# Patient Record
Sex: Female | Born: 1955 | Race: White | Hispanic: No | Marital: Married | Smoking: Current every day smoker
Health system: Southern US, Community
[De-identification: ages and names within clinical notes are randomized; demographics above are authoritative.]

## PROBLEM LIST (undated history)

## (undated) DIAGNOSIS — F329 Major depressive disorder, single episode, unspecified: Secondary | ICD-10-CM

## (undated) DIAGNOSIS — F32A Depression, unspecified: Secondary | ICD-10-CM

## (undated) DIAGNOSIS — J449 Chronic obstructive pulmonary disease, unspecified: Secondary | ICD-10-CM

## (undated) DIAGNOSIS — I10 Essential (primary) hypertension: Secondary | ICD-10-CM

## (undated) HISTORY — PX: TONSILLECTOMY: SUR1361

## (undated) HISTORY — PX: CHOLECYSTECTOMY: SHX55

---

## 1999-04-24 ENCOUNTER — Other Ambulatory Visit: Admission: RE | Admit: 1999-04-24 | Discharge: 1999-04-24 | Payer: Self-pay | Admitting: *Deleted

## 2000-03-23 ENCOUNTER — Other Ambulatory Visit: Admission: RE | Admit: 2000-03-23 | Discharge: 2000-03-23 | Payer: Self-pay | Admitting: *Deleted

## 2001-04-13 ENCOUNTER — Other Ambulatory Visit: Admission: RE | Admit: 2001-04-13 | Discharge: 2001-04-13 | Payer: Self-pay | Admitting: *Deleted

## 2002-08-22 ENCOUNTER — Emergency Department (HOSPITAL_COMMUNITY): Admission: EM | Admit: 2002-08-22 | Discharge: 2002-08-22 | Payer: Self-pay

## 2002-09-01 ENCOUNTER — Encounter: Payer: Self-pay | Admitting: *Deleted

## 2002-09-05 ENCOUNTER — Observation Stay (HOSPITAL_COMMUNITY): Admission: RE | Admit: 2002-09-05 | Discharge: 2002-09-05 | Payer: Self-pay | Admitting: *Deleted

## 2002-09-05 ENCOUNTER — Encounter: Payer: Self-pay | Admitting: *Deleted

## 2010-08-17 ENCOUNTER — Emergency Department (HOSPITAL_COMMUNITY)
Admission: EM | Admit: 2010-08-17 | Discharge: 2010-08-17 | Payer: Self-pay | Source: Home / Self Care | Admitting: Emergency Medicine

## 2016-06-28 ENCOUNTER — Ambulatory Visit (HOSPITAL_COMMUNITY)
Admission: EM | Admit: 2016-06-28 | Discharge: 2016-06-28 | Disposition: A | Discharge status: Home / Self Care | Payer: Self-pay | Attending: Family Medicine | Admitting: Family Medicine

## 2016-06-28 ENCOUNTER — Ambulatory Visit (INDEPENDENT_AMBULATORY_CARE_PROVIDER_SITE_OTHER): Payer: Self-pay

## 2016-06-28 ENCOUNTER — Encounter (HOSPITAL_COMMUNITY): Payer: Self-pay | Admitting: Emergency Medicine

## 2016-06-28 DIAGNOSIS — B9689 Other specified bacterial agents as the cause of diseases classified elsewhere: Secondary | ICD-10-CM

## 2016-06-28 DIAGNOSIS — J208 Acute bronchitis due to other specified organisms: Secondary | ICD-10-CM

## 2016-06-28 HISTORY — DX: Depression, unspecified: F32.A

## 2016-06-28 HISTORY — DX: Major depressive disorder, single episode, unspecified: F32.9

## 2016-06-28 MED ORDER — CEFUROXIME AXETIL 250 MG PO TABS: 250.0000 mg | ORAL_TABLET | Freq: Two times a day (BID) | ORAL | 0 refills | Status: DC

## 2016-06-28 NOTE — ED Triage Notes (Signed)
Woke Tuesday feeling terrible.  Patient reports sob, stuffy head, dizzy, nauseated, poor appetite.  Patient doubts she has had a fever

## 2016-06-28 NOTE — Discharge Instructions (Signed)
Take all of medicine, drink lots of fluids, no more smoking, see your doctor if further problems , use delsym and mucinex for cough.

## 2016-06-28 NOTE — ED Provider Notes (Signed)
[facility]    CSN: [id number] Arrival date & time: 06/28/16  1219     History   Chief Complaint Chief Complaint  Patient presents with  . URI    HPI Tammy Cowan is a 60 y.o. female.   The history is provided by the patient.  URI  Presenting symptoms: congestion, cough, rhinorrhea and sore throat   Presenting symptoms: no fever   Severity:  Moderate Onset quality:  Sudden Duration:  4 days Progression:  Unchanged Chronicity:  New Relieved by:  Nothing Exacerbated by: smoker 2 ppd. Ineffective treatments:  None tried   Past Medical History:  Diagnosis Date  . Depression     There are no active problems to display for this patient.   Past Surgical History:  Procedure Laterality Date  . CHOLECYSTECTOMY    . TONSILLECTOMY      OB History    No data available       Home Medications    Prior to Admission medications   Medication Sig Start Date End Date Taking? Authorizing Provider  guaiFENesin (MUCINEX) 600 MG 12 hr tablet Take by mouth 2 (two) times daily.   Yes Historical Provider, MD  cefUROXime (CEFTIN) 250 MG tablet Take 1 tablet (250 mg total) by mouth 2 (two) times daily. 06/28/16   Linna HoffJames D Shyna Duignan, MD    Family History History reviewed. No pertinent family history.  Social History Social History  Substance Use Topics  . Smoking status: Current Every Day Smoker  . Smokeless tobacco: Never Used  . Alcohol use Yes     Allergies   Patient has no known allergies.   Review of Systems Review of Systems  Constitutional: Positive for activity change and appetite change. Negative for fever.  HENT: Positive for congestion, postnasal drip, rhinorrhea and sore throat.   Respiratory: Positive for cough and shortness of breath.   Gastrointestinal: Positive for nausea.  Skin: Negative.      Physical Exam Triage Vital Signs ED Triage Vitals  Enc Vitals Group     BP 06/28/16 1254 (!) 156/108     Pulse Rate 06/28/16 1254 78   Resp 06/28/16 1254 (!) 28     Temp 06/28/16 1254 98.1 F (36.7 C)     Temp Source 06/28/16 1254 Oral     SpO2 06/28/16 1254 95 %     Weight --      Height --      Head Circumference --      Peak Flow --      Pain Score 06/28/16 1253 3     Pain Loc --      Pain Edu? --      Excl. in GC? --    No data found.   Updated Vital Signs BP (!) 156/108 (BP Location: Right Arm) Comment: regular cuff.  reports alot of stress in life  Pulse 78   Temp 98.1 F (36.7 C) (Oral)   Resp (!) 28   SpO2 95%   Visual Acuity Right Eye Distance:   Left Eye Distance:   Bilateral Distance:    Right Eye Near:   Left Eye Near:    Bilateral Near:     Physical Exam  Constitutional: She is oriented to person, place, and time. She appears well-developed and well-nourished. No distress.  HENT:  Right Ear: External ear normal.  Left Ear: External ear normal.  Nose: Nose normal.  Mouth/Throat: Oropharynx is clear and moist.  Neck: Normal range of motion.  Neck supple.  Cardiovascular: Normal rate, regular rhythm, normal heart sounds and intact distal pulses.   Pulmonary/Chest: She has wheezes.  Neurological: She is alert and oriented to person, place, and time.  Skin: Skin is warm and dry.  Nursing note and vitals reviewed.    UC Treatments / Results  Labs (all labs ordered are listed, but only abnormal results are displayed) Labs Reviewed - No data to display  EKG  EKG Interpretation None       Radiology Dg Chest 2 View  Result Date: 06/28/2016 CLINICAL DATA:  Cough, productive.  Shortness of breath. EXAM: CHEST  2 VIEW COMPARISON:  None. FINDINGS: Heart size and mediastinal contours are normal. Mildly coarsened interstitial markings. Lungs otherwise clear. No pleural effusion or pneumothorax seen. Osseous and soft tissue structures about the chest are unremarkable. IMPRESSION: No active cardiopulmonary disease. No evidence of pneumonia or pulmonary edema. Electronically Signed   By:  Bary RichardStan  Maynard M.D.   On: 06/28/2016 13:31   X-rays reviewed and report per radiologist.  Procedures Procedures (including critical care time)  Medications Ordered in UC Medications - No data to display   Initial Impression / Assessment and Plan / UC Course  I have reviewed the triage vital signs and the nursing notes.  Pertinent labs & imaging results that were available during my care of the patient were reviewed by me and considered in my medical decision making (see chart for details).  Clinical Course       Final Clinical Impressions(s) / UC Diagnoses   Final diagnoses:  Acute bronchitis, bacterial    New Prescriptions Discharge Medication List as of 06/28/2016  1:43 PM    START taking these medications   Details  cefUROXime (CEFTIN) 250 MG tablet Take 1 tablet (250 mg total) by mouth 2 (two) times daily., Starting Sat 06/28/2016, Print         Linna HoffJames D Kassidi Elza, MD 06/29/16 2042

## 2017-04-21 IMAGING — DX DG CHEST 2V
2 series · 2 of 2 positions shown · non-contrast
Comparison: None.

CLINICAL DATA: Cough, productive.  Shortness of breath.

EXAM:
CHEST  2 VIEW

[chest pa]
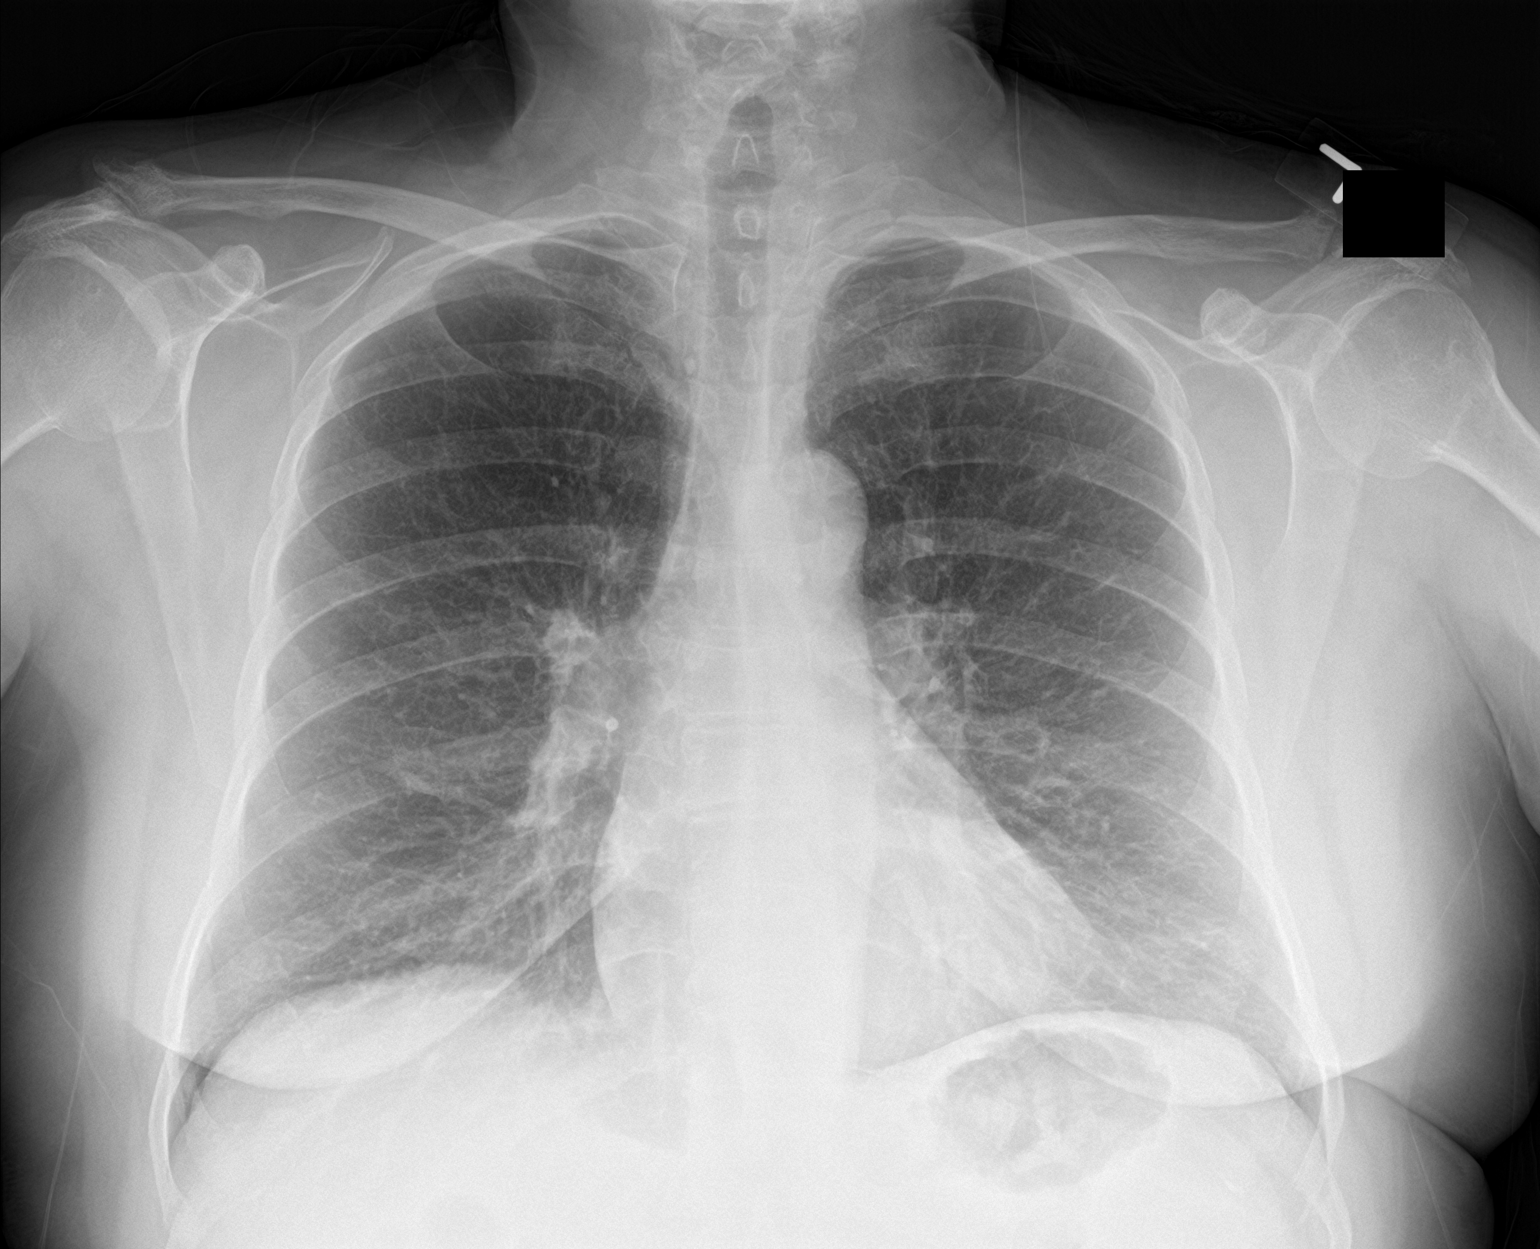

[chest lat]
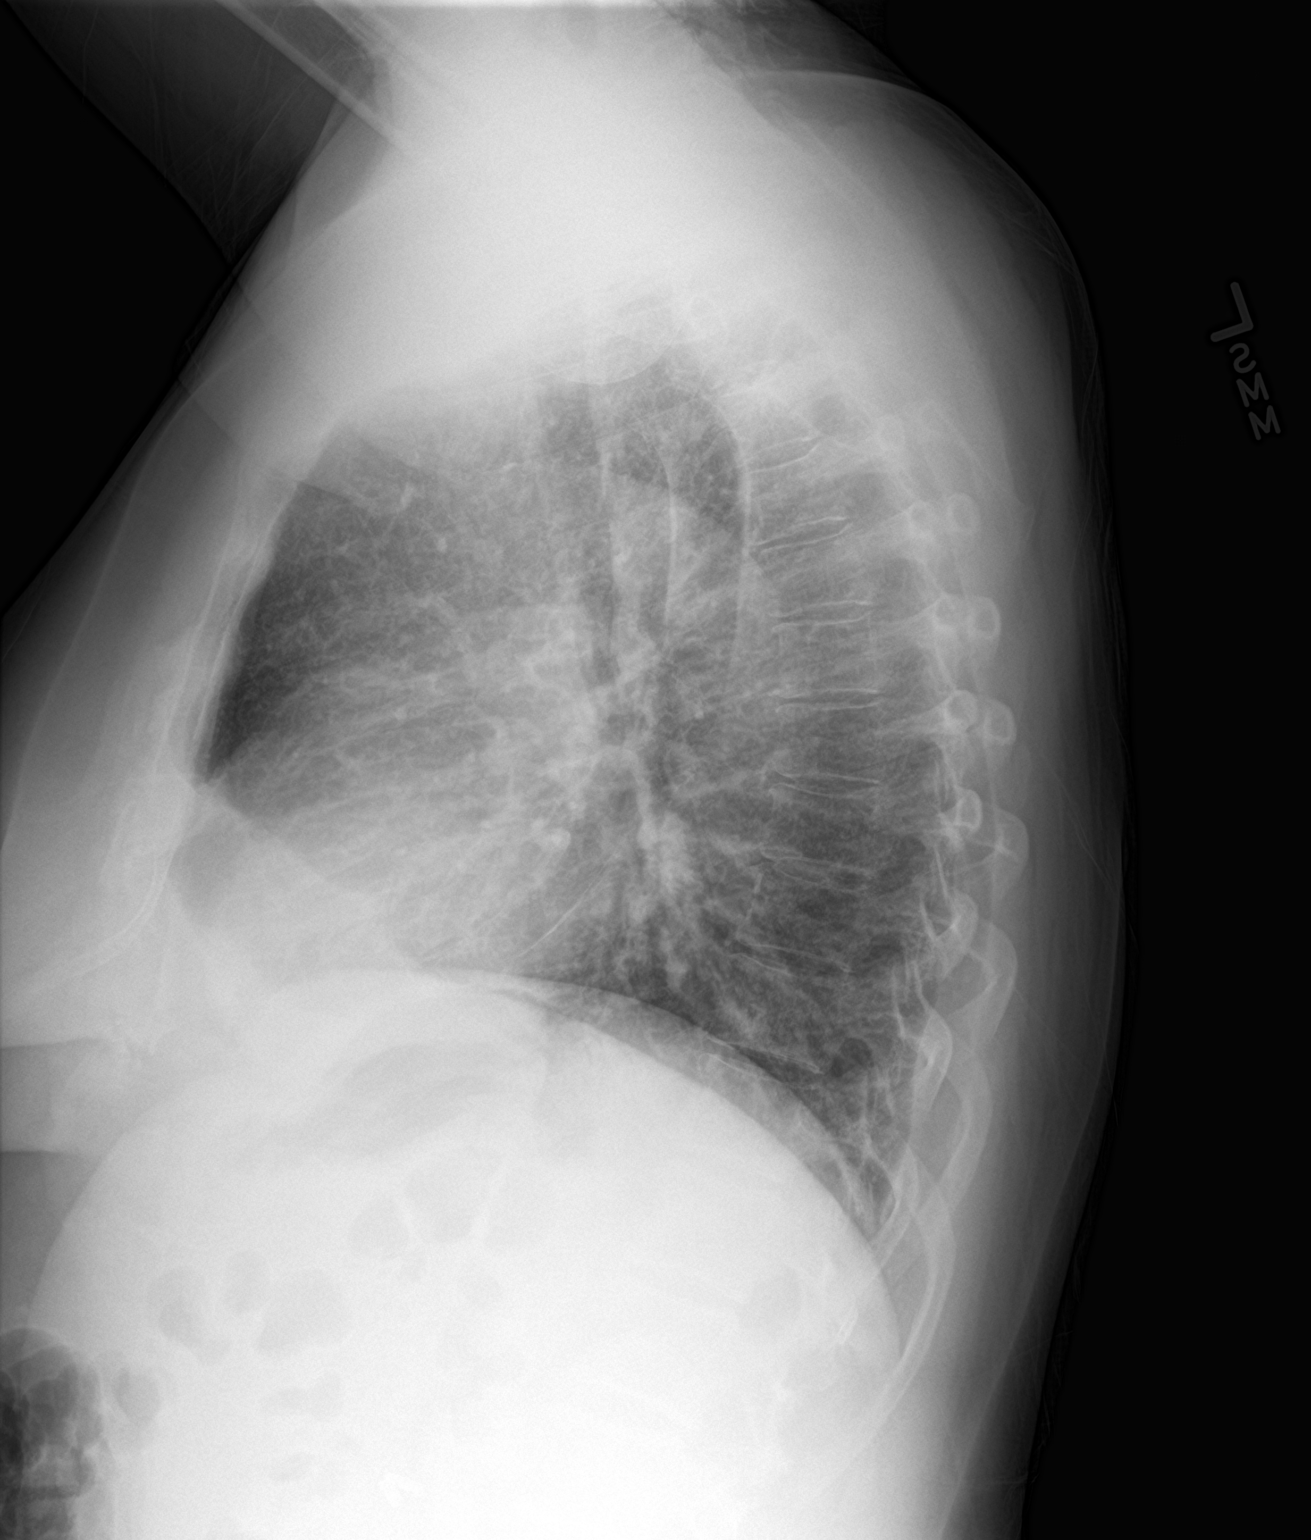

[2 of 2 positions shown; findings below may reference images not displayed]

FINDINGS: Heart size and mediastinal contours are normal. Mildly coarsened
interstitial markings. Lungs otherwise clear. No pleural effusion or
pneumothorax seen. Osseous and soft tissue structures about the
chest are unremarkable.
IMPRESSION: No active cardiopulmonary disease. No evidence of pneumonia or
pulmonary edema.

## 2018-04-21 ENCOUNTER — Encounter (HOSPITAL_COMMUNITY): Payer: Self-pay

## 2018-04-21 ENCOUNTER — Ambulatory Visit (HOSPITAL_COMMUNITY)
Admission: EM | Admit: 2018-04-21 | Discharge: 2018-04-21 | Disposition: A | Discharge status: Home / Self Care | Payer: Self-pay | Attending: Family Medicine | Admitting: Family Medicine

## 2018-04-21 DIAGNOSIS — J441 Chronic obstructive pulmonary disease with (acute) exacerbation: Secondary | ICD-10-CM

## 2018-04-21 DIAGNOSIS — J40 Bronchitis, not specified as acute or chronic: Secondary | ICD-10-CM

## 2018-04-21 MED ORDER — IPRATROPIUM-ALBUTEROL 0.5-2.5 (3) MG/3ML IN SOLN
3.0000 mL | Freq: Once | RESPIRATORY_TRACT | Status: AC
  Administered 2018-04-21: 3 mL via RESPIRATORY_TRACT

## 2018-04-21 MED ORDER — IPRATROPIUM-ALBUTEROL 0.5-2.5 (3) MG/3ML IN SOLN
RESPIRATORY_TRACT | Status: AC
  Filled 2018-04-21: qty 3

## 2018-04-21 MED ORDER — METHYLPREDNISOLONE SODIUM SUCC 125 MG IJ SOLR
INTRAMUSCULAR | Status: AC
  Filled 2018-04-21: qty 2

## 2018-04-21 MED ORDER — METHYLPREDNISOLONE SODIUM SUCC 125 MG IJ SOLR
80.0000 mg | Freq: Once | INTRAMUSCULAR | Status: AC
  Administered 2018-04-21: 80 mg via INTRAMUSCULAR

## 2018-04-21 MED ORDER — DOXYCYCLINE HYCLATE 100 MG PO CAPS: 100.0000 mg | ORAL_CAPSULE | Freq: Two times a day (BID) | ORAL | 0 refills | Status: DC

## 2018-04-21 MED ORDER — PREDNISONE 50 MG PO TABS: 50.0000 mg | ORAL_TABLET | Freq: Every day | ORAL | 0 refills | Status: DC

## 2018-04-21 NOTE — Discharge Instructions (Signed)
Start prednisone and doxycycline as directed. Continue albuterol as needed every 4-6 hours for shortness of breath, wheezing.  Keep hydrated, your urine should be clear to pale yellow in color. Tylenol/motrin for fever and pain. Monitor for any worsening of symptoms, chest pain, shortness of breath, wheezing, swelling of the throat, follow up for reevaluation.

## 2018-04-21 NOTE — ED Triage Notes (Signed)
Pt presents with ongoing persistent cough. 

## 2018-04-21 NOTE — ED Provider Notes (Signed)
[facility]    CSN: [id number] Arrival date & time: 04/21/18  1635     History   Chief Complaint Chief Complaint  Patient presents with  . Cough    HPI Tammy Cowan is a 62 y.o. female.   62 year old female for 4-day history of URI symptoms.  Productive cough, rhinorrhea, nasal congestion, frontal headache.  Sore throat and chest soreness from cough.  Denies fever, chills, night sweats.  Shortness of breath and wheezing that is slightly improved with albuterol inhaler.  Start taking anything else for the symptoms.  Current everyday smoker, 45-pack-year history.     Past Medical History:  Diagnosis Date  . Depression     There are no active problems to display for this patient.   Past Surgical History:  Procedure Laterality Date  . CHOLECYSTECTOMY    . TONSILLECTOMY      OB History   None      Home Medications    Prior to Admission medications   Medication Sig Start Date End Date Taking? Authorizing Provider  doxycycline (VIBRAMYCIN) 100 MG capsule Take 1 capsule (100 mg total) by mouth 2 (two) times daily. 04/21/18   Cathie HoopsYu, Amy V, PA-C  predniSONE (DELTASONE) 50 MG tablet Take 1 tablet (50 mg total) by mouth daily. 04/21/18   Belinda FisherYu, Amy V, PA-C    Family History History reviewed. No pertinent family history.  Social History Social History   Tobacco Use  . Smoking status: Current Every Day Smoker  . Smokeless tobacco: Never Used  Substance Use Topics  . Alcohol use: Yes  . Drug use: No     Allergies   Patient has no known allergies.   Review of Systems Review of Systems  Reason unable to perform ROS: See HPI as above.     Physical Exam Triage Vital Signs ED Triage Vitals [04/21/18 1642]  Enc Vitals Group     BP (!) 162/85     Pulse Rate 82     Resp 18     Temp 98 F (36.7 C)     Temp Source Temporal     SpO2 93 %     Weight      Height      Head Circumference      Peak Flow      Pain Score      Pain Loc      Pain Edu?       Excl. in GC?    No data found.  Updated Vital Signs BP (!) 162/85 (BP Location: Right Arm)   Pulse 82   Temp 98 F (36.7 C) (Temporal)   Resp 18   SpO2 93%   Physical Exam  Constitutional: She is oriented to person, place, and time. She appears well-developed and well-nourished. No distress.  HENT:  Head: Normocephalic and atraumatic.  Right Ear: Tympanic membrane, external ear and ear canal normal. Tympanic membrane is not erythematous and not bulging.  Left Ear: Tympanic membrane, external ear and ear canal normal. Tympanic membrane is not erythematous and not bulging.  Nose: Nose normal. Right sinus exhibits no maxillary sinus tenderness and no frontal sinus tenderness. Left sinus exhibits no maxillary sinus tenderness and no frontal sinus tenderness.  Mouth/Throat: Uvula is midline, oropharynx is clear and moist and mucous membranes are normal.  Eyes: Pupils are equal, round, and reactive to light. Conjunctivae are normal.  Neck: Normal range of motion. Neck supple.  Cardiovascular: Normal rate, regular rhythm and normal heart  sounds. Exam reveals no gallop and no friction rub.  No murmur heard. Pulmonary/Chest: Effort normal. No accessory muscle usage. No respiratory distress.  Patient talking in full sentences, no increased in effort of breathing.  Lungs with decreased air movement throughout with mild inspiratory and expiratory wheezing to the periphery.  Lymphadenopathy:    She has no cervical adenopathy.  Neurological: She is alert and oriented to person, place, and time.  Skin: Skin is warm and dry.  Psychiatric: She has a normal mood and affect. Her behavior is normal. Judgment normal.     UC Treatments / Results  Labs (all labs ordered are listed, but only abnormal results are displayed) Labs Reviewed - No data to display  EKG None  Radiology No results found.  Procedures Procedures (including critical care time)  Medications Ordered in  UC Medications  ipratropium-albuterol (DUONEB) 0.5-2.5 (3) MG/3ML nebulizer solution 3 mL (3 mLs Nebulization Given 04/21/18 1730)  ipratropium-albuterol (DUONEB) 0.5-2.5 (3) MG/3ML nebulizer solution 3 mL (3 mLs Nebulization Given 04/21/18 1756)  methylPREDNISolone sodium succinate (SOLU-MEDROL) 125 mg/2 mL injection 80 mg (80 mg Intramuscular Given 04/21/18 1756)    Initial Impression / Assessment and Plan / UC Course  I have reviewed the triage vital signs and the nursing notes.  Pertinent labs & imaging results that were available during my care of the patient were reviewed by me and considered in my medical decision making (see chart for details).    Patient with mild improvement of symptoms post DuoNeb x1.  Better air movement with increased inspiratory and expiratory wheezing.  O2 saturation improved to 95% on room air.  Patient with much improved symptoms post DuoNeb x2.  Lungs clear to auscultation bilaterally without adventitious lung sounds.  O2 saturation to 98% on room air.  Will treat for COPD exacerbation.  Solu-Medrol injection in office today.  Start doxycycline and prednisone as directed.  Continue albuterol as needed for shortness of breath and wheezing.  Push fluids.  Smoking cessation discussed.  Strict return precautions given.  Patient expresses understanding and agrees to plan.  Final Clinical Impressions(s) / UC Diagnoses   Final diagnoses:  Bronchitis    ED Prescriptions    Medication Sig Dispense Auth. Provider   predniSONE (DELTASONE) 50 MG tablet Take 1 tablet (50 mg total) by mouth daily. 5 tablet Yu, Amy V, PA-C   doxycycline (VIBRAMYCIN) 100 MG capsule Take 1 capsule (100 mg total) by mouth 2 (two) times daily. 20 capsule Threasa Alpha, [place] 04/21/18 [phone]

## 2018-08-03 ENCOUNTER — Encounter (HOSPITAL_COMMUNITY): Payer: Self-pay

## 2018-08-03 ENCOUNTER — Ambulatory Visit (HOSPITAL_COMMUNITY)
Admission: EM | Admit: 2018-08-03 | Discharge: 2018-08-03 | Disposition: A | Discharge status: Home / Self Care | Payer: BLUE CROSS/BLUE SHIELD | Attending: Family Medicine | Admitting: Family Medicine

## 2018-08-03 ENCOUNTER — Ambulatory Visit (INDEPENDENT_AMBULATORY_CARE_PROVIDER_SITE_OTHER): Payer: BLUE CROSS/BLUE SHIELD

## 2018-08-03 DIAGNOSIS — J40 Bronchitis, not specified as acute or chronic: Secondary | ICD-10-CM | POA: Insufficient documentation

## 2018-08-03 DIAGNOSIS — J441 Chronic obstructive pulmonary disease with (acute) exacerbation: Secondary | ICD-10-CM | POA: Insufficient documentation

## 2018-08-03 MED ORDER — ALBUTEROL SULFATE HFA 108 (90 BASE) MCG/ACT IN AERS
INHALATION_SPRAY | RESPIRATORY_TRACT | Status: AC
  Filled 2018-08-03: qty 6.7

## 2018-08-03 MED ORDER — AZITHROMYCIN 250 MG PO TABS: 250.0000 mg | ORAL_TABLET | Freq: Every day | ORAL | 0 refills | Status: DC

## 2018-08-03 MED ORDER — ALBUTEROL SULFATE HFA 108 (90 BASE) MCG/ACT IN AERS
2.0000 | INHALATION_SPRAY | Freq: Once | RESPIRATORY_TRACT | Status: AC
  Administered 2018-08-03: 2 via RESPIRATORY_TRACT

## 2018-08-03 MED ORDER — PREDNISONE 20 MG PO TABS: 20.0000 mg | ORAL_TABLET | Freq: Two times a day (BID) | ORAL | 0 refills | Status: AC

## 2018-08-03 NOTE — Discharge Instructions (Signed)
Declines duo-neb given in office Inhaler given in office for shortness of breath or wheezing Get plenty of rest and push fluids Prednisone prescribed.  Take as directed and to completion Azithromycin prescribed.  Take as directed and to completion [facility] and/or if symptoms persists Return or go to ER if you have any new or worsening symptoms such as fever, chills, fatigue, shortness of breath, wheezing, chest pain, nausea, changes in bowel or bladder habits, etc...Marland Kitchen

## 2018-08-03 NOTE — ED Provider Notes (Signed)
[facility]   [id number] 08/03/18 Arrival Time: 0807   CC: URI symptoms   SUBJECTIVE: History from: patient.  Royden PurlDenise C Kanouse is a 62 y.o. female who presents with  nasal congestion and productive cough x 1 week.  Admits to possible sick exposure at work.  Describes cough as intermittent and productive with yellow sputum.  Has tried breathing treatments and mucinex at home with minimal relief.  Symptoms are made worse at night.  Reports previous symptoms in the past and diagnosed with bronchitis. Complains of associated SOB and wheezing. Denies fever, chills, fatigue, sinus pain, rhinorrhea, sore throat, chest pain, nausea, changes in bowel or bladder habits.    Admits to tobacco use 0.5 - 1 PPD x 40+ years  ROS: As per HPI.  Past Medical History:  Diagnosis Date  . Depression    Past Surgical History:  Procedure Laterality Date  . CHOLECYSTECTOMY    . TONSILLECTOMY     No Known Allergies No current facility-administered medications on file prior to encounter.    No current outpatient medications on file prior to encounter.   Social History   Socioeconomic History  . Marital status: Single    Spouse name: Not on file  . Number of children: Not on file  . Years of education: Not on file  . Highest education level: Not on file  Occupational History  . Not on file  Social Needs  . Financial resource strain: Not on file  . Food insecurity:    Worry: Not on file    Inability: Not on file  . Transportation needs:    Medical: Not on file    Non-medical: Not on file  Tobacco Use  . Smoking status: Current Every Day Smoker  . Smokeless tobacco: Never Used  Substance and Sexual Activity  . Alcohol use: Yes  . Drug use: No  . Sexual activity: Not on file  Lifestyle  . Physical activity:    Days per week: Not on file    Minutes per session: Not on file  . Stress: Not on file  Relationships  . Social connections:    Talks on phone: Not on file    Gets  together: Not on file    Attends religious service: Not on file    Active member of club or organization: Not on file    Attends meetings of clubs or organizations: Not on file    Relationship status: Not on file  . Intimate partner violence:    Fear of current or ex partner: Not on file    Emotionally abused: Not on file    Physically abused: Not on file    Forced sexual activity: Not on file  Other Topics Concern  . Not on file  Social History Narrative  . Not on file   History reviewed. No pertinent family history.  OBJECTIVE:  Vitals:   08/03/18 0825  BP: (!) 158/96  Pulse: 85  Resp: 18  Temp: (!) 97.5 F (36.4 C)  TempSrc: Oral  SpO2: 99%     General appearance: alert; appears mildly fatigued, but nontoxic; speaking in full sentences and tolerating own secretions HEENT: NCAT; Ears: EACs clear, TMs pearly gray; Eyes: PERRL.  EOM grossly intact. Sinuses: nontender; Nose: nares patent without rhinorrhea, Throat: oropharynx clear, tonsils non erythematous or enlarged, uvula midline  Neck: supple without LAD Lungs: unlabored respirations, symmetrical air entry; cough: mild; no respiratory distress; CTAB; decreased breath sounds throughout Heart: regular rate and rhythm.  Radial pulses 2+ symmetrical bilaterally Skin: warm and dry Psychological: alert and cooperative; normal mood and affect  DIAGNOSTIC STUDIES:  Dg Chest 2 View  Result Date: 08/03/2018 CLINICAL DATA:  Cough, sore throat, headache and fatigue for 5 days. EXAM: CHEST - 2 VIEW COMPARISON:  06/28/2016 FINDINGS: The cardiac silhouette, mediastinal and hilar contours are normal in stable. There are mild chronic bronchitic type interstitial changes, likely related to smoking. Could not exclude the possibility of superimposed bronchitis. No infiltrates, edema or effusions. Streaky areas of bibasilar atelectasis. No worrisome pulmonary lesions. The bony thorax is intact. IMPRESSION: Chronic bronchitic type  interstitial changes, likely related to smoking. Could not exclude superimposed bronchitis but I do not see any infiltrates or effusions. Electronically Signed   By: Rudie MeyerP.  Gallerani M.D.   On: 08/03/2018 09:13     ASSESSMENT & PLAN:  1. Bronchitis   2. COPD exacerbation (HCC)     Meds ordered this encounter  Medications  . predniSONE (DELTASONE) 20 MG tablet    Sig: Take 1 tablet (20 mg total) by mouth 2 (two) times daily with a meal for 5 days.    Dispense:  10 tablet    Refill:  0    Order Specific Question:   Supervising Provider    Answer:   Eustace MooreNELSON, YVONNE SUE [[id number]]  . azithromycin (ZITHROMAX) 250 MG tablet    Sig: Take 1 tablet (250 mg total) by mouth daily. Take first 2 tablets together, then 1 every day until finished.    Dispense:  6 tablet    Refill:  0    Order Specific Question:   Supervising Provider    Answer:   Eustace MooreNELSON, YVONNE SUE [[id number]]  . albuterol (PROVENTIL HFA;VENTOLIN HFA) 108 (90 Base) MCG/ACT inhaler 2 puff   Declines duo-neb given in office.  Will do breathing treatment at home Inhaler given in office for shortness of breath or wheezing Get plenty of rest and push fluids Prednisone prescribed.  Take as directed and to completion Azithromycin prescribed.  Take as directed and to completion [facility]CP [facility] for rec[facility]ck and/or if symptoms persists Return or go to ER if you have any new or worsening symptoms such as fever, chills, fatigue, shortness of breath, wheezing, chest pain, nausea, changes in bowel or bladder habits, etc...    Reviewed expectations re: course of current medical issues. Questions answered. Outlined signs and symptoms indicating need for more acute intervention. Patient verbalized understanding. After Visit Summary given.         Rennis HardingWurst, Rosselyn Martha, PA-C 08/03/18 1011

## 2018-08-03 NOTE — ED Triage Notes (Signed)
Pt presents coughing and nasal congestion. Symptoms started 1 week ago.

## 2019-07-19 ENCOUNTER — Telehealth: Payer: Self-pay | Admitting: Oncology

## 2019-07-19 NOTE — Telephone Encounter (Signed)
A new patient appt has been scheduled for Tammy Cowan to see Dr. Alen Blew on 12/30 at 11am. Pt aware to arrive 15 minutes early.

## 2019-08-03 ENCOUNTER — Other Ambulatory Visit: Payer: Self-pay

## 2019-08-03 ENCOUNTER — Inpatient Hospital Stay: Payer: BC Managed Care – PPO | Attending: Oncology | Admitting: Oncology

## 2019-08-03 VITALS — BP 140/79 | HR 88 | Temp 98.0°F | Resp 18 | Ht 62.0 in | Wt 185.7 lb

## 2019-08-03 DIAGNOSIS — D751 Secondary polycythemia: Secondary | ICD-10-CM

## 2019-08-03 NOTE — Progress Notes (Signed)
Reason for the request:    Polycythemia  HPI: I was asked by Vicenta Aly, FNP for the evaluation of elevated ferritin and hemoglobin.  She is 63 year old woman with history of depression and hypertension who had a routine labs on November 2020.  At that time her hemoglobin was 17.9, white cell count was 8.5 and platelet count of 268.  Her differential was within normal range.  She had normal chemistries, liver function tests as well as kidney function test including BUN and creatinine.  Her ferritin was elevated 171 with the upper limit of normal of 150.  She reports being under a lot of stress for caring for her husband with dementia.  She does report smoking heavily close to 2 packs a day as well as heavy alcohol intake which she has cut down significantly at this time.  She denies any headaches chest pain.  She denies any thrombosis or bleeding episodes.  Her performance status and activity level remains reasonable at this time.  She continues to work full-time.    She does not report any headaches, blurry vision, syncope or seizures. Does not report any fevers, chills or sweats.  Does not report any cough, wheezing or hemoptysis.  Does not report any chest pain, palpitation, orthopnea or leg edema.  Does not report any nausea, vomiting or abdominal pain.  Does not report any constipation or diarrhea.  Does not report any skeletal complaints.    Does not report frequency, urgency or hematuria.  Does not report any skin rashes or lesions. Does not report any heat or cold intolerance.  Does not report any lymphadenopathy or petechiae.  Does not report any anxiety or depression.  Remaining review of systems is negative.    Past Medical History:  Diagnosis Date  . Depression   :  Past Surgical History:  Procedure Laterality Date  . CHOLECYSTECTOMY    . TONSILLECTOMY    :   Current Outpatient Medications:  .  azithromycin (ZITHROMAX) 250 MG tablet, Take 1 tablet (250 mg total) by mouth  daily. Take first 2 tablets together, then 1 every day until finished., Disp: 6 tablet, Rfl: 0:  No Known Allergies:  No family history on file.:  Social History   Socioeconomic History  . Marital status: Single    Spouse name: Not on file  . Number of children: Not on file  . Years of education: Not on file  . Highest education level: Not on file  Occupational History  . Not on file  Tobacco Use  . Smoking status: Current Every Day Smoker  . Smokeless tobacco: Never Used  Substance and Sexual Activity  . Alcohol use: Yes  . Drug use: No  . Sexual activity: Not on file  Other Topics Concern  . Not on file  Social History Narrative  . Not on file   Social Determinants of Health   Financial Resource Strain:   . Difficulty of Paying Living Expenses: Not on file  Food Insecurity:   . Worried About Charity fundraiser in the Last Year: Not on file  . Ran Out of Food in the Last Year: Not on file  Transportation Needs:   . Lack of Transportation (Medical): Not on file  . Lack of Transportation (Non-Medical): Not on file  Physical Activity:   . Days of Exercise per Week: Not on file  . Minutes of Exercise per Session: Not on file  Stress:   . Feeling of Stress : Not on file  Social Connections:   . Frequency of Communication with Friends and Family: Not on file  . Frequency of Social Gatherings with Friends and Family: Not on file  . Attends Religious Services: Not on file  . Active Member of Clubs or Organizations: Not on file  . Attends Banker Meetings: Not on file  . Marital Status: Not on file  Intimate Partner Violence:   . Fear of Current or Ex-Partner: Not on file  . Emotionally Abused: Not on file  . Physically Abused: Not on file  . Sexually Abused: Not on file  :  Pertinent items are noted in HPI.  Exam:  Blood pressure 140/79, pulse 88, temperature 98 F (36.7 C), temperature source Temporal, resp. rate 18, height 5\' 2"  (1.575 m), weight  185 lb 11.2 oz (84.2 kg), SpO2 97 %.   ECOG 1  General appearance: alert and cooperative appeared without distress. Head: atraumatic without any abnormalities. Eyes: conjunctivae/corneas clear. PERRL.  Sclera anicteric. Throat: lips, mucosa, and tongue normal; without oral thrush or ulcers. Resp: clear to auscultation bilaterally without rhonchi, wheezes or dullness to percussion. Cardio: regular rate and rhythm, S1, S2 normal, no murmur, click, rub or gallop GI: soft, non-tender; bowel sounds normal; no masses,  no organomegaly Skin: Skin color, texture, turgor normal. No rashes or lesions Lymph nodes: Cervical, supraclavicular, and axillary nodes normal. Neurologic: Grossly normal without any motor, sensory or deep tendon reflexes. Musculoskeletal: No joint deformity or effusion.     Assessment and Plan:    63 year old woman with:  1.  Polycythemia with elevated ferritin noted on laboratory testing in November 2020.  This is in the setting of polysubstance use including increased alcohol and tobacco use.  The differential diagnosis was discussed today at this time.  Polycythemia vera, hemochromatosis or other hematological condition are considered less likely in this particular setting.  Secondary causes for elevation of ferritin and hemoglobin likely related to tobacco and alcohol use at this time.  I have recommended abstaining from alcohol and decrease on smoking as much as possible before repeat evaluation.  Work-up for hemochromatosis and polycythemia vera would be reasonable if her counts continue to be elevated.  All her questions are answered to her satisfaction.  2.  Follow-up: Will be in 6 months for repeat evaluation.   40  minutes was spent with the patient face-to-face today.  More than 50% of time was spent on reviewing her disease status, treatment options and management of her condition including future work-up plans.   Thank you for the referral. A copy of this  consult has been forwarded to the requesting provider.

## 2019-08-04 ENCOUNTER — Telehealth: Payer: Self-pay | Admitting: Oncology

## 2019-08-04 NOTE — Telephone Encounter (Signed)
Scheduled appt per 12/30 los.  Sent a message to HIM pool to get a calendar mailed out. 

## 2020-01-27 ENCOUNTER — Other Ambulatory Visit: Payer: BC Managed Care – PPO

## 2020-02-01 ENCOUNTER — Ambulatory Visit: Payer: BC Managed Care – PPO | Admitting: Oncology

## 2020-03-08 ENCOUNTER — Other Ambulatory Visit: Payer: Self-pay | Admitting: Orthopedic Surgery

## 2020-03-08 ENCOUNTER — Other Ambulatory Visit: Payer: Self-pay

## 2020-03-08 ENCOUNTER — Encounter: Payer: Self-pay | Admitting: Neurology

## 2020-03-08 DIAGNOSIS — M5412 Radiculopathy, cervical region: Secondary | ICD-10-CM

## 2020-03-08 DIAGNOSIS — G5603 Carpal tunnel syndrome, bilateral upper limbs: Secondary | ICD-10-CM

## 2020-03-08 DIAGNOSIS — M931 Kienbock's disease of adults: Secondary | ICD-10-CM

## 2020-04-02 ENCOUNTER — Ambulatory Visit
Admission: RE | Admit: 2020-04-02 | Discharge: 2020-04-02 | Disposition: A | Discharge status: Home / Self Care | Payer: BC Managed Care – PPO | Source: Ambulatory Visit | Attending: Orthopedic Surgery | Admitting: Orthopedic Surgery

## 2020-04-02 ENCOUNTER — Other Ambulatory Visit: Payer: Self-pay

## 2020-04-02 DIAGNOSIS — M931 Kienbock's disease of adults: Secondary | ICD-10-CM

## 2020-04-03 ENCOUNTER — Ambulatory Visit (INDEPENDENT_AMBULATORY_CARE_PROVIDER_SITE_OTHER): Payer: BC Managed Care – PPO | Admitting: Neurology

## 2020-04-03 DIAGNOSIS — M5412 Radiculopathy, cervical region: Secondary | ICD-10-CM

## 2020-04-03 DIAGNOSIS — G5603 Carpal tunnel syndrome, bilateral upper limbs: Secondary | ICD-10-CM | POA: Diagnosis not present

## 2020-04-03 NOTE — Procedures (Signed)
[facility]  [address] [place], [address]  [place], [place] [address] Tel: [phone] Fax:  [phone] Test Date:  04/03/2020  Patient: Tammy Cowan DOB: 1956-03-04 Physician: Nita Sickle, DO  Sex: Female Height: 5\' 2"  Ref Phys:  ID#: Monica [id number] Temp: 33.0C Technician:    Patient Complaints: This is a 64 year old female referred for evaluation of bilateral hand paresthesias, worse on the right.  NCV & EMG Findings: Extensive electrodiagnostic testing of the right upper extremity and additional studies: 1. Bilateral median sensory responses are absent.  Bilateral ulnar sensory responses are within normal limits. 2. Median motor response is absent on the right and shows prolonged latency (8.0 ms) and reduced amplitude (4.6 mV) on the left.  Bilateral ulnar motor responses are within normal limits. 3. Despite maximal activation, no motor unit recruitment is seen in the right abductor pollicis brevis muscle and chronic motor axonal loss changes are seen on the left.  Impression: Bilateral median neuropathy at or distal to the wrist, consistent with a clinical diagnosis of carpal tunnel syndrome.  Overall, these findings are very severe in degree electrically and worse on the right.   ___________________________ 77, DO    Nerve Conduction Studies Anti Sensory Summary Table   Stim Site NR Peak (ms) Norm Peak (ms) P-T Amp (V) Norm P-T Amp  Left Median Anti Sensory (2nd Digit)  35C  Wrist NR  <3.8  >10  Right Median Anti Sensory (2nd Digit)  35C  Wrist NR  <3.8  >10  Left Ulnar Anti Sensory (5th Digit)  35C  Wrist    2.4 <3.2 28.4 >5  Right Ulnar Anti Sensory (5th Digit)  35C  Wrist    2.3 <3.2 29.3 >5   Motor Summary Table   Stim Site NR Onset (ms) Norm Onset (ms) O-P Amp (mV) Norm O-P Amp Site1 Site2 Delta-0 (ms) Dist (cm) Vel (m/s) Norm Vel (m/s)  Left Median Motor (Abd Poll Brev)  35C  Wrist    8.0 <4.0 4.6 >5 Elbow Wrist 5.0 26.0  52 >50  Elbow    13.0  4.6         Right Median Motor (Abd Poll Brev)  35C  Wrist NR  <4.0  >5 Elbow Wrist  0.0  >50  Elbow NR            Left Ulnar Motor (Abd Dig Minimi)  35C  Wrist    2.0 <3.1 11.3 >7 B Elbow Wrist 3.5 22.0 63 >50  B Elbow    5.5  10.0  A Elbow B Elbow 1.8 10.0 56 >50  A Elbow    7.3  9.5         Right Ulnar Motor (Abd Dig Minimi)  35C  Wrist    2.0 <3.1 9.7 >7 B Elbow Wrist 3.6 21.0 58 >50  B Elbow    5.6  9.1  A Elbow B Elbow 1.9 10.0 53 >50  A Elbow    7.5  8.4          EMG   Side Muscle Ins Act Fibs Psw Fasc Number Recrt Dur Dur. Amp Amp. Poly Poly. Comment  Right 1stDorInt Nml Nml Nml Nml Nml Nml Nml Nml Nml Nml Nml Nml N/A  Right Abd Poll Brev Nml Nml Nml Nml None None - - - - - - ATR  Right PronatorTeres Nml Nml Nml Nml Nml Nml Nml Nml Nml Nml Nml Nml N/A  Right Biceps Nml Nml Nml  Nml Nml Nml Nml Nml Nml Nml Nml Nml N/A  Right Triceps Nml Nml Nml Nml Nml Nml Nml Nml Nml Nml Nml Nml N/A  Right Deltoid Nml Nml Nml Nml Nml Nml Nml Nml Nml Nml Nml Nml N/A  Left 1stDorInt Nml Nml Nml Nml Nml Nml Nml Nml Nml Nml Nml Nml N/A  Left PronatorTeres Nml Nml Nml Nml Nml Nml Nml Nml Nml Nml Nml Nml N/A  Left Abd Poll Brev Nml Nml Nml Nml 1- Rapid Some 1+ Some 1+ Nml Nml N/A  Left Biceps Nml Nml Nml Nml Nml Nml Nml Nml Nml Nml Nml Nml N/A  Left Triceps Nml Nml Nml Nml Nml Nml Nml Nml Nml Nml Nml Nml N/A  Left Deltoid Nml Nml Nml Nml Nml Nml Nml Nml Nml Nml Nml Nml N/A      Waveforms:

## 2020-04-05 ENCOUNTER — Other Ambulatory Visit: Payer: Self-pay | Admitting: Orthopedic Surgery

## 2020-05-03 ENCOUNTER — Other Ambulatory Visit: Payer: Self-pay

## 2020-05-03 ENCOUNTER — Encounter (HOSPITAL_BASED_OUTPATIENT_CLINIC_OR_DEPARTMENT_OTHER): Payer: Self-pay | Admitting: Orthopedic Surgery

## 2020-05-07 ENCOUNTER — Other Ambulatory Visit (HOSPITAL_COMMUNITY): Payer: BC Managed Care – PPO

## 2020-05-07 ENCOUNTER — Encounter (HOSPITAL_BASED_OUTPATIENT_CLINIC_OR_DEPARTMENT_OTHER)
Admission: RE | Admit: 2020-05-07 | Discharge: 2020-05-07 | Disposition: A | Discharge status: Home / Self Care | Payer: BC Managed Care – PPO | Source: Ambulatory Visit | Attending: Orthopedic Surgery | Admitting: Orthopedic Surgery

## 2020-05-07 DIAGNOSIS — Z01812 Encounter for preprocedural laboratory examination: Secondary | ICD-10-CM | POA: Diagnosis present

## 2020-05-07 NOTE — Progress Notes (Signed)

## 2020-05-08 ENCOUNTER — Other Ambulatory Visit (HOSPITAL_COMMUNITY)
Admission: RE | Admit: 2020-05-08 | Discharge: 2020-05-08 | Disposition: A | Discharge status: Home / Self Care | Payer: BC Managed Care – PPO | Source: Ambulatory Visit | Attending: Orthopedic Surgery | Admitting: Orthopedic Surgery

## 2020-05-08 DIAGNOSIS — Z20822 Contact with and (suspected) exposure to covid-19: Secondary | ICD-10-CM | POA: Diagnosis not present

## 2020-05-08 DIAGNOSIS — Z01812 Encounter for preprocedural laboratory examination: Secondary | ICD-10-CM | POA: Diagnosis not present

## 2020-05-08 LAB — SARS CORONAVIRUS 2 (TAT 6-24 HRS): SARS Coronavirus 2: NEGATIVE

## 2020-05-10 ENCOUNTER — Ambulatory Visit (HOSPITAL_BASED_OUTPATIENT_CLINIC_OR_DEPARTMENT_OTHER): Payer: BC Managed Care – PPO | Admitting: Anesthesiology

## 2020-05-10 ENCOUNTER — Encounter (HOSPITAL_BASED_OUTPATIENT_CLINIC_OR_DEPARTMENT_OTHER): Payer: Self-pay | Admitting: Orthopedic Surgery

## 2020-05-10 ENCOUNTER — Other Ambulatory Visit: Payer: Self-pay

## 2020-05-10 ENCOUNTER — Ambulatory Visit (HOSPITAL_BASED_OUTPATIENT_CLINIC_OR_DEPARTMENT_OTHER)
Admission: RE | Admit: 2020-05-10 | Discharge: 2020-05-10 | Disposition: A | Discharge status: Home / Self Care | Payer: BC Managed Care – PPO | Attending: Orthopedic Surgery | Admitting: Orthopedic Surgery

## 2020-05-10 ENCOUNTER — Encounter (HOSPITAL_BASED_OUTPATIENT_CLINIC_OR_DEPARTMENT_OTHER)
Admission: RE | Disposition: A | Discharge status: Home / Self Care | Payer: Self-pay | Source: Home / Self Care | Attending: Orthopedic Surgery

## 2020-05-10 DIAGNOSIS — F32A Depression, unspecified: Secondary | ICD-10-CM | POA: Insufficient documentation

## 2020-05-10 DIAGNOSIS — G5601 Carpal tunnel syndrome, right upper limb: Secondary | ICD-10-CM | POA: Insufficient documentation

## 2020-05-10 DIAGNOSIS — I10 Essential (primary) hypertension: Secondary | ICD-10-CM | POA: Insufficient documentation

## 2020-05-10 DIAGNOSIS — F17210 Nicotine dependence, cigarettes, uncomplicated: Secondary | ICD-10-CM | POA: Insufficient documentation

## 2020-05-10 DIAGNOSIS — Z79899 Other long term (current) drug therapy: Secondary | ICD-10-CM | POA: Diagnosis not present

## 2020-05-10 DIAGNOSIS — M8568 Other cyst of bone, other site: Secondary | ICD-10-CM | POA: Diagnosis not present

## 2020-05-10 HISTORY — PX: CYST EXCISION: SHX5701

## 2020-05-10 HISTORY — PX: CARPAL TUNNEL RELEASE: SHX101

## 2020-05-10 HISTORY — DX: Essential (primary) hypertension: I10

## 2020-05-10 SURGERY — CARPAL TUNNEL RELEASE
Anesthesia: Regional | Site: Wrist | Laterality: Right

## 2020-05-10 MED ORDER — PROPOFOL 10 MG/ML IV BOLUS
INTRAVENOUS | Status: DC | PRN
  Administered 2020-05-10: 50 mg via INTRAVENOUS

## 2020-05-10 MED ORDER — DEXAMETHASONE SODIUM PHOSPHATE 10 MG/ML IJ SOLN
INTRAMUSCULAR | Status: AC
  Filled 2020-05-10: qty 1

## 2020-05-10 MED ORDER — DIPHENHYDRAMINE HCL 50 MG/ML IJ SOLN
INTRAMUSCULAR | Status: AC
  Filled 2020-05-10: qty 1

## 2020-05-10 MED ORDER — PROPOFOL 500 MG/50ML IV EMUL
INTRAVENOUS | Status: DC | PRN
  Administered 2020-05-10: 75 ug/kg/min via INTRAVENOUS

## 2020-05-10 MED ORDER — ACETAMINOPHEN 500 MG PO TABS
ORAL_TABLET | ORAL | Status: AC
  Filled 2020-05-10: qty 2

## 2020-05-10 MED ORDER — ONDANSETRON HCL 4 MG/2ML IJ SOLN
INTRAMUSCULAR | Status: DC | PRN
  Administered 2020-05-10: 4 mg via INTRAVENOUS

## 2020-05-10 MED ORDER — BUPIVACAINE HCL (PF) 0.25 % IJ SOLN
INTRAMUSCULAR | Status: AC
  Filled 2020-05-10: qty 60

## 2020-05-10 MED ORDER — HYDROMORPHONE HCL 1 MG/ML IJ SOLN
INTRAMUSCULAR | Status: AC
Start: 2020-05-10 — End: ?
  Filled 2020-05-10: qty 0.5

## 2020-05-10 MED ORDER — MIDAZOLAM HCL 2 MG/2ML IJ SOLN
INTRAMUSCULAR | Status: AC
  Filled 2020-05-10: qty 2

## 2020-05-10 MED ORDER — LACTATED RINGERS IV SOLN: INTRAVENOUS | Status: DC

## 2020-05-10 MED ORDER — HYDROMORPHONE HCL 1 MG/ML IJ SOLN
0.2500 mg | INTRAMUSCULAR | Status: DC | PRN
  Administered 2020-05-10: 0.5 mg via INTRAVENOUS

## 2020-05-10 MED ORDER — PHENYLEPHRINE 40 MCG/ML (10ML) SYRINGE FOR IV PUSH (FOR BLOOD PRESSURE SUPPORT)
PREFILLED_SYRINGE | INTRAVENOUS | Status: AC
  Filled 2020-05-10: qty 10

## 2020-05-10 MED ORDER — DIPHENHYDRAMINE HCL 50 MG/ML IJ SOLN
INTRAMUSCULAR | Status: DC | PRN
  Administered 2020-05-10: 12.5 mg via INTRAVENOUS

## 2020-05-10 MED ORDER — KETOROLAC TROMETHAMINE 30 MG/ML IJ SOLN
INTRAMUSCULAR | Status: AC
  Filled 2020-05-10: qty 1

## 2020-05-10 MED ORDER — IPRATROPIUM-ALBUTEROL 0.5-2.5 (3) MG/3ML IN SOLN
RESPIRATORY_TRACT | Status: AC
  Filled 2020-05-10: qty 3

## 2020-05-10 MED ORDER — FENTANYL CITRATE (PF) 100 MCG/2ML IJ SOLN
INTRAMUSCULAR | Status: AC
  Filled 2020-05-10: qty 2

## 2020-05-10 MED ORDER — OXYCODONE HCL 5 MG/5ML PO SOLN: 5.0000 mg | Freq: Once | ORAL | Status: DC | PRN

## 2020-05-10 MED ORDER — FENTANYL CITRATE (PF) 100 MCG/2ML IJ SOLN
INTRAMUSCULAR | Status: DC | PRN
Start: 2020-05-10 — End: 2020-05-10
  Administered 2020-05-10 (×2): 25 ug via INTRAVENOUS

## 2020-05-10 MED ORDER — LIDOCAINE 2% (20 MG/ML) 5 ML SYRINGE
INTRAMUSCULAR | Status: AC
  Filled 2020-05-10: qty 5

## 2020-05-10 MED ORDER — MIDAZOLAM HCL 2 MG/2ML IJ SOLN
1.0000 mg | Freq: Once | INTRAMUSCULAR | Status: AC
  Administered 2020-05-10: 2 mg via INTRAVENOUS

## 2020-05-10 MED ORDER — ACETAMINOPHEN 500 MG PO TABS
1000.0000 mg | ORAL_TABLET | Freq: Once | ORAL | Status: AC
  Administered 2020-05-10: 1000 mg via ORAL

## 2020-05-10 MED ORDER — PROMETHAZINE HCL 25 MG/ML IJ SOLN: 6.2500 mg | INTRAMUSCULAR | Status: DC | PRN

## 2020-05-10 MED ORDER — OXYCODONE HCL 5 MG PO TABS: 5.0000 mg | ORAL_TABLET | Freq: Once | ORAL | Status: DC | PRN

## 2020-05-10 MED ORDER — ONDANSETRON HCL 4 MG/2ML IJ SOLN
INTRAMUSCULAR | Status: AC
  Filled 2020-05-10: qty 2

## 2020-05-10 MED ORDER — KETOROLAC TROMETHAMINE 30 MG/ML IJ SOLN
30.0000 mg | Freq: Once | INTRAMUSCULAR | Status: AC | PRN
  Administered 2020-05-10: 30 mg via INTRAVENOUS

## 2020-05-10 MED ORDER — HYDROCODONE-ACETAMINOPHEN 5-325 MG PO TABS
ORAL_TABLET | ORAL | 0 refills | Status: DC
Start: 2020-05-10 — End: 2020-07-19

## 2020-05-10 MED ORDER — IPRATROPIUM-ALBUTEROL 0.5-2.5 (3) MG/3ML IN SOLN
3.0000 mL | RESPIRATORY_TRACT | Status: DC
  Administered 2020-05-10: 3 mL via RESPIRATORY_TRACT

## 2020-05-10 MED ORDER — CEFAZOLIN SODIUM-DEXTROSE 2-4 GM/100ML-% IV SOLN
INTRAVENOUS | Status: AC
  Filled 2020-05-10: qty 100

## 2020-05-10 MED ORDER — EPHEDRINE 5 MG/ML INJ
INTRAVENOUS | Status: AC
  Filled 2020-05-10: qty 10

## 2020-05-10 MED ORDER — SUCCINYLCHOLINE CHLORIDE 200 MG/10ML IV SOSY
PREFILLED_SYRINGE | INTRAVENOUS | Status: AC
  Filled 2020-05-10: qty 10

## 2020-05-10 MED ORDER — DEXAMETHASONE SODIUM PHOSPHATE 10 MG/ML IJ SOLN
INTRAMUSCULAR | Status: DC | PRN
  Administered 2020-05-10: 5 mg via INTRAVENOUS

## 2020-05-10 MED ORDER — LIDOCAINE-EPINEPHRINE (PF) 1.5 %-1:200000 IJ SOLN
INTRAMUSCULAR | Status: DC | PRN
  Administered 2020-05-10: 30 mL via PERINEURAL

## 2020-05-10 MED ORDER — CEFAZOLIN SODIUM-DEXTROSE 2-4 GM/100ML-% IV SOLN
2.0000 g | INTRAVENOUS | Status: AC
  Administered 2020-05-10: 2 g via INTRAVENOUS

## 2020-05-10 SURGICAL SUPPLY — 59 items
APL PRP STRL LF DISP 70% ISPRP (MISCELLANEOUS) ×1
APL SKNCLS STERI-STRIP NONHPOA (GAUZE/BANDAGES/DRESSINGS)
BENZOIN TINCTURE PRP APPL 2/3 (GAUZE/BANDAGES/DRESSINGS) IMPLANT
BLADE MINI RND TIP GREEN BEAV (BLADE) IMPLANT
BLADE SURG 15 STRL LF DISP TIS (BLADE) ×2 IMPLANT
BLADE SURG 15 STRL SS (BLADE) ×4
BNDG CMPR 9X4 STRL LF SNTH (GAUZE/BANDAGES/DRESSINGS) ×1
BNDG COHESIVE 1X5 TAN STRL LF (GAUZE/BANDAGES/DRESSINGS) IMPLANT
BNDG COHESIVE 2X5 TAN STRL LF (GAUZE/BANDAGES/DRESSINGS) IMPLANT
BNDG CONFORM 2 STRL LF (GAUZE/BANDAGES/DRESSINGS) IMPLANT
BNDG ELASTIC 2X5.8 VLCR STR LF (GAUZE/BANDAGES/DRESSINGS) IMPLANT
BNDG ELASTIC 3X5.8 VLCR STR LF (GAUZE/BANDAGES/DRESSINGS) ×2 IMPLANT
BNDG ESMARK 4X9 LF (GAUZE/BANDAGES/DRESSINGS) ×2 IMPLANT
BNDG GAUZE 1X2.1 STRL (MISCELLANEOUS) IMPLANT
BNDG GAUZE ELAST 4 BULKY (GAUZE/BANDAGES/DRESSINGS) ×2 IMPLANT
BNDG PLASTER X FAST 3X3 WHT LF (CAST SUPPLIES) IMPLANT
BNDG PLSTR 9X3 FST ST WHT (CAST SUPPLIES)
BONE CHIP PRESERV 5CC PCAN5 (Bone Implant) ×2 IMPLANT
CHLORAPREP W/TINT 26 (MISCELLANEOUS) ×2 IMPLANT
CORD BIPOLAR FORCEPS 12FT (ELECTRODE) ×2 IMPLANT
COVER BACK TABLE 60X90IN (DRAPES) ×2 IMPLANT
COVER MAYO STAND STRL (DRAPES) ×2 IMPLANT
COVER WAND RF STERILE (DRAPES) IMPLANT
CUFF TOURN SGL QUICK 18X4 (TOURNIQUET CUFF) ×2 IMPLANT
DRAPE EXTREMITY T 121X128X90 (DISPOSABLE) ×2 IMPLANT
DRAPE SURG 17X23 STRL (DRAPES) ×2 IMPLANT
DRSG PAD ABDOMINAL 8X10 ST (GAUZE/BANDAGES/DRESSINGS) ×2 IMPLANT
GAUZE SPONGE 4X4 12PLY STRL (GAUZE/BANDAGES/DRESSINGS) ×2 IMPLANT
GAUZE XEROFORM 1X8 LF (GAUZE/BANDAGES/DRESSINGS) ×2 IMPLANT
GLOVE BIO SURGEON STRL SZ 6.5 (GLOVE) ×4 IMPLANT
GLOVE BIO SURGEON STRL SZ7.5 (GLOVE) ×2 IMPLANT
GLOVE BIOGEL PI IND STRL 7.0 (GLOVE) ×3 IMPLANT
GLOVE BIOGEL PI IND STRL 8 (GLOVE) ×1 IMPLANT
GLOVE BIOGEL PI IND STRL 8.5 (GLOVE) ×1 IMPLANT
GLOVE BIOGEL PI INDICATOR 7.0 (GLOVE) ×3
GLOVE BIOGEL PI INDICATOR 8 (GLOVE) ×1
GLOVE BIOGEL PI INDICATOR 8.5 (GLOVE) ×1
GLOVE SURG ORTHO 8.0 STRL STRW (GLOVE) ×2 IMPLANT
GOWN STRL REUS W/ TWL LRG LVL3 (GOWN DISPOSABLE) ×1 IMPLANT
GOWN STRL REUS W/TWL LRG LVL3 (GOWN DISPOSABLE) ×2
GOWN STRL REUS W/TWL XL LVL3 (GOWN DISPOSABLE) ×4 IMPLANT
NEEDLE HYPO 25X1 1.5 SAFETY (NEEDLE) ×2 IMPLANT
NS IRRIG 1000ML POUR BTL (IV SOLUTION) ×2 IMPLANT
PACK BASIN DAY SURGERY FS (CUSTOM PROCEDURE TRAY) ×2 IMPLANT
PAD CAST 3X4 CTTN HI CHSV (CAST SUPPLIES) IMPLANT
PAD CAST 4YDX4 CTTN HI CHSV (CAST SUPPLIES) IMPLANT
PADDING CAST ABS 4INX4YD NS (CAST SUPPLIES) ×1
PADDING CAST ABS COTTON 4X4 ST (CAST SUPPLIES) ×1 IMPLANT
PADDING CAST COTTON 3X4 STRL (CAST SUPPLIES)
PADDING CAST COTTON 4X4 STRL (CAST SUPPLIES)
SLING ARM FOAM STRAP LRG (SOFTGOODS) ×2 IMPLANT
STOCKINETTE 4X48 STRL (DRAPES) ×2 IMPLANT
STRIP CLOSURE SKIN 1/2X4 (GAUZE/BANDAGES/DRESSINGS) IMPLANT
SUT ETHILON 3 0 PS 1 (SUTURE) IMPLANT
SUT ETHILON 4 0 PS 2 18 (SUTURE) ×2 IMPLANT
SYR BULB EAR ULCER 3OZ GRN STR (SYRINGE) ×2 IMPLANT
SYR CONTROL 10ML LL (SYRINGE) ×2 IMPLANT
TOWEL GREEN STERILE FF (TOWEL DISPOSABLE) ×4 IMPLANT
UNDERPAD 30X36 HEAVY ABSORB (UNDERPADS AND DIAPERS) ×2 IMPLANT

## 2020-05-10 NOTE — Anesthesia Procedure Notes (Signed)
Anesthesia Regional Block: Supraclavicular block   Pre-Anesthetic Checklist: ,, timeout performed, Correct Patient, Correct Site, Correct Laterality, Correct Procedure, Correct Position, site marked, Risks and benefits discussed,  Surgical consent,  Pre-op evaluation,  At surgeon's request and post-op pain management  Laterality: Right  Prep: chloraprep       Needles:  Injection technique: Single-shot  Needle Type: Echogenic Stimulator Needle     Needle Length: 10cm  Needle Gauge: 20     Additional Needles:   Procedures:,,,, ultrasound used (permanent image in chart),,,,  Narrative:  Start time: 05/10/2020 10:10 AM End time: 05/10/2020 10:20 AM Injection made incrementally with aspirations every 5 mL.  Performed by: Personally  Anesthesiologist: Leonides Grills, MD  Additional Notes: Functioning IV was confirmed and monitors were applied.  A timeout was performed. Sterile prep, hand hygiene and sterile gloves were used. A 20ga BBraun echogenic stimulator needle was used. Negative aspiration and negative test dose prior to incremental administration of local anesthetic. The patient tolerated the procedure well.  Ultrasound guidance: relevent anatomy identified, needle position confirmed, local anesthetic spread visualized around nerve(s), vascular puncture avoided.  Image printed for medical record.

## 2020-05-10 NOTE — Op Note (Signed)
I assisted Surgeon(s) and Role:    * Betha Loa, MD - Primary    Cindee Salt, MD - Assisting on the Procedure(s): CARPAL TUNNEL RELEASE RIGHT LUNATE CURRETAGE CYST AND BONE GRAFTING  ALLOGRAFT on 05/10/2020.  I provided assistance on this case as follows: setup, approach, decompression median nerve, isolation lunate bone, removal of lesion, preparation and placement of the bone graft, closure and application of the dressing and splint.  Electronically signed by: Cindee Salt, MD Date: 05/10/2020 Time: 12:09 PM

## 2020-05-10 NOTE — Discharge Instructions (Addendum)

## 2020-05-10 NOTE — Anesthesia Procedure Notes (Signed)
Procedure Name: LMA Insertion Date/Time: 05/10/2020 10:58 AM Performed by: Ronnette Hila, CRNA Pre-anesthesia Checklist: Patient identified, Emergency Drugs available, Suction available and Patient being monitored Patient Re-evaluated:Patient Re-evaluated prior to induction Oxygen Delivery Method: Circle system utilized Preoxygenation: Pre-oxygenation with 100% oxygen Induction Type: IV induction Ventilation: Mask ventilation without difficulty LMA: LMA inserted LMA Size: 4.0 Number of attempts: 1 Airway Equipment and Method: Bite block Placement Confirmation: positive ETCO2 Tube secured with: Tape Dental Injury: Teeth and Oropharynx as per pre-operative assessment

## 2020-05-10 NOTE — Anesthesia Postprocedure Evaluation (Signed)
Anesthesia Post Note  Patient: Tammy Cowan  Procedure(s) Performed: CARPAL TUNNEL RELEASE (Right Wrist) RIGHT LUNATE CURRETAGE CYST AND BONE GRAFTING  ALLOGRAFT (Right Wrist)     Patient location during evaluation: PACU Anesthesia Type: Regional Level of consciousness: awake and alert Pain management: pain level controlled Vital Signs Assessment: post-procedure vital signs reviewed and stable Respiratory status: spontaneous breathing, nonlabored ventilation and respiratory function stable Cardiovascular status: blood pressure returned to baseline and stable Postop Assessment: no apparent nausea or vomiting Anesthetic complications: no   No complications documented.  Last Vitals:  Vitals:   05/10/20 1400 05/10/20 1415  BP:    Pulse: 67 68  Resp: 16 16  Temp:    SpO2: 96% 93%    Last Pain:  Vitals:   05/10/20 1244  TempSrc:   PainSc: Asleep                 Lowella Curb

## 2020-05-10 NOTE — Transfer of Care (Signed)
Immediate Anesthesia Transfer of Care Note  Patient: Tammy Cowan  Procedure(s) Performed: CARPAL TUNNEL RELEASE (Right Wrist) RIGHT LUNATE CURRETAGE CYST AND BONE GRAFTING  ALLOGRAFT (Right Wrist)  Patient Location: PACU  Anesthesia Type:GA combined with regional for post-op pain  Level of Consciousness: awake, alert , oriented, drowsy and patient cooperative  Airway & Oxygen Therapy: Patient Spontanous Breathing and Patient connected to face mask oxygen  Post-op Assessment: Report given to RN and Post -op Vital signs reviewed and stable  Post vital signs: Reviewed and stable  Last Vitals:  Vitals Value Taken Time  BP    Temp    Pulse 85 05/10/20 1218  Resp 16 05/10/20 1218  SpO2 97 % 05/10/20 1218  Vitals shown include unvalidated device data.  Last Pain:  Vitals:   05/10/20 0914  TempSrc: Oral  PainSc: 0-No pain         Complications: No complications documented.

## 2020-05-10 NOTE — Progress Notes (Signed)
Post-nebulizer, patient's expiratory wheezing has improved. Over the course of 30 minutes, patient's oxygen saturation dropped to 88% only for a few seconds and maintained 94-100% the remainder of the time. Patient states she does not feel short of breath and would like to go home. Patient and patient's daughter are comfortable with discharge home. Dr. Hyacinth Meeker made aware and states patient can be discharged home.

## 2020-05-10 NOTE — Op Note (Signed)
NAME: Tammy Cowan MEDICAL RECORD NO: [id number] DATE OF BIRTH: 07-15-1956 FACILITY: [facility] LOCATION: [facility] PHYSICIAN: Tami Ribas, MD   OPERATIVE REPORT   DATE OF PROCEDURE: 05/10/20    PREOPERATIVE DIAGNOSIS:   Right carpal tunnel syndrome, lunate cyst   POSTOPERATIVE DIAGNOSIS:   Right carpal tunnel syndrome, lunate cyst   PROCEDURE:   1.  Right carpal tunnel release 2.  Curettage of lunate cyst with cancellous bone grafting.   SURGEON:  Betha Loa, M.D.   ASSISTANT: Cindee Salt, MD   ANESTHESIA:  General with regional   INTRAVENOUS FLUIDS:  Per anesthesia flow sheet.   ESTIMATED BLOOD LOSS:  Minimal.   COMPLICATIONS:  None.   SPECIMENS:   Lunate cyst to pathology   TOURNIQUET TIME:    Total Tourniquet Time Documented: Upper Arm (Right) - 70 minutes Total: Upper Arm (Right) - 70 minutes    DISPOSITION:  Stable to PACU.   INDICATIONS: 64 year old female with numbness and tingling right hand.  Radiographs show a cyst of the lunate.  She wished to proceed with right carpal tunnel release and curettage and grafting of the lunate cyst. Risks, benefits and alternatives of surgery were discussed including the risks of blood loss, infection, damage to nerves, vessels, tendons, ligaments, bone for surgery, need for additional surgery, complications with wound healing, continued pain, stiffness.  She voiced understanding of these risks and elected to proceed.  OPERATIVE COURSE:  After being identified preoperatively by myself,  the patient and I agreed on the procedure and site of the procedure.  The surgical site was marked.  Surgical consent had been signed. She was given IV antibiotics as preoperative antibiotic prophylaxis. She was transferred to the operating room and placed on the operating table in supine position with the Right Right upper extremity on an arm board.  A regional block had been performed by anesthesia in preoperative holding.    This was converted to general in the operating room.  Right upper extremity was prepped and draped in normal sterile orthopedic fashion.  A surgical pause was performed between the surgeons, anesthesia, and operating room staff and all were in agreement as to the patient, procedure, and site of procedure.  Tourniquet at the proximal aspect of the extremity was inflated to 250 mmHg after exsanguination of the arm with an Esmarch bandage.    Incision was made at the volar aspect of the palm over the transverse carpal ligament and into the distal forearm.  This was carried in subcutaneous tissues by spreading technique.  Bipolar electrocautery is used to obtain hemostasis.  The median nerve was identified in the distal forearm.  The fascia over top was sharply released.  The palmar cutaneous branch of the nerve was identified and protected throughout the case.  Palmer's longus was retracted radially.  The transverse carpal ligament was sharply incised in its entirety.  The nerve was inspected.  There was an hourglass deformity.  The motor branch was identified and was intact.  The median nerve was then retracted carefully radially and the flexor tendons retracted ulnarly.  The volar aspect of the wrist capsule was identified.  A 25-gauge needle was used to localize the lunate.  C-arm was used to help with localization.  Incision was then made over top of the lunate through the capsule.  The lunate was then entered with the Kleinert Coots to make a small hole.  The cavity was then debrided with the curette.  There was what  appeared to be a cystic wall but also some yellowish material.  This was all sent to pathology for examination.  The cavity was fully curettaged.  A radiodense marker from the sponge was placed into the cavity to visualize it under C arm to ensure complete curettage of been performed which was the case.  The cavity was then copiously irrigated with sterile saline.  It was packed with cancellous bone  graft.  C-arm was used in AP and lateral projections.  There is good bony density of the lunate.  The wound was copiously irrigated with sterile saline.  It was closed with 4-0 nylon in a horizontal mattress fashion.  It was dressed with sterile Xeroform 4 x 4's and wrapped with a Kerlix bandage.  Volar splints placed and wrapped with Kerlix and Ace bandage.  The tourniquet was deflated at 70 minutes.  Fingertips were pink with brisk capillary refill after deflation of tourniquet.  The operative  drapes were broken down.  The patient was awoken from anesthesia safely.  She was transferred back to the stretcher and taken to PACU in stable condition.  I will see her back in the office in 1 week for postoperative followup.  I will give her a prescription for Norco 5/325 1-2 tabs PO q6 hours prn pain, dispense # 30.   Betha Loa, MD Electronically signed, 05/10/20

## 2020-05-10 NOTE — Progress Notes (Signed)
Assisted Dr. Ellender with right, ultrasound guided, supraclavicular block. Side rails up, monitors on throughout procedure. See vital signs in flow sheet. Tolerated Procedure well. 

## 2020-05-10 NOTE — H&P (Addendum)
Tammy Cowan is an 64 y.o. female.   Chief Complaint: right carpal tunnel syndrome and lunate cyst HPI: 65 yo female with numbness and tingling right hand and cyst in lunate.  Positive nerve conduction studies.  She wishes to have a carpal tunnel release and bone grafting of lunate cyst.  Allergies: No Known Allergies  Past Medical History:  Diagnosis Date  . Depression   . Hypertension     Past Surgical History:  Procedure Laterality Date  . CHOLECYSTECTOMY    . TONSILLECTOMY      Family History: History reviewed. No pertinent family history.  Social History:   reports that she has been smoking cigarettes. She has been smoking about 1.50 packs per day. She has never used smokeless tobacco. She reports current alcohol use. She reports that she does not use drugs.  Medications: Medications Prior to Admission  Medication Sig Dispense Refill  . Biotin 1 MG CAPS Take by mouth.    . calcium carbonate (OS-CAL - DOSED IN MG OF ELEMENTAL CALCIUM) 1250 (500 Ca) MG tablet Take 1 tablet by mouth.    . cholecalciferol (VITAMIN D3) 25 MCG (1000 UNIT) tablet Take 1,000 Units by mouth daily.    . sertraline (ZOLOFT) 100 MG tablet Take 100 mg by mouth daily.    . valsartan (DIOVAN) 80 MG tablet Take 80 mg by mouth daily.    . vitamin B-12 (CYANOCOBALAMIN) 100 MCG tablet Take 100 mcg by mouth daily.      No results found for this or any previous visit (from the past 48 hour(s)).  No results found.   A comprehensive review of systems was negative.  Blood pressure (!) 143/79, pulse 74, temperature 97.8 F (36.6 C), temperature source Oral, resp. rate 16, height 5' 2.5" (1.588 m), weight 86.2 kg, SpO2 96 %.  General appearance: alert, cooperative and appears stated age Head: Normocephalic, without obvious abnormality, atraumatic Neck: supple, symmetrical, trachea midline Cardio: regular rate and rhythm Resp: clear to auscultation bilaterally Extremities: Intact sensation and  capillary refill all digits.  +epl/fpl/io.  No wounds.  Pulses: 2+ and symmetric Skin: Skin color, texture, turgor normal. No rashes or lesions Neurologic: Grossly normal Incision/Wound: none  Assessment/Plan Right carpal tunnel syndrome and lunate cyst.  She wishes to proceed with right carpal tunnel release and bone grafting of the lunate cyst.  Non operative and operative treatment options have been discussed with the patient and patient wishes to proceed with operative treatment. Risks, benefits, and alternatives of surgery have been discussed and the patient agrees with the plan of care. We also discussed possible need for bone graft and the low but possible risk of disease transmission such as HIV or hepatitis.  She voiced understanding of these risks and elected to proceed.   Betha Loa 05/10/2020, 10:16 AM

## 2020-05-10 NOTE — Anesthesia Preprocedure Evaluation (Addendum)
Anesthesia Evaluation  Patient identified by MRN, date of birth, ID band Patient awake    Reviewed: Allergy & Precautions, NPO status , Patient's Chart, lab work & pertinent test results  Airway Mallampati: III  TM Distance: >3 FB Neck ROM: Full    Dental  (+) Upper Dentures   Pulmonary Current Smoker and Patient abstained from smoking.,    Pulmonary exam normal breath sounds clear to auscultation       Cardiovascular hypertension, Pt. on medications Normal cardiovascular exam Rhythm:Regular Rate:Normal  ECG: NSR, rate 71   Neuro/Psych PSYCHIATRIC DISORDERS Depression negative neurological ROS     GI/Hepatic negative GI ROS, Neg liver ROS,   Endo/Other  negative endocrine ROS  Renal/GU negative Renal ROS     Musculoskeletal negative musculoskeletal ROS (+)   Abdominal (+) + obese,   Peds  Hematology negative hematology ROS (+)   Anesthesia Other Findings RIGHT CARPAL TUNNEL  RIGHT LUNATE CYST  Reproductive/Obstetrics                            Anesthesia Physical Anesthesia Plan  ASA: II  Anesthesia Plan: Regional   Post-op Pain Management:    Induction: Intravenous  PONV Risk Score and Plan: 1 and Ondansetron, Dexamethasone, Midazolam and Treatment may vary due to age or medical condition  Airway Management Planned: Simple Face Mask  Additional Equipment:   Intra-op Plan:   Post-operative Plan:   Informed Consent: I have reviewed the patients History and Physical, chart, labs and discussed the procedure including the risks, benefits and alternatives for the proposed anesthesia with the patient or authorized representative who has indicated his/her understanding and acceptance.     Dental advisory given  Plan Discussed with: CRNA  Anesthesia Plan Comments:        Anesthesia Quick Evaluation

## 2020-05-11 LAB — SURGICAL PATHOLOGY

## 2020-05-15 ENCOUNTER — Encounter (HOSPITAL_BASED_OUTPATIENT_CLINIC_OR_DEPARTMENT_OTHER): Payer: Self-pay | Admitting: Orthopedic Surgery

## 2020-05-25 ENCOUNTER — Other Ambulatory Visit: Payer: Self-pay | Admitting: Orthopedic Surgery

## 2020-07-12 NOTE — Progress Notes (Signed)
Chart reviewed with Dr. Clemens Catholic. Nebulizer treatment needed and decrease in oxygen saturation after last surgery. Was discharged home same day. Ok to proceed with surgery as scheduled.

## 2020-07-13 ENCOUNTER — Encounter (HOSPITAL_BASED_OUTPATIENT_CLINIC_OR_DEPARTMENT_OTHER): Payer: Self-pay | Admitting: Orthopedic Surgery

## 2020-07-13 ENCOUNTER — Other Ambulatory Visit: Payer: Self-pay

## 2020-07-16 ENCOUNTER — Other Ambulatory Visit (HOSPITAL_COMMUNITY)
Admission: RE | Admit: 2020-07-16 | Discharge: 2020-07-16 | Disposition: A | Discharge status: Home / Self Care | Payer: BC Managed Care – PPO | Source: Ambulatory Visit | Attending: Orthopedic Surgery | Admitting: Orthopedic Surgery

## 2020-07-16 DIAGNOSIS — Z01812 Encounter for preprocedural laboratory examination: Secondary | ICD-10-CM | POA: Insufficient documentation

## 2020-07-16 DIAGNOSIS — Z20822 Contact with and (suspected) exposure to covid-19: Secondary | ICD-10-CM | POA: Insufficient documentation

## 2020-07-16 DIAGNOSIS — M65352 Trigger finger, left little finger: Secondary | ICD-10-CM | POA: Diagnosis not present

## 2020-07-16 DIAGNOSIS — G5602 Carpal tunnel syndrome, left upper limb: Secondary | ICD-10-CM | POA: Diagnosis not present

## 2020-07-16 DIAGNOSIS — I10 Essential (primary) hypertension: Secondary | ICD-10-CM | POA: Diagnosis not present

## 2020-07-16 DIAGNOSIS — Z79899 Other long term (current) drug therapy: Secondary | ICD-10-CM | POA: Diagnosis not present

## 2020-07-16 LAB — SARS CORONAVIRUS 2 (TAT 6-24 HRS): SARS Coronavirus 2: NEGATIVE

## 2020-07-19 ENCOUNTER — Encounter (HOSPITAL_BASED_OUTPATIENT_CLINIC_OR_DEPARTMENT_OTHER): Payer: Self-pay | Admitting: Orthopedic Surgery

## 2020-07-19 ENCOUNTER — Encounter (HOSPITAL_BASED_OUTPATIENT_CLINIC_OR_DEPARTMENT_OTHER)
Admission: RE | Disposition: A | Discharge status: Home / Self Care | Payer: Self-pay | Source: Home / Self Care | Attending: Orthopedic Surgery

## 2020-07-19 ENCOUNTER — Ambulatory Visit (HOSPITAL_BASED_OUTPATIENT_CLINIC_OR_DEPARTMENT_OTHER): Payer: BC Managed Care – PPO | Admitting: Anesthesiology

## 2020-07-19 ENCOUNTER — Other Ambulatory Visit: Payer: Self-pay

## 2020-07-19 ENCOUNTER — Ambulatory Visit (HOSPITAL_BASED_OUTPATIENT_CLINIC_OR_DEPARTMENT_OTHER)
Admission: RE | Admit: 2020-07-19 | Discharge: 2020-07-19 | Disposition: A | Discharge status: Home / Self Care | Payer: BC Managed Care – PPO | Attending: Orthopedic Surgery | Admitting: Orthopedic Surgery

## 2020-07-19 DIAGNOSIS — Z20822 Contact with and (suspected) exposure to covid-19: Secondary | ICD-10-CM | POA: Insufficient documentation

## 2020-07-19 DIAGNOSIS — Z79899 Other long term (current) drug therapy: Secondary | ICD-10-CM | POA: Insufficient documentation

## 2020-07-19 DIAGNOSIS — G5602 Carpal tunnel syndrome, left upper limb: Secondary | ICD-10-CM | POA: Insufficient documentation

## 2020-07-19 DIAGNOSIS — I10 Essential (primary) hypertension: Secondary | ICD-10-CM | POA: Insufficient documentation

## 2020-07-19 DIAGNOSIS — M65352 Trigger finger, left little finger: Secondary | ICD-10-CM | POA: Insufficient documentation

## 2020-07-19 HISTORY — PX: CARPAL TUNNEL RELEASE: SHX101

## 2020-07-19 HISTORY — PX: TRIGGER FINGER RELEASE: SHX641

## 2020-07-19 SURGERY — CARPAL TUNNEL RELEASE
Anesthesia: Monitor Anesthesia Care | Site: Hand | Laterality: Left

## 2020-07-19 MED ORDER — LACTATED RINGERS IV SOLN: INTRAVENOUS | Status: DC

## 2020-07-19 MED ORDER — HYDROCODONE-ACETAMINOPHEN 5-325 MG PO TABS: ORAL_TABLET | ORAL | 0 refills | Status: DC

## 2020-07-19 MED ORDER — MIDAZOLAM HCL 2 MG/2ML IJ SOLN
2.0000 mg | Freq: Once | INTRAMUSCULAR | Status: AC
  Administered 2020-07-19: 2 mg via INTRAVENOUS

## 2020-07-19 MED ORDER — CEFAZOLIN SODIUM-DEXTROSE 2-4 GM/100ML-% IV SOLN
2.0000 g | INTRAVENOUS | Status: AC
  Administered 2020-07-19: 2 g via INTRAVENOUS

## 2020-07-19 MED ORDER — 0.9 % SODIUM CHLORIDE (POUR BTL) OPTIME
TOPICAL | Status: DC | PRN
  Administered 2020-07-19: 500 mL

## 2020-07-19 MED ORDER — PROPOFOL 10 MG/ML IV BOLUS
INTRAVENOUS | Status: AC
  Filled 2020-07-19: qty 20

## 2020-07-19 MED ORDER — FENTANYL CITRATE (PF) 100 MCG/2ML IJ SOLN
100.0000 ug | Freq: Once | INTRAMUSCULAR | Status: AC
  Administered 2020-07-19: 50 ug via INTRAVENOUS

## 2020-07-19 MED ORDER — MIDAZOLAM HCL 2 MG/2ML IJ SOLN
INTRAMUSCULAR | Status: AC
  Filled 2020-07-19: qty 2

## 2020-07-19 MED ORDER — PROPOFOL 10 MG/ML IV BOLUS
INTRAVENOUS | Status: DC | PRN
  Administered 2020-07-19: 40 mg via INTRAVENOUS

## 2020-07-19 MED ORDER — ACETAMINOPHEN 500 MG PO TABS
ORAL_TABLET | ORAL | Status: AC
  Filled 2020-07-19: qty 2

## 2020-07-19 MED ORDER — ROPIVACAINE HCL 5 MG/ML IJ SOLN
INTRAMUSCULAR | Status: DC | PRN
  Administered 2020-07-19: 30 mL via PERINEURAL

## 2020-07-19 MED ORDER — MIDAZOLAM HCL 5 MG/5ML IJ SOLN
INTRAMUSCULAR | Status: DC | PRN
  Administered 2020-07-19: 2 mg via INTRAVENOUS

## 2020-07-19 MED ORDER — DEXAMETHASONE SODIUM PHOSPHATE 10 MG/ML IJ SOLN
INTRAMUSCULAR | Status: DC | PRN
  Administered 2020-07-19: 5 mg

## 2020-07-19 MED ORDER — FENTANYL CITRATE (PF) 100 MCG/2ML IJ SOLN
INTRAMUSCULAR | Status: AC
  Filled 2020-07-19: qty 2

## 2020-07-19 MED ORDER — ONDANSETRON HCL 4 MG/2ML IJ SOLN
INTRAMUSCULAR | Status: AC
  Filled 2020-07-19: qty 2

## 2020-07-19 MED ORDER — ACETAMINOPHEN 500 MG PO TABS
1000.0000 mg | ORAL_TABLET | Freq: Once | ORAL | Status: AC
  Administered 2020-07-19: 1000 mg via ORAL

## 2020-07-19 MED ORDER — PROPOFOL 500 MG/50ML IV EMUL
INTRAVENOUS | Status: DC | PRN
  Administered 2020-07-19: 125 ug/kg/min via INTRAVENOUS

## 2020-07-19 MED ORDER — ONDANSETRON HCL 4 MG/2ML IJ SOLN
INTRAMUSCULAR | Status: DC | PRN
  Administered 2020-07-19: 4 mg via INTRAVENOUS

## 2020-07-19 MED ORDER — FENTANYL CITRATE (PF) 100 MCG/2ML IJ SOLN: 25.0000 ug | INTRAMUSCULAR | Status: DC | PRN

## 2020-07-19 MED ORDER — CEFAZOLIN SODIUM-DEXTROSE 2-4 GM/100ML-% IV SOLN
INTRAVENOUS | Status: AC
  Filled 2020-07-19: qty 100

## 2020-07-19 SURGICAL SUPPLY — 36 items
APL PRP STRL LF DISP 70% ISPRP (MISCELLANEOUS) ×1
BLADE SURG 15 STRL LF DISP TIS (BLADE) ×2 IMPLANT
BLADE SURG 15 STRL SS (BLADE) ×4
BNDG CMPR 9X4 STRL LF SNTH (GAUZE/BANDAGES/DRESSINGS)
BNDG COHESIVE 2X5 TAN STRL LF (GAUZE/BANDAGES/DRESSINGS) ×2 IMPLANT
BNDG ELASTIC 3X5.8 VLCR STR LF (GAUZE/BANDAGES/DRESSINGS) ×2 IMPLANT
BNDG ESMARK 4X9 LF (GAUZE/BANDAGES/DRESSINGS) IMPLANT
BNDG GAUZE ELAST 4 BULKY (GAUZE/BANDAGES/DRESSINGS) ×2 IMPLANT
CHLORAPREP W/TINT 26 (MISCELLANEOUS) ×2 IMPLANT
CORD BIPOLAR FORCEPS 12FT (ELECTRODE) ×2 IMPLANT
COVER BACK TABLE 60X90IN (DRAPES) ×2 IMPLANT
COVER MAYO STAND STRL (DRAPES) ×2 IMPLANT
COVER WAND RF STERILE (DRAPES) IMPLANT
CUFF TOURN SGL QUICK 18X4 (TOURNIQUET CUFF) ×2 IMPLANT
DRAPE EXTREMITY T 121X128X90 (DISPOSABLE) ×2 IMPLANT
DRAPE SURG 17X23 STRL (DRAPES) ×2 IMPLANT
DRSG PAD ABDOMINAL 8X10 ST (GAUZE/BANDAGES/DRESSINGS) ×2 IMPLANT
GAUZE SPONGE 4X4 12PLY STRL (GAUZE/BANDAGES/DRESSINGS) ×2 IMPLANT
GAUZE XEROFORM 1X8 LF (GAUZE/BANDAGES/DRESSINGS) ×2 IMPLANT
GLOVE BIO SURGEON STRL SZ7.5 (GLOVE) ×2 IMPLANT
GLOVE SRG 8 PF TXTR STRL LF DI (GLOVE) ×1 IMPLANT
GLOVE SURG UNDER POLY LF SZ8 (GLOVE) ×2
GOWN STRL REUS W/ TWL LRG LVL3 (GOWN DISPOSABLE) ×1 IMPLANT
GOWN STRL REUS W/TWL LRG LVL3 (GOWN DISPOSABLE) ×2
GOWN STRL REUS W/TWL XL LVL3 (GOWN DISPOSABLE) ×2 IMPLANT
NEEDLE HYPO 25X1 1.5 SAFETY (NEEDLE) ×2 IMPLANT
NS IRRIG 1000ML POUR BTL (IV SOLUTION) ×2 IMPLANT
PACK BASIN DAY SURGERY FS (CUSTOM PROCEDURE TRAY) ×2 IMPLANT
PADDING CAST ABS 4INX4YD NS (CAST SUPPLIES) ×1
PADDING CAST ABS COTTON 4X4 ST (CAST SUPPLIES) ×1 IMPLANT
STOCKINETTE 4X48 STRL (DRAPES) ×2 IMPLANT
SUT ETHILON 4 0 PS 2 18 (SUTURE) ×2 IMPLANT
SYR BULB EAR ULCER 3OZ GRN STR (SYRINGE) ×2 IMPLANT
SYR CONTROL 10ML LL (SYRINGE) ×2 IMPLANT
TOWEL GREEN STERILE FF (TOWEL DISPOSABLE) ×4 IMPLANT
UNDERPAD 30X36 HEAVY ABSORB (UNDERPADS AND DIAPERS) ×2 IMPLANT

## 2020-07-19 NOTE — H&P (Signed)
  Tammy Cowan is an 64 y.o. female.   Chief Complaint: left carpal tunnel syndrome, left small finger trigger digit HPI: 64 yo female with numbness and tingling left hand.  Positive nerve conduction studies.  She wishes to have left carpal tunnel release and left small finger trigger release.  Allergies:  Allergies  Allergen Reactions  . Crestor [Rosuvastatin Calcium] Diarrhea    Pt states diarrhea for months after taking crestor    Past Medical History:  Diagnosis Date  . Depression   . Hypertension     Past Surgical History:  Procedure Laterality Date  . CARPAL TUNNEL RELEASE Right 05/10/2020   Procedure: CARPAL TUNNEL RELEASE;  Surgeon: Betha Loa, MD;  Location: [facility];  Service: Orthopedics;  Laterality: Right;  block in preop  . CHOLECYSTECTOMY    . CYST EXCISION Right 05/10/2020   Procedure: RIGHT LUNATE CURRETAGE CYST AND BONE GRAFTING  ALLOGRAFT;  Surgeon: Betha Loa, MD;  Location: [facility];  Service: Orthopedics;  Laterality: Right;  block in preop  . TONSILLECTOMY      Family History: History reviewed. No pertinent family history.  Social History:   reports that she has been smoking cigarettes. She has been smoking about 1.50 packs per day. She has never used smokeless tobacco. She reports previous alcohol use. She reports that she does not use drugs.  Medications: Medications Prior to Admission  Medication Sig Dispense Refill  . Biotin 1 MG CAPS Take by mouth.    . calcium carbonate (OS-CAL - DOSED IN MG OF ELEMENTAL CALCIUM) 1250 (500 Ca) MG tablet Take 1 tablet by mouth.    . cholecalciferol (VITAMIN D3) 25 MCG (1000 UNIT) tablet Take 1,000 Units by mouth daily.    Marland Kitchen HYDROcodone-acetaminophen (NORCO) 5-325 MG tablet 1-2 tabs po q6 hours prn pain 30 tablet 0  . sertraline (ZOLOFT) 100 MG tablet Take 100 mg by mouth daily.    . valsartan (DIOVAN) 80 MG tablet Take 80 mg by mouth daily.    . vitamin B-12 (CYANOCOBALAMIN)  100 MCG tablet Take 100 mcg by mouth daily.      No results found for this or any previous visit (from the past 48 hour(s)).  No results found.   A comprehensive review of systems was negative.  Blood pressure 116/71, pulse 77, temperature 98.2 F (36.8 C), temperature source Oral, resp. rate 18, height 5\' 3"  (1.6 m), weight 87.4 kg, SpO2 96 %.  General appearance: alert, cooperative and appears stated age Head: Normocephalic, without obvious abnormality, atraumatic Neck: supple, symmetrical, trachea midline Cardio: regular rate and rhythm Resp: clear to auscultation bilaterally Extremities: Intact sensation and capillary refill all digits.  +epl/fpl/io.  No wounds.  Pulses: 2+ and symmetric Skin: Skin color, texture, turgor normal. No rashes or lesions Neurologic: Grossly normal Incision/Wound: none  Assessment/Plan Left carpal tunnel syndrome and left small finger trigger digit.  Non operative and operative treatment options have been discussed with the patient and patient wishes to proceed with operative treatment. Risks, benefits, and alternatives of surgery have been discussed and the patient agrees with the plan of care.   07/19/2020, 12:50 PM

## 2020-07-19 NOTE — Discharge Instructions (Addendum)
Regional Anesthesia Blocks ° °1. Numbness or the inability to move the "blocked" extremity may last from 3-48 hours after placement. The length of time depends on the medication injected and your individual response to the medication. If the numbness is not going away after 48 hours, call your surgeon. ° °2. The extremity that is blocked will need to be protected until the numbness is gone and the  Strength has returned. Because you cannot feel it, you will need to take extra care to avoid injury. Because it may be weak, you may have difficulty moving it or using it. You may not know what position it is in without looking at it while the block is in effect. ° °3. For blocks in the legs and feet, returning to weight bearing and walking needs to be done carefully. You will need to wait until the numbness is entirely gone and the strength has returned. You should be able to move your leg and foot normally before you try and bear weight or walk. You will need someone to be with you when you first try to ensure you do not fall and possibly risk injury. ° °4. Bruising and tenderness at the needle site are common side effects and will resolve in a few days. ° °5. Persistent numbness or new problems with movement should be communicated to the surgeon or the [facility] ([phone])/ [facility] ([phone]). ° ° ° °Post Anesthesia Home Care Instructions ° °Activity: °Get plenty of rest for the remainder of the day. A responsible individual must stay with you for 24 hours following the procedure.  °For the next 24 hours, DO NOT: °-Drive a car °-Operate machinery °-Drink alcoholic beverages °-Take any medication unless instructed by your physician °-Make any legal decisions or sign important papers. ° °Meals: °Start with liquid foods such as gelatin or soup. Progress to regular foods as tolerated. Avoid greasy, spicy, heavy foods. If nausea and/or vomiting occur, drink only clear liquids until the  nausea and/or vomiting subsides. Call your physician if vomiting continues. ° °Special Instructions/Symptoms: °Your throat may feel dry or sore from the anesthesia or the breathing tube placed in your throat during surgery. If this causes discomfort, gargle with warm salt water. The discomfort should disappear within 24 hours. ° °If you had a scopolamine patch placed behind your ear for the management of post- operative nausea and/or vomiting: ° °1. The medication in the patch is effective for 72 hours, after which it should be removed.  Wrap patch in a tissue and discard in the trash. Wash hands thoroughly with soap and water. °2. You may remove the patch earlier than 72 hours if you experience unpleasant side effects which may include dry mouth, dizziness or visual disturbances. °3. Avoid touching the patch. Wash your hands with soap and water after contact with the patch. °  °[facility] Center Instructions °Hand Surgery ° °Wound Care: °Keep your hand elevated above the level of your heart.  Do not allow it to dangle by your side.  Keep the dressing dry and do not remove it unless your doctor advises you to do so.  He will usually change it at the time of your post-op visit.  Moving your fingers is advised to stimulate circulation but will depend on the site of your surgery.  If you have a splint applied, your doctor will advise you regarding movement. ° °Activity: °Do not drive or operate machinery today.  Rest today and then you may   return to your normal activity and work as indicated by your physician. ° °Diet:  °Drink liquids today or eat a light diet.  You may resume a regular diet tomorrow.   ° °General expectations: °Pain for two to three days. °Fingers may become slightly swollen. ° °Call your doctor if any of the following occur: °Severe pain not relieved by pain medication. °Elevated temperature. °Dressing soaked with blood. °Inability to move fingers. °White or bluish color to fingers. ° °

## 2020-07-19 NOTE — Progress Notes (Signed)
Assisted Dr. Woodrum with left, ultrasound guided, supraclavicular block. Side rails up, monitors on throughout procedure. See vital signs in flow sheet. Tolerated Procedure well. 

## 2020-07-19 NOTE — Anesthesia Postprocedure Evaluation (Signed)
Anesthesia Post Note  Patient: Tammy Cowan  Procedure(s) Performed: CARPAL TUNNEL RELEASE (Left Hand) RELEASE TRIGGER FINGER/A-1 PULLEY LEFT SMALL FINGER (Left Hand)     Patient location during evaluation: PACU Anesthesia Type: MAC and Regional Level of consciousness: awake and alert Pain management: pain level controlled Vital Signs Assessment: post-procedure vital signs reviewed and stable Respiratory status: spontaneous breathing, nonlabored ventilation, respiratory function stable and patient connected to nasal cannula oxygen Cardiovascular status: stable and blood pressure returned to baseline Postop Assessment: no apparent nausea or vomiting Anesthetic complications: no   No complications documented.  Last Vitals:  Vitals:   07/19/20 1515 07/19/20 1520  BP:    Pulse: 81 71  Resp: 16 20  Temp:  36.4 C  SpO2: 94% 94%    Last Pain:  Vitals:   07/19/20 1520  TempSrc: Oral  PainSc: 0-No pain                 Allayna Erlich L Zakkiyya Barno

## 2020-07-19 NOTE — Op Note (Signed)
07/19/2020 [facility]                              OPERATIVE REPORT   PREOPERATIVE DIAGNOSIS:   Left carpal tunnel syndrome. Left small finger trigger digit  POSTOPERATIVE DIAGNOSIS:   Left carpal tunnel syndrome Left small finger trigger digit  PROCEDURE:   1. Left carpal tunnel release. 2. Left small finger trigger release  SURGEON:  Tammy Loa, MD  ASSISTANT:  none.  ANESTHESIA: Regional with sedation  IV FLUIDS:  Per anesthesia flow sheet.  ESTIMATED BLOOD LOSS:  Minimal.  COMPLICATIONS:  None.  SPECIMENS:  None.  TOURNIQUET TIME:    Total Tourniquet Time Documented: Upper Arm (Left) - 20 minutes Total: Upper Arm (Left) - 20 minutes   DISPOSITION:  Stable to PACU.  LOCATION: [facility]  INDICATIONS:  64 yo female with numbness and tingling left hand.  Positive nerve conduction studies.  Left small finger trigger digit.   She wishes to have a carpal tunnel release for management of her symptoms.  Risks, benefits and alternatives of surgery were discussed including the risk of blood loss; infection; damage to nerves, vessels, tendons, ligaments, bone; failure of surgery; need for additional surgery; complications with wound healing; continued pain; recurrence of carpal tunnel syndrome; and damage to motor branch. She voiced understanding of these risks and elected to proceed.   OPERATIVE COURSE:  After being identified preoperatively by myself, the patient and I agreed upon the procedure and site of procedure.  The surgical site was marked.  The risks, benefits, and alternatives of the surgery were reviewed and she wished to proceed.  Surgical consent had been signed.  She was given IV Ancef as preoperative antibiotic prophylaxis.  She was transferred to the operating room and placed on the operating room table in supine position with the Left upper extremity on an armboard.  Sedation was induced by the anesthesiologist. and A regional  block had been performed by anesthesia in preoperative holding.   Left upper extremity was prepped and draped in normal sterile orthopaedic fashion.  A surgical pause was performed between the surgeons, anesthesia, and operating room staff, and all were in agreement as to the patient, procedure, and site of procedure.  Tourniquet at the proximal aspect of the extremity was inflated to 250 mmHg after exsanguination of the arm with an Esmarch bandage  An incision was made at the volar aspect of the MP joint of the little finger.  This was carried into the subcutaneous tissues by preading technique.  Bipolar electrocautery was used to obtain hemostasis.  The radial and ulnar digital nerves were protected throughout the case. The flexor sheath was identified.  The A1 pulley was identified and sharply incised.  It was released in its entirety.  The proximal 1-2 mm of the A2 pulley was vented to allow better excursion of the tendons.  The finger was placed through a range of motion and there was noted to be no catching.  The tendons were brought through the wound and any adherences released.  The wound was then copiously irrigated with sterile saline. It was closed with 4-0 nylon in a horizontal mattress fashion.  Incision was made over the transverse carpal ligament and carried into the subcutaneous tissues by spreading technique.  Bipolar electrocautery was used to obtain hemostasis.  The palmar fascia was sharply incised.  The transverse carpal ligament was identified and sharply incised.  It was incised distally first.  The flexor tendons were identified.  The flexor tendon to the ring finger was identified and retracted radially.  The transverse carpal ligament was then incised proximally.  Scissors were used to split the distal aspect of the volar antebrachial fascia.  A finger was placed into the wound to ensure complete decompression, which was the case.  The nerve was examined.  It was flattened and hyperemic.   The motor branch was identified and was intact.  The wound was copiously irrigated with sterile saline.  It was then closed with 4-0 nylon in a horizontal mattress fashion.  It was injected with 0.25% plain Marcaine to aid in postoperative analgesia.  It was dressed with sterile Xeroform, 4x4s, an ABD, and wrapped with Kerlix and an Ace bandage.  Tourniquet was deflated at 20 minutes.  Fingertips were pink with brisk capillary refill after deflation of the tourniquet.  Operative drapes were broken down.  The patient was awoken from anesthesia safely.  She was transferred back to stretcher and taken to the PACU in stable condition.  I will see her back in the office in 1 week for postoperative followup.  I will give her a prescription for Norco 5/325 1-2 tabs PO q6 hours prn pain, dispense # 20.    Tammy Loa, MD Electronically signed, 07/19/20

## 2020-07-19 NOTE — Transfer of Care (Signed)
Immediate Anesthesia Transfer of Care Note  Patient: Tammy Cowan  Procedure(s) Performed: CARPAL TUNNEL RELEASE (Left Hand) RELEASE TRIGGER FINGER/A-1 PULLEY LEFT SMALL FINGER (Left Hand)  Patient Location: PACU  Anesthesia Type:mac  Level of Consciousness: awake, alert  and oriented  Airway & Oxygen Therapy: Patient Spontanous Breathing and Patient connected to face mask oxygen  Post-op Assessment: Report given to RN and Post -op Vital signs reviewed and stable  Post vital signs: Reviewed and stable  Last Vitals:  Vitals Value Taken Time  BP 121/84 07/19/20 1449  Temp    Pulse 77 07/19/20 1452  Resp 14 07/19/20 1452  SpO2 99 % 07/19/20 1452  Vitals shown include unvalidated device data.  Last Pain:  Vitals:   07/19/20 1126  TempSrc: Oral  PainSc: 0-No pain         Complications: No complications documented.

## 2020-07-19 NOTE — Anesthesia Preprocedure Evaluation (Addendum)
Anesthesia Evaluation  Patient identified by MRN, date of birth, ID band Patient awake    Reviewed: Allergy & Precautions, NPO status , Patient's Chart, lab work & pertinent test results  Airway Mallampati: II  TM Distance: >3 FB Neck ROM: Full    Dental no notable dental hx. (+) Teeth Intact, Dental Advisory Given   Pulmonary neg pulmonary ROS, Current Smoker and Patient abstained from smoking.,    Pulmonary exam normal breath sounds clear to auscultation       Cardiovascular hypertension, Pt. on medications negative cardio ROS Normal cardiovascular exam Rhythm:Regular Rate:Normal     Neuro/Psych PSYCHIATRIC DISORDERS Depression negative neurological ROS     GI/Hepatic negative GI ROS, Neg liver ROS,   Endo/Other  negative endocrine ROS  Renal/GU negative Renal ROS  negative genitourinary   Musculoskeletal negative musculoskeletal ROS (+)   Abdominal   Peds  Hematology negative hematology ROS (+)   Anesthesia Other Findings   Reproductive/Obstetrics                            Anesthesia Physical Anesthesia Plan  ASA: II  Anesthesia Plan: Regional and MAC   Post-op Pain Management:  Regional for Post-op pain   Induction: Intravenous  PONV Risk Score and Plan: 1 and Ondansetron, Midazolam and Propofol infusion  Airway Management Planned: Simple Face Mask and Natural Airway  Additional Equipment:   Intra-op Plan:   Post-operative Plan:   Informed Consent: I have reviewed the patients History and Physical, chart, labs and discussed the procedure including the risks, benefits and alternatives for the proposed anesthesia with the patient or authorized representative who has indicated his/her understanding and acceptance.     Dental advisory given  Plan Discussed with: CRNA  Anesthesia Plan Comments: (Pt would like to avoid airway manipulation and requested a peripheral nerve  block. )       Anesthesia Quick Evaluation

## 2020-07-19 NOTE — Anesthesia Procedure Notes (Signed)
Anesthesia Regional Block: Supraclavicular block   Pre-Anesthetic Checklist: ,, timeout performed, Correct Patient, Correct Site, Correct Laterality, Correct Procedure, Correct Position, site marked, Risks and benefits discussed,  Surgical consent,  Pre-op evaluation,  At surgeon's request and post-op pain management  Laterality: Left  Prep: Maximum Sterile Barrier Precautions used, chloraprep       Needles:  Injection technique: Single-shot  Needle Type: Echogenic Stimulator Needle     Needle Length: 9cm  Needle Gauge: 22     Additional Needles:   Procedures:,,,, ultrasound used (permanent image in chart),,,,  Narrative:  Start time: 07/19/2020 1:00 PM End time: 07/19/2020 1:07 PM Injection made incrementally with aspirations every 5 mL.  Performed by: Personally  Anesthesiologist: Elmer Picker, MD  Additional Notes: Monitors applied. No increased pain on injection. No increased resistance to injection. Injection made in 5cc increments. Good needle visualization. Patient tolerated procedure well.

## 2020-07-20 ENCOUNTER — Encounter (HOSPITAL_BASED_OUTPATIENT_CLINIC_OR_DEPARTMENT_OTHER): Payer: Self-pay | Admitting: Orthopedic Surgery

## 2022-11-19 ENCOUNTER — Encounter: Payer: Self-pay | Admitting: Physical Therapy

## 2022-11-19 ENCOUNTER — Ambulatory Visit: Payer: BC Managed Care – PPO | Attending: Obstetrics and Gynecology | Admitting: Physical Therapy

## 2022-11-19 ENCOUNTER — Other Ambulatory Visit: Payer: Self-pay

## 2022-11-19 DIAGNOSIS — R279 Unspecified lack of coordination: Secondary | ICD-10-CM | POA: Insufficient documentation

## 2022-11-19 DIAGNOSIS — M6281 Muscle weakness (generalized): Secondary | ICD-10-CM | POA: Insufficient documentation

## 2022-11-19 DIAGNOSIS — R293 Abnormal posture: Secondary | ICD-10-CM | POA: Diagnosis present

## 2022-11-19 NOTE — Patient Instructions (Addendum)

## 2022-11-19 NOTE — Therapy (Addendum)
OUTPATIENT PHYSICAL THERAPY FEMALE PELVIC EVALUATION   Patient Name: Tammy Cowan MRN: [id number] DOB:Jun 27, 1956, 67 y.o., female Today's Date: 11/19/2022  END OF SESSION:  PT End of Session - 11/19/22 0759     Visit Number 1    Date for PT Re-Evaluation 02/18/23    Authorization Type [employer]    PT Start Time 0800    PT Stop Time 0841    PT Time Calculation (min) 41 min    Activity Tolerance Patient tolerated treatment well    Behavior During Therapy [facility] for tasks assessed/performed             Past Medical History:  Diagnosis Date   Depression    Hypertension    Past Surgical History:  Procedure Laterality Date   CARPAL TUNNEL RELEASE Right 05/10/2020   Procedure: CARPAL TUNNEL RELEASE;  Surgeon: Betha Loa, MD;  Location: [facility];  Service: Orthopedics;  Laterality: Right;  block in preop   CARPAL TUNNEL RELEASE Left 07/19/2020   Procedure: CARPAL TUNNEL RELEASE;  Surgeon: Betha Loa, MD;  Location: [facility];  Service: Orthopedics;  Laterality: Left;   CHOLECYSTECTOMY     CYST EXCISION Right 05/10/2020   Procedure: RIGHT LUNATE CURRETAGE CYST AND BONE GRAFTING  ALLOGRAFT;  Surgeon: Betha Loa, MD;  Location: [facility];  Service: Orthopedics;  Laterality: Right;  block in preop   TONSILLECTOMY     TRIGGER FINGER RELEASE Left 07/19/2020   Procedure: RELEASE TRIGGER FINGER/A-1 PULLEY LEFT SMALL FINGER;  Surgeon: Betha Loa, MD;  Location: [facility];  Service: Orthopedics;  Laterality: Left;   There are no problems to display for this patient.   PCP: Elizabeth Palau  REFERRING PROVIDER: Sherre Scarlet, MD  REFERRING DIAG: N39.3 (ICD-10-CM) - Primary stress urinary incontinence  THERAPY DIAG:  Muscle weakness (generalized)  Abnormal posture  Unspecified lack of coordination  Rationale for Evaluation and Treatment: Rehabilitation  ONSET DATE: one year  SUBJECTIVE:                                                                                                                                                                                            SUBJECTIVE STATEMENT: Has been on medication for incontinence for a year and helped a bit at first but not really. Leakage and or full loss of urine with strong urge, sneezing/laughing/coughing.   Fluid intake: Yes: water - 2-3 bottles per day, 3-12oz zero sodas, 2-3 cups of coffee       PAIN:  Are you having pain? No   PRECAUTIONS: None  WEIGHT BEARING RESTRICTIONS: No  FALLS:  Has patient fallen in last 6 months? No  LIVING ENVIRONMENT: Lives with: lives with their family Lives in: House/apartment  OCCUPATION: Production designer, theatre/television/film of I living   PLOF: Independent  PATIENT GOALS: to have less leakage  PERTINENT HISTORY:  Depression, Hypertension,   Sexual abuse: No  BOWEL MOVEMENT: Pain with bowel movement: No Type of bowel movement:Type (Bristol Stool Scale) 4, Frequency daily , and Strain No Fully empty rectum: No Leakage: No Pads: No Fiber supplement: No  URINATION: Pain with urination: No Fully empty bladder: Yes:   Stream: Strong Urgency: Yes:   Frequency: every 30 mins -1 hour; usually 1-2x at night  Leakage: Urge to void, Walking to the bathroom, Coughing, Sneezing, and Laughing Pads: Yes: all the time, 2-3 changes   INTERCOURSE: Pain with intercourse:  not active Ability to have vaginal penetration:  No Climax: not painful  Marinoff Scale: 0/3  PREGNANCY: Vaginal deliveries 2 Tearing Yes: with first C-section deliveries 0 Currently pregnant No  PROLAPSE: None   OBJECTIVE:   DIAGNOSTIC FINDINGS:    COGNITION: Overall cognitive status: Within functional limits for tasks assessed     SENSATION: Light touch: Appears intact Proprioception: Appears intact  MUSCLE LENGTH: Bil hamstrings and adductors limited by 50%    POSTURE: rounded shoulders, forward head, and posterior pelvic  tilt   LUMBARAROM/PROM:  A/PROM A/PROM  eval  Flexion Limited by 50%  Extension Limited 25%  Right lateral flexion Limited by 25%  Left lateral flexion Limited by 25%  Right rotation Limited by 25%  Left rotation Limited by 25%   (Blank rows = not tested)  LOWER EXTREMITY ROM:  WFL  LOWER EXTREMITY MMT:    Bil hip abduction 3/4, all others 4/5, knees 4+/5  PALPATION:   General  no TTP, mild tightness over suprapubic region but not pain                External Perineal Exam pt deferred                              Internal Pelvic Floor pt deferred  *pt reports internal completed at recent referring providers appointment. Chart reviewed and strength recorded as 4/5, tone normal, cough stress test (+), no pain internally, stage 1 prolapse reported however pt denies all symptoms.   Patient confirms identification and approves PT to assess internal pelvic floor and treatment No  PELVIC MMT:   MMT eval  Vaginal   Internal Anal Sphincter   External Anal Sphincter   Puborectalis   Diastasis Recti   (Blank rows = not tested)        TONE: Pt deferred  PROLAPSE: Pt deferred  TODAY'S TREATMENT:                                                                                                                              DATE:   11/19/2022 EVAL Examination completed,  findings reviewed, pt educated on POC, HEP, and urge drill and bladder irritants. Pt motivated to participate in PT and agreeable to attempt recommendations.     PATIENT EDUCATION:  Education details: [id number] Person educated: Patient Education method: Explanation, Demonstration, Tactile cues, Verbal cues, and Handouts Education comprehension: verbalized understanding and returned demonstration  HOME EXERCISE PROGRAM: [id number]  ASSESSMENT:  CLINICAL IMPRESSION: Patient is a 67 y.o. female  who was seen today for physical therapy evaluation and treatment for urinary incontinence. Pt reports leakage with  stressors and urgency to go to bathroom. Pt found to have decreased flexibility in spine and hips, decreased strength in core and bil hips, mild tightness/restrictions at suprapubic region but no TTP throughout exam. Pt deferred internal vaginal assessment as referring provider at [facility] did an internal assessment and her findings documented in her chart. Per chart review these were recorded in this eval based on MD's findings. Pt agreeable to additional internal assessment with PT if needed at later appointment however would like to attempt recommendations based on MD's findings as these were completed last week. Pt would benefit from additional PT to further address deficits.    OBJECTIVE IMPAIRMENTS: decreased coordination, decreased endurance, decreased mobility, increased fascial restrictions, impaired flexibility, improper body mechanics, and postural dysfunction.   ACTIVITY LIMITATIONS: continence  PARTICIPATION LIMITATIONS: shopping, community activity, and occupation  PERSONAL FACTORS: Fitness, Time since onset of injury/illness/exacerbation, and 1 comorbidity: x2 vaginal delivery   are also affecting patient's functional outcome.   REHAB POTENTIAL: Good  CLINICAL DECISION MAKING: Stable/uncomplicated  EVALUATION COMPLEXITY: Low   GOALS: Goals reviewed with patient? Yes  SHORT TERM GOALS: Target date: 12/17/22  Pt to be I with HEP.  Baseline: Goal status: INITIAL  2.  Pt will have 25% less urgency due to bladder retraining and strengthening  Baseline:  Goal status: INITIAL  3.  Pt to report improved time between bladder voids to at least 1 hours for improved QOL with decreased urinary frequency.   Baseline:  Goal status: INITIAL  4.  Pt to be I with breathing mechanics and pelvic floor coordination for improved symptoms of urinary leakage.  Baseline:  Goal status: INITIAL   LONG TERM GOALS: Target date: 02/18/23  Pt to be I with advanced HEP.  Baseline:  Goal status:  INITIAL  2.  Pt to demonstrate at least 5/5 bil hip strength for improved pelvic stability and functional squats without leakage.  Baseline:  Goal status: INITIAL  3.  Pt will have 50% less urgency due to bladder retraining and strengthening  Baseline:  Goal status: INITIAL  4.  Pt to report improved time between bladder voids to at least 2 hours for improved QOL with decreased urinary frequency.   Baseline:  Goal status: INITIAL  5.  Pt to demonstrate improved coordination of pelvic floor and breathing mechanics with squat with appropriate synergistic patterns to decrease pain and leakage at least 75% of the time.    Baseline:  Goal status: INITIAL  6.  Pt to demonstrate at least 4/5 pelvic floor strength for at least 8s for improved pelvic stability and decreased strain at pelvic floor/ decrease leakage.  Baseline:  Goal status: INITIAL  PLAN:  PT FREQUENCY: every other week  PT DURATION:  6 sessions  PLANNED INTERVENTIONS: Therapeutic exercises, Therapeutic activity, Neuromuscular re-education, Balance training, Gait training, Patient/Family education, Self Care, Spinal mobilization, Cryotherapy, Moist heat, scar mobilization, Biofeedback, and Manual therapy  PLAN FOR NEXT SESSION: pelvic floor strengthening, coordination of pelvic floor  and breathing and core, coordination of strengthening exercises and pelvic floor, breathing mechanics, voiding mechanics, review urge drill  Otelia Sergeant, PT, DPT 11/18/2408:25 AM   PHYSICAL THERAPY DISCHARGE SUMMARY  Visits from Start of Care: 1  Current functional level related to goals / functional outcomes: Unable to formally reassess   Remaining deficits: Unable to formally reassess   Education / Equipment: HEP   Patient agrees to discharge. Patient goals were not met. Patient is being discharged due to financial reasons.

## 2022-12-10 ENCOUNTER — Other Ambulatory Visit: Payer: Self-pay

## 2022-12-10 ENCOUNTER — Ambulatory Visit: Payer: BC Managed Care – PPO | Admitting: Physical Therapy

## 2022-12-10 ENCOUNTER — Inpatient Hospital Stay (HOSPITAL_BASED_OUTPATIENT_CLINIC_OR_DEPARTMENT_OTHER)
Admission: EM | Admit: 2022-12-10 | Discharge: 2022-12-14 | DRG: 871 | Disposition: A | Discharge status: Home / Self Care | Payer: BC Managed Care – PPO | Source: Ambulatory Visit | Attending: Internal Medicine | Admitting: Internal Medicine

## 2022-12-10 ENCOUNTER — Encounter (HOSPITAL_BASED_OUTPATIENT_CLINIC_OR_DEPARTMENT_OTHER): Payer: Self-pay

## 2022-12-10 ENCOUNTER — Emergency Department (HOSPITAL_BASED_OUTPATIENT_CLINIC_OR_DEPARTMENT_OTHER): Payer: BC Managed Care – PPO | Admitting: Radiology

## 2022-12-10 DIAGNOSIS — E878 Other disorders of electrolyte and fluid balance, not elsewhere classified: Secondary | ICD-10-CM | POA: Diagnosis present

## 2022-12-10 DIAGNOSIS — A419 Sepsis, unspecified organism: Secondary | ICD-10-CM | POA: Diagnosis not present

## 2022-12-10 DIAGNOSIS — J189 Pneumonia, unspecified organism: Secondary | ICD-10-CM | POA: Diagnosis not present

## 2022-12-10 DIAGNOSIS — I10 Essential (primary) hypertension: Secondary | ICD-10-CM | POA: Diagnosis present

## 2022-12-10 DIAGNOSIS — Z1152 Encounter for screening for COVID-19: Secondary | ICD-10-CM

## 2022-12-10 DIAGNOSIS — Z8249 Family history of ischemic heart disease and other diseases of the circulatory system: Secondary | ICD-10-CM

## 2022-12-10 DIAGNOSIS — E785 Hyperlipidemia, unspecified: Secondary | ICD-10-CM | POA: Diagnosis not present

## 2022-12-10 DIAGNOSIS — Z716 Tobacco abuse counseling: Secondary | ICD-10-CM

## 2022-12-10 DIAGNOSIS — Z7951 Long term (current) use of inhaled steroids: Secondary | ICD-10-CM | POA: Diagnosis not present

## 2022-12-10 DIAGNOSIS — E869 Volume depletion, unspecified: Secondary | ICD-10-CM | POA: Diagnosis present

## 2022-12-10 DIAGNOSIS — Z888 Allergy status to other drugs, medicaments and biological substances status: Secondary | ICD-10-CM | POA: Diagnosis not present

## 2022-12-10 DIAGNOSIS — Z720 Tobacco use: Secondary | ICD-10-CM | POA: Insufficient documentation

## 2022-12-10 DIAGNOSIS — R6520 Severe sepsis without septic shock: Secondary | ICD-10-CM | POA: Diagnosis present

## 2022-12-10 DIAGNOSIS — J9601 Acute respiratory failure with hypoxia: Secondary | ICD-10-CM | POA: Diagnosis present

## 2022-12-10 DIAGNOSIS — R7989 Other specified abnormal findings of blood chemistry: Secondary | ICD-10-CM

## 2022-12-10 DIAGNOSIS — J439 Emphysema, unspecified: Secondary | ICD-10-CM | POA: Diagnosis present

## 2022-12-10 DIAGNOSIS — R0902 Hypoxemia: Secondary | ICD-10-CM

## 2022-12-10 DIAGNOSIS — J449 Chronic obstructive pulmonary disease, unspecified: Secondary | ICD-10-CM

## 2022-12-10 DIAGNOSIS — F32A Depression, unspecified: Secondary | ICD-10-CM | POA: Diagnosis present

## 2022-12-10 DIAGNOSIS — F17210 Nicotine dependence, cigarettes, uncomplicated: Secondary | ICD-10-CM | POA: Diagnosis present

## 2022-12-10 DIAGNOSIS — E871 Hypo-osmolality and hyponatremia: Secondary | ICD-10-CM

## 2022-12-10 DIAGNOSIS — J441 Chronic obstructive pulmonary disease with (acute) exacerbation: Secondary | ICD-10-CM

## 2022-12-10 DIAGNOSIS — J440 Chronic obstructive pulmonary disease with acute lower respiratory infection: Secondary | ICD-10-CM | POA: Diagnosis present

## 2022-12-10 DIAGNOSIS — Z79899 Other long term (current) drug therapy: Secondary | ICD-10-CM

## 2022-12-10 DIAGNOSIS — J188 Other pneumonia, unspecified organism: Secondary | ICD-10-CM | POA: Diagnosis present

## 2022-12-10 DIAGNOSIS — Z9049 Acquired absence of other specified parts of digestive tract: Secondary | ICD-10-CM | POA: Diagnosis not present

## 2022-12-10 LAB — CBC
HCT: 48 % — ABNORMAL HIGH (ref 36.0–46.0)
Hemoglobin: 16.4 g/dL — ABNORMAL HIGH (ref 12.0–15.0)
MCH: 30.1 pg (ref 26.0–34.0)
MCHC: 34.2 g/dL (ref 30.0–36.0)
MCV: 88.2 fL (ref 80.0–100.0)
Platelets: 272 10*3/uL (ref 150–400)
RBC: 5.44 MIL/uL — ABNORMAL HIGH (ref 3.87–5.11)
RDW: 13 % (ref 11.5–15.5)
WBC: 11.9 10*3/uL — ABNORMAL HIGH (ref 4.0–10.5)
nRBC: 0 % (ref 0.0–0.2)

## 2022-12-10 LAB — RESP PANEL BY RT-PCR (RSV, FLU A&B, COVID)  RVPGX2
Influenza A by PCR: NEGATIVE
Influenza B by PCR: NEGATIVE
Resp Syncytial Virus by PCR: NEGATIVE
SARS Coronavirus 2 by RT PCR: NEGATIVE

## 2022-12-10 LAB — BASIC METABOLIC PANEL
Anion gap: 10 (ref 5–15)
BUN: 14 mg/dL (ref 8–23)
CO2: 25 mmol/L (ref 22–32)
Calcium: 9.7 mg/dL (ref 8.9–10.3)
Chloride: 94 mmol/L — ABNORMAL LOW (ref 98–111)
Creatinine, Ser: 0.8 mg/dL (ref 0.44–1.00)
GFR, Estimated: >60 mL/min (ref >60)
Glucose, Bld: 114 mg/dL — ABNORMAL HIGH (ref 70–99)
Potassium: 4.1 mmol/L (ref 3.5–5.1)
Sodium: 129 mmol/L — ABNORMAL LOW (ref 135–145)

## 2022-12-10 LAB — DIFFERENTIAL
Abs Immature Granulocytes: 0.1 10*3/uL — ABNORMAL HIGH (ref 0.00–0.07)
Basophils Absolute: 0.1 10*3/uL (ref 0.0–0.1)
Basophils Relative: 0 %
Eosinophils Absolute: 0.1 10*3/uL (ref 0.0–0.5)
Eosinophils Relative: 1 %
Immature Granulocytes: 1 %
Lymphocytes Relative: 11 %
Lymphs Abs: 1.3 10*3/uL (ref 0.7–4.0)
Monocytes Absolute: 0.4 10*3/uL (ref 0.1–1.0)
Monocytes Relative: 3 %
Neutro Abs: 9.6 10*3/uL — ABNORMAL HIGH (ref 1.7–7.7)
Neutrophils Relative %: 84 %

## 2022-12-10 LAB — BRAIN NATRIURETIC PEPTIDE: B Natriuretic Peptide: 42.6 pg/mL (ref 0.0–100.0)

## 2022-12-10 MED ORDER — ENOXAPARIN SODIUM 40 MG/0.4ML IJ SOSY
40.0000 mg | PREFILLED_SYRINGE | INTRAMUSCULAR | Status: DC
  Administered 2022-12-11 – 2022-12-13 (×3): 40 mg via SUBCUTANEOUS
  Filled 2022-12-10 (×3): qty 0.4

## 2022-12-10 MED ORDER — GUAIFENESIN ER 600 MG PO TB12
600.0000 mg | ORAL_TABLET | Freq: Two times a day (BID) | ORAL | Status: DC
  Administered 2022-12-10 – 2022-12-14 (×8): 600 mg via ORAL
  Filled 2022-12-10 (×8): qty 1

## 2022-12-10 MED ORDER — ACETAMINOPHEN 325 MG PO TABS
650.0000 mg | ORAL_TABLET | Freq: Four times a day (QID) | ORAL | Status: DC | PRN
  Administered 2022-12-10 – 2022-12-12 (×2): 650 mg via ORAL
  Filled 2022-12-10 (×2): qty 2

## 2022-12-10 MED ORDER — LACTATED RINGERS IV SOLN
150.0000 mL/h | INTRAVENOUS | Status: AC
  Administered 2022-12-11 (×3): 150 mL/h via INTRAVENOUS

## 2022-12-10 MED ORDER — NICOTINE 14 MG/24HR TD PT24
14.0000 mg | MEDICATED_PATCH | Freq: Once | TRANSDERMAL | Status: AC
  Administered 2022-12-10: 14 mg via TRANSDERMAL
  Filled 2022-12-10: qty 1

## 2022-12-10 MED ORDER — IPRATROPIUM-ALBUTEROL 0.5-2.5 (3) MG/3ML IN SOLN
3.0000 mL | Freq: Four times a day (QID) | RESPIRATORY_TRACT | Status: DC
  Administered 2022-12-11 – 2022-12-13 (×10): 3 mL via RESPIRATORY_TRACT
  Filled 2022-12-10 (×10): qty 3

## 2022-12-10 MED ORDER — ALBUTEROL SULFATE HFA 108 (90 BASE) MCG/ACT IN AERS: 2.0000 | INHALATION_SPRAY | RESPIRATORY_TRACT | Status: DC | PRN

## 2022-12-10 MED ORDER — HYDROCHLOROTHIAZIDE 12.5 MG PO TABS: 12.5000 mg | ORAL_TABLET | Freq: Every day | ORAL | Status: DC

## 2022-12-10 MED ORDER — SODIUM CHLORIDE 0.9 % IV SOLN
500.0000 mg | Freq: Once | INTRAVENOUS | Status: AC
  Administered 2022-12-10: 500 mg via INTRAVENOUS
  Filled 2022-12-10: qty 5

## 2022-12-10 MED ORDER — SERTRALINE HCL 100 MG PO TABS
100.0000 mg | ORAL_TABLET | Freq: Every day | ORAL | Status: DC
  Administered 2022-12-11: 100 mg via ORAL
  Filled 2022-12-10: qty 1

## 2022-12-10 MED ORDER — CETIRIZINE HCL 10 MG PO TABS
10.0000 mg | ORAL_TABLET | Freq: Every evening | ORAL | Status: DC
  Administered 2022-12-11 – 2022-12-13 (×3): 10 mg via ORAL
  Filled 2022-12-10 (×3): qty 1

## 2022-12-10 MED ORDER — IPRATROPIUM-ALBUTEROL 0.5-2.5 (3) MG/3ML IN SOLN
3.0000 mL | RESPIRATORY_TRACT | Status: DC | PRN
  Administered 2022-12-12: 3 mL via RESPIRATORY_TRACT
  Filled 2022-12-10: qty 3

## 2022-12-10 MED ORDER — IRBESARTAN 150 MG PO TABS
150.0000 mg | ORAL_TABLET | Freq: Every day | ORAL | Status: DC
  Administered 2022-12-11 – 2022-12-14 (×4): 150 mg via ORAL
  Filled 2022-12-10 (×4): qty 1

## 2022-12-10 MED ORDER — TRAZODONE HCL 50 MG PO TABS: 25.0000 mg | ORAL_TABLET | Freq: Every evening | ORAL | Status: DC | PRN

## 2022-12-10 MED ORDER — VITAMIN D 25 MCG (1000 UNIT) PO TABS
1000.0000 [IU] | ORAL_TABLET | Freq: Every day | ORAL | Status: DC
  Administered 2022-12-11 – 2022-12-14 (×4): 1000 [IU] via ORAL
  Filled 2022-12-10 (×4): qty 1

## 2022-12-10 MED ORDER — IPRATROPIUM-ALBUTEROL 0.5-2.5 (3) MG/3ML IN SOLN: 3.0000 mL | Freq: Four times a day (QID) | RESPIRATORY_TRACT | Status: DC | PRN

## 2022-12-10 MED ORDER — VALSARTAN-HYDROCHLOROTHIAZIDE 160-12.5 MG PO TABS: 1.0000 | ORAL_TABLET | Freq: Every day | ORAL | Status: DC

## 2022-12-10 MED ORDER — BUSPIRONE HCL 5 MG PO TABS
10.0000 mg | ORAL_TABLET | Freq: Three times a day (TID) | ORAL | Status: DC
  Administered 2022-12-11: 10 mg via ORAL
  Filled 2022-12-10: qty 2

## 2022-12-10 MED ORDER — TRAZODONE HCL 50 MG PO TABS
50.0000 mg | ORAL_TABLET | Freq: Every day | ORAL | Status: DC
  Administered 2022-12-10 – 2022-12-13 (×4): 50 mg via ORAL
  Filled 2022-12-10 (×4): qty 1

## 2022-12-10 MED ORDER — LORAZEPAM 1 MG PO TABS
0.5000 mg | ORAL_TABLET | Freq: Once | ORAL | Status: AC
  Administered 2022-12-10: 0.5 mg via ORAL
  Filled 2022-12-10: qty 1

## 2022-12-10 MED ORDER — ONDANSETRON HCL 4 MG PO TABS: 4.0000 mg | ORAL_TABLET | Freq: Four times a day (QID) | ORAL | Status: DC | PRN

## 2022-12-10 MED ORDER — VITAMIN B-12 100 MCG PO TABS
100.0000 ug | ORAL_TABLET | Freq: Every day | ORAL | Status: DC
  Administered 2022-12-11 – 2022-12-13 (×3): 100 ug via ORAL
  Filled 2022-12-10 (×4): qty 1

## 2022-12-10 MED ORDER — MIRABEGRON ER 25 MG PO TB24
25.0000 mg | ORAL_TABLET | Freq: Every day | ORAL | Status: DC
  Administered 2022-12-11 – 2022-12-14 (×4): 25 mg via ORAL
  Filled 2022-12-10 (×4): qty 1

## 2022-12-10 MED ORDER — ALBUTEROL SULFATE (2.5 MG/3ML) 0.083% IN NEBU
10.0000 mg | INHALATION_SOLUTION | Freq: Once | RESPIRATORY_TRACT | Status: AC
  Administered 2022-12-10: 10 mg via RESPIRATORY_TRACT

## 2022-12-10 MED ORDER — HYDROCOD POLI-CHLORPHE POLI ER 10-8 MG/5ML PO SUER
5.0000 mL | Freq: Two times a day (BID) | ORAL | Status: DC | PRN
  Administered 2022-12-11 – 2022-12-12 (×4): 5 mL via ORAL
  Filled 2022-12-10 (×4): qty 5

## 2022-12-10 MED ORDER — HYDROCODONE-ACETAMINOPHEN 5-325 MG PO TABS
1.0000 | ORAL_TABLET | Freq: Four times a day (QID) | ORAL | Status: DC | PRN
  Administered 2022-12-11 – 2022-12-13 (×4): 2 via ORAL
  Filled 2022-12-10 (×4): qty 2

## 2022-12-10 MED ORDER — MAGNESIUM HYDROXIDE 400 MG/5ML PO SUSP: 30.0000 mL | Freq: Every day | ORAL | Status: DC | PRN

## 2022-12-10 MED ORDER — AZELASTINE HCL 0.1 % NA SOLN
1.0000 | Freq: Two times a day (BID) | NASAL | Status: DC
  Administered 2022-12-11 – 2022-12-13 (×6): 1 via NASAL
  Filled 2022-12-10: qty 30

## 2022-12-10 MED ORDER — ALBUTEROL SULFATE (2.5 MG/3ML) 0.083% IN NEBU
3.0000 mL | INHALATION_SOLUTION | RESPIRATORY_TRACT | Status: DC | PRN
  Filled 2022-12-10: qty 3

## 2022-12-10 MED ORDER — ATORVASTATIN CALCIUM 10 MG PO TABS
10.0000 mg | ORAL_TABLET | Freq: Every day | ORAL | Status: DC
  Administered 2022-12-11 – 2022-12-14 (×4): 10 mg via ORAL
  Filled 2022-12-10 (×4): qty 1

## 2022-12-10 MED ORDER — CALCIUM CARBONATE 1250 (500 CA) MG PO TABS
1.0000 | ORAL_TABLET | Freq: Every day | ORAL | Status: DC
  Administered 2022-12-11 – 2022-12-14 (×4): 1250 mg via ORAL
  Filled 2022-12-10 (×4): qty 1

## 2022-12-10 MED ORDER — SODIUM CHLORIDE 0.9 % IV SOLN
2.0000 g | INTRAVENOUS | Status: DC
  Administered 2022-12-11 – 2022-12-13 (×3): 2 g via INTRAVENOUS
  Filled 2022-12-10 (×3): qty 20

## 2022-12-10 MED ORDER — SODIUM CHLORIDE 0.9 % IV SOLN
1.0000 g | Freq: Once | INTRAVENOUS | Status: AC
  Administered 2022-12-10: 1 g via INTRAVENOUS
  Filled 2022-12-10: qty 10

## 2022-12-10 MED ORDER — ONDANSETRON HCL 4 MG/2ML IJ SOLN: 4.0000 mg | Freq: Four times a day (QID) | INTRAMUSCULAR | Status: DC | PRN

## 2022-12-10 MED ORDER — ALBUTEROL SULFATE (2.5 MG/3ML) 0.083% IN NEBU
INHALATION_SOLUTION | RESPIRATORY_TRACT | Status: AC
  Administered 2022-12-11: 2.5 mg
  Filled 2022-12-10: qty 12

## 2022-12-10 MED ORDER — SODIUM CHLORIDE 0.9 % IV SOLN
500.0000 mg | INTRAVENOUS | Status: DC
  Administered 2022-12-11 – 2022-12-13 (×3): 500 mg via INTRAVENOUS
  Filled 2022-12-10 (×3): qty 5

## 2022-12-10 MED ORDER — ACETAMINOPHEN 650 MG RE SUPP: 650.0000 mg | Freq: Four times a day (QID) | RECTAL | Status: DC | PRN

## 2022-12-10 NOTE — Progress Notes (Signed)
RT ambulated Pt on RA. Pt walked out of her room and  her SATS dropped to 86%. RT walked the Pt back to her room and placed her on 2L [place]. RT will continue to monitor

## 2022-12-10 NOTE — Progress Notes (Signed)
Pt got transferred by [facility] from [facility] ED to [phone].  At arrival Pt is alert and oriented x 4, on 3 LPM of O2 NCL, SPO2 92-95%, NSR on the monitor, BP 150/76 mmHg, RR 21, Temp 98.9 F. Lung sound has no wheezing on auscultations, but positive for crackles on lower lobe. Pt is able to ambulate independently in room. She presents some mild shortness of breath with exertion. Her daughter who is a Engineer, civil (consulting) and her niece are at bedside. Pt has mild headache. Dr. Julian Reil notified. We will monitor.   Filiberto Pinks, RN

## 2022-12-10 NOTE — ED Notes (Signed)
Report attempted, RN to return call.

## 2022-12-10 NOTE — Plan of Care (Signed)
[facility] to [facility] 67 yo F, smoker, sick with URI symptoms and SOB for ~1 week.  CXR = PNA vs Pulm edema.  WBC 11k, no other SIRS.  Desatting to upper 80s on RA with ambulation.  BNP and CMP not back yet because all [facility] labs are apparently send outs.  EDP thinks PNA, starting on CAP treatment.  Put in for tele bed.  TRH will assume care on arrival to accepting facility. Until arrival, care as per EDP. However, TR[facility] available 24/7 for questions and assistance.  Nursing staff, please page [facility] Admits and Consults [phone]) as soon as the patient arrives to the hospital.

## 2022-12-10 NOTE — ED Triage Notes (Signed)
Pt c/o SHOB, cough, congestion x2wks, seen at PCP today for same, got 2 breathing treatments "w just a little help," steroid injection, sent to r/o pneumonia. Denies fevers, endorses "hot flashes." 1ppd smoker, hx asthma, "beginnings of emphysema."

## 2022-12-10 NOTE — ED Provider Notes (Signed)
[facility] AT [facility] Provider Note   CSN: [id number] Arrival date & time: 12/10/22  1702     History  Chief Complaint  Patient presents with   Shortness of Breath    Tammy Cowan is a 67 y.o. female with medical history depression hypertension.  The patient presents to the ED for evaluation of shortness of breath.  Patient states that for the last 1 week she has had shortness of breath, worse in the last 2 days.  Patient reports he is a 1 pack/day smoker and has done so for "longer than I have been alive".  Patient goes on to state that she was at her PCPs office earlier today where her PCP noted that she would probably had beginnings of emphysema and she was hypoxic at her PCP office.  She received steroids, 2 breathing treatments that did not alleviate her shortness of breath and she was sent here for further management.  On the monitor in the room, the patient is an oxygen saturation of 91% while lying in bed.  The respiratory therapist states that after patient ambulated her oxygen saturation dropped down to 86%.  The patient has audible wheezing.  She denies any fevers, sore throat, chest pain, nausea, vomiting, diarrhea, leg swelling.  She does endorse that she gets very dizzy and lightheaded when she walks.   Shortness of Breath      Home Medications Prior to Admission medications   Medication Sig Start Date End Date Taking? Authorizing Provider  Biotin 1 MG CAPS Take by mouth.    [provider]  calcium carbonate (OS-CAL - DOSED IN MG OF ELEMENTAL CALCIUM) 1250 (500 Ca) MG tablet Take 1 tablet by mouth.    [provider]  cholecalciferol (VITAMIN D3) 25 MCG (1000 UNIT) tablet Take 1,000 Units by mouth daily.    [provider]  HYDROcodone-acetaminophen (NORCO) 5-325 MG tablet 1-2 tabs po q6 hours prn pain 07/19/20   Betha Loa, MD  sertraline (ZOLOFT) 100 MG tablet Take 100 mg by mouth daily.    [provider]  valsartan (DIOVAN) 80 MG tablet Take 80 mg by mouth daily.    [provider]  vitamin B-12 (CYANOCOBALAMIN) 100 MCG tablet Take 100 mcg by mouth daily.    [provider]      Allergies    Crestor [rosuvastatin calcium]    Review of Systems   Review of Systems  Respiratory:  Positive for shortness of breath.   All other systems reviewed and are negative.   Physical Exam Updated Vital Signs BP 138/71 (BP Location: Right Arm)   Pulse 85   Resp (!) 23   SpO2 91%  Physical Exam Vitals and nursing note reviewed.  Constitutional:      General: She is not in acute distress.    Appearance: Normal appearance. She is not ill-appearing, toxic-appearing or diaphoretic.  HENT:     Head: Normocephalic and atraumatic.     Nose: Nose normal.     Mouth/Throat:     Mouth: Mucous membranes are moist.     Pharynx: Oropharynx is clear.  Eyes:     Extraocular Movements: Extraocular movements intact.     Conjunctiva/sclera: Conjunctivae normal.     Pupils: Pupils are equal, round, and reactive to light.  Cardiovascular:     Rate and Rhythm: Normal rate and regular rhythm.  Pulmonary:     Effort: Pulmonary effort is normal.     Breath sounds:  Wheezing present.  Abdominal:     General: Abdomen is flat. Bowel sounds are normal.     Palpations: Abdomen is soft.     Tenderness: There is no abdominal tenderness.  Musculoskeletal:     Cervical back: Normal range of motion and neck supple. No tenderness.  Skin:    General: Skin is warm and dry.     Capillary Refill: Capillary refill takes less than 2 seconds.  Neurological:     Mental Status: She is alert and oriented to person, place, and time.     ED Results / Procedures / Treatments   Labs (all labs ordered are listed, but only abnormal results are displayed) Labs Reviewed  CBC - Abnormal; Notable for the following components:      Result Value   WBC 11.9 (*)    RBC 5.44 (*)    Hemoglobin 16.4 (*)     HCT 48.0 (*)    All other components within normal limits  RESP PANEL BY RT-PCR (RSV, FLU A&B, COVID)  [id number]  BASIC METABOLIC PANEL  BRAIN NATRIURETIC PEPTIDE  DIFFERENTIAL    EKG EKG Interpretation  Date/Time:  Wednesday Dec 10 2022 17:14:28 EDT Ventricular Rate:  84 PR Interval:  156 QRS Duration: 80 QT Interval:  394 QTC Calculation: 465 R Axis:   76 Text Interpretation: Normal sinus rhythm Cannot rule out Anterior infarct , age undetermined Abnormal ECG When compared with ECG of 07-May-2020 07:37, No significant change since last tracing Confirmed by Alvira Monday ([id number]) on 12/10/2022 7:43:18 PM  Radiology DG Chest Port 1 View  Result Date: 12/10/2022 CLINICAL DATA:  141880 SOB (shortness of breath) 141880 EXAM: PORTABLE CHEST 1 VIEW COMPARISON:  Radiograph 08/03/2018 FINDINGS: Unchanged cardiomediastinal silhouette. There are diffuse interstitial opacities and a confluent density in the left peripheral lung base. No large effusion. No evidence of pneumothorax. Mild thoracic spondylosis. No acute osseous abnormality. IMPRESSION: Mild diffuse interstitial opacities suggesting interstitial edema. Focal confluent opacity in the left peripheral lung base, could reflect atelectasis or infection. Electronically Signed   By: Caprice Renshaw M.D.   On: 12/10/2022 18:42    Procedures .Critical Care  Performed by: Al Decant, PA-C Authorized by: Al Decant, PA-C   Critical care provider statement:    Critical care time (minutes):  75   Critical care time was exclusive of:  Separately billable procedures and treating other patients   Critical care was necessary to treat or prevent imminent or life-threatening deterioration of the following conditions:  Respiratory failure   Critical care was time spent personally by me on the following activities:  Blood draw for specimens, development of treatment plan with patient or surrogate, discussions with consultants,  discussions with primary provider, evaluation of patient's response to treatment, examination of patient, interpretation of cardiac output measurements, obtaining history from patient or surrogate, ordering and performing treatments and interventions, ordering and review of laboratory studies, ordering and review of radiographic studies, pulse oximetry, re-evaluation of patient's condition, review of old charts and vascular access procedures   I assumed direction of critical care for this patient from another provider in my specialty: no     Care discussed with: admitting provider      Medications Ordered in ED Medications  nicotine (NICODERM CQ - dosed in mg/24 hours) patch 14 mg (14 mg Transdermal Patch Applied 12/10/22 1800)  albuterol (PROVENTIL) (2.5 MG/3ML) 0.083% nebulizer solution (  Not Given 12/10/22 1747)  cefTRIAXone (ROCEPHIN) 1 g in sodium chloride 0.9 %  100 mL IVPB (has no administration in time range)  azithromycin (ZITHROMAX) 500 mg in sodium chloride 0.9 % 250 mL IVPB (has no administration in time range)  albuterol (PROVENTIL) (2.5 MG/3ML) 0.083% nebulizer solution 10 mg (10 mg Nebulization Given 12/10/22 1737)  LORazepam (ATIVAN) tablet 0.5 mg (0.5 mg Oral Given 12/10/22 1800)    ED Course/ Medical Decision Making/ A&P  Medical Decision Making Amount and/or Complexity of Data Reviewed Labs: ordered. Radiology: ordered.  Risk OTC drugs. Prescription drug management. Decision regarding hospitalization.   67 year old female presents to the ED for evaluation.  Please see HPI for further details.  On examination the patient is afebrile and nontachycardic.  Her lung sounds have wheezing throughout, she is hypoxic on the monitor, she desaturates to 86% while ambulating.  Her abdomen is soft and compressible.  Her neurological examination shows no focal neurodeficits.  While awaiting labs the patient received 0.5 mg of Ativan as she states that she was very anxious.  She also  received a continuous duo nebulizer at 10 mg 1 hour.  After these were done, the patient remained hypoxic.  Patient CBC with leukocytosis 11.9, hemoglobin 16.4.  Chest x-ray shows focal confluent opacity in the left peripheral lung base could reflect atelectasis or infection.  We will start the patient on CAP treatment at this time to include Rocephin and azithromycin.  Her BMP is pending, her viral panel is pending, her BMP is pending.  Her EKG is nonischemic.  The patient was discussed with attending Dr. Julian Reil who has agreed to admit the patient.  The patient is stable at this time.   Final Clinical Impression(s) / ED Diagnoses Final diagnoses:  Community acquired pneumonia of left lung, unspecified part of lung  Hypoxia    Rx / DC Orders ED Discharge Orders     None         Clent Ridges 12/10/22 1948    Alvira Monday, MD 12/11/22 [phone]

## 2022-12-10 NOTE — ED Notes (Signed)
Report called to Kenn[facility], RN 2W @[facility] 

## 2022-12-10 NOTE — H&P (Incomplete)
Tammy Cowan   PATIENT NAME: Tammy Cowan    MR#:  [id number]  DATE OF BIRTH:  03-Dec-1955  DATE OF ADMISSION:  12/10/2022  PRIMARY CARE PHYSICIAN: Elizabeth Palau, FNP   Patient is coming from: Home  REQUESTING/REFERRING PHYSICIAN: Al Decant, PA-C ([facility] ED)    CHIEF COMPLAINT:   Chief Complaint  Patient presents with   Shortness of Breath    HISTORY OF PRESENT ILLNESS:  Tammy Cowan is a 67 y.o. female with medical history significant for depression and hypertension, presented to the emergency room with acute onset of worsening dyspnea over the last week that got significantly deteriorating since yesterday with associated cough productive of clear sputum as well as wheezing.  She continues to smoke 1 passages per day.  She admitted to nausea without vomiting on Monday and denies any diarrhea.  No fever or chills.  No dysuria, oliguria or hematuria or flank pain.  No chest pain or palpitations.  She denies any orthopnea or paroxysmal nocturnal dyspnea or worsening lower extremity edema.  No chest pain or palpitations.  ED Course: When she came to the ER, respiratory rate was 23 with otherwise normal vital signs.  Later on heart rate was up to 94.  Pulse currently was later on 88% on room air and get up to 95% on 3 L of O2 via nasal cannula.  BNP came back normal at 42.6.  Labs revealed hyponatremia of 129 and hypochloremia of 94, leukocytosis of 11.9 with neutrophilia as well as hemoconcentration.  Influenza antigens and COVID-19 PCR came back negative. EKG as reviewed by me : EKG showed normal sinus rhythm with a rate of 84 with poor R wave progression Imaging: Full chest x-ray showed mild diffuse interstitial opacities that could be related to interstitial edema and focal confluent opacity in the left peripheral lung that could reflect atelectasis or infection.  The patient was given IV Rocephin and Zithromax as well as 0.5 mg of p.o. Ativan.  She will be admitted to  a medical telemetry bed for further evaluation and management. PAST MEDICAL HISTORY:   Past Medical History:  Diagnosis Date   Depression    Hypertension     PAST SURGICAL HISTORY:   Past Surgical History:  Procedure Laterality Date   CARPAL TUNNEL RELEASE Right 05/10/2020   Procedure: CARPAL TUNNEL RELEASE;  Surgeon: Betha Loa, MD;  Location: [facility];  Service: Orthopedics;  Laterality: Right;  block in preop   CARPAL TUNNEL RELEASE Left 07/19/2020   Procedure: CARPAL TUNNEL RELEASE;  Surgeon: Betha Loa, MD;  Location: [facility];  Service: Orthopedics;  Laterality: Left;   CHOLECYSTECTOMY     CYST EXCISION Right 05/10/2020   Procedure: RIGHT LUNATE CURRETAGE CYST AND BONE GRAFTING  ALLOGRAFT;  Surgeon: Betha Loa, MD;  Location: [facility];  Service: Orthopedics;  Laterality: Right;  block in preop   TONSILLECTOMY     TRIGGER FINGER RELEASE Left 07/19/2020   Procedure: RELEASE TRIGGER FINGER/A-1 PULLEY LEFT SMALL FINGER;  Surgeon: Betha Loa, MD;  Location:  SURGERY CENTER;  Service: Orthopedics;  Laterality: Left;    SOCIAL HISTORY:   Social History   Tobacco Use   Smoking status: Every Day    Packs/day: 1.5    Types: Cigarettes   Smokeless tobacco: Never  Substance Use Topics   Alcohol use: Not Currently    FAMILY HISTORY:   Positive for MI in her mother  DRUG ALLERGIES:  Allergies  Allergen Reactions   Crestor [Rosuvastatin Calcium] Diarrhea    Pt states diarrhea for months after taking crestor   Vibegron     REVIEW OF SYSTEMS:   ROS As per history of present illness. All pertinent systems were reviewed above. Constitutional, HEENT, cardiovascular, respiratory, GI, GU, musculoskeletal, neuro, psychiatric, endocrine, integumentary and hematologic systems were reviewed and are otherwise negative/unremarkable except for positive findings mentioned above in the HPI.   MEDICATIONS AT HOME:    Prior to Admission medications   Medication Sig Start Date End Date Taking? Authorizing Provider  albuterol (VENTOLIN HFA) 108 (90 Base) MCG/ACT inhaler Inhale 2 puffs into the lungs every 4 (four) hours as needed for wheezing or shortness of breath.   Yes [provider]  azelastine (ASTELIN) 0.1 % nasal spray Place 1 spray into both nostrils 2 (two) times daily. Use in each nostril as directed   Yes [provider]  busPIRone (BUSPAR) 10 MG tablet Take 10 mg by mouth 3 (three) times daily.   Yes [provider]  calcium carbonate (OS-CAL - DOSED IN MG OF ELEMENTAL CALCIUM) 1250 (500 Ca) MG tablet Take 1 tablet by mouth.   Yes [provider]  cholecalciferol (VITAMIN D3) 25 MCG (1000 UNIT) tablet Take 1,000 Units by mouth daily.   Yes [provider]  ipratropium-albuterol (DUONEB) 0.5-2.5 (3) MG/3ML SOLN Take 3 mLs by nebulization every 6 (six) hours as needed.   Yes [provider]  levocetirizine (XYZAL) 5 MG tablet Take 5 mg by mouth every evening.   Yes [provider]  sertraline (ZOLOFT) 100 MG tablet Take 100 mg by mouth daily.   Yes [provider]  traZODone (DESYREL) 50 MG tablet Take 50 mg by mouth at bedtime.   Yes [provider]  valsartan (DIOVAN) 80 MG tablet Take 80 mg by mouth daily.   Yes [provider]  valsartan-hydrochlorothiazide (DIOVAN-HCT) 160-12.5 MG tablet Take 1 tablet by mouth daily.   Yes [provider]  Vibegron 75 MG TABS Take 75 mg by mouth daily.   Yes [provider]  vitamin B-12 (CYANOCOBALAMIN) 100 MCG tablet Take 100 mcg by mouth daily.   Yes [provider]  atorvastatin (LIPITOR) 10 MG tablet Take 10 mg by mouth daily.    [provider]  Biotin 1 MG CAPS Take by mouth.    [provider]  fluticasone furoate-vilanterol (BREO ELLIPTA) 200-25 MCG/ACT AEPB Inhale 1 puff into the lungs daily.    [provider]   HYDROcodone-acetaminophen (NORCO) 5-325 MG tablet 1-2 tabs po q6 hours prn pain 07/19/20   Betha Loa, MD      VITAL SIGNS:  Blood pressure 129/68, pulse 87, temperature 98.9 F (37.2 C), temperature source Oral, resp. rate 18, height 5\' 2"  (1.575 m), weight 92.8 kg, SpO2 92 %.  PHYSICAL EXAMINATION:  Physical Exam  GENERAL:  67 y.o.-year-old patient lying in the bed with mild respiratory distress with conversational dyspnea. EYES: Pupils equal, round, reactive to light and accommodation. No scleral icterus. Extraocular muscles intact.  HEENT: Head atraumatic, normocephalic. Oropharynx and nasopharynx clear.  NECK:  Supple, no jugular venous distention. No thyroid enlargement, no tenderness.  LUNGS: Diminished bibasal breath sounds with mild bibasal crackles.  No use of accessory muscles of respiration.  CARDIOVASCULAR: Regular rate and rhythm, S1, S2 normal. No murmurs, rubs, or gallops.  ABDOMEN: Soft, nondistended, nontender. Bowel sounds present. No organomegaly or mass.  EXTREMITIES: No pedal edema, cyanosis, or  clubbing.  NEUROLOGIC: Cranial nerves II through XII are intact. Muscle strength 5/5 in all extremities. Sensation intact. Gait not checked.  PSYCHIATRIC: The patient is alert and oriented x 3.  Normal affect and good eye contact. SKIN: No obvious rash, lesion, or ulcer.   LABORATORY PANEL:   CBC Recent Labs  Lab 12/10/22 1801  WBC 11.9*  HGB 16.4*  HCT 48.0*  PLT 272   ------------------------------------------------------------------------------------------------------------------  Chemistries  Recent Labs  Lab 12/10/22 1801  NA 129*  K 4.1  CL 94*  CO2 25  GLUCOSE 114*  BUN 14  CREATININE 0.80  CALCIUM 9.7   ------------------------------------------------------------------------------------------------------------------  Cardiac Enzymes No results for input(s): "TROPONINI" in the last 168  hours. ------------------------------------------------------------------------------------------------------------------  RADIOLOGY:  DG Chest Port 1 View  Result Date: 12/10/2022 CLINICAL DATA:  141880 SOB (shortness of breath) [id number] EXAM: PORTABLE CHEST 1 VIEW COMPARISON:  Radiograph 08/03/2018 FINDINGS: Unchanged cardiomediastinal silhouette. There are diffuse interstitial opacities and a confluent density in the left peripheral lung base. No large effusion. No evidence of pneumothorax. Mild thoracic spondylosis. No acute osseous abnormality. IMPRESSION: Mild diffuse interstitial opacities suggesting interstitial edema. Focal confluent opacity in the left peripheral lung base, could reflect atelectasis or infection. Electronically Signed   By: Caprice Renshaw M.D.   On: 12/10/2022 18:42      IMPRESSION AND PLAN:  Assessment and Plan: * Sepsis due to pneumonia [facility]) - The patient will be admitted to a medical telemetry bed. - Sepsis manifested by  tachycardia and tachypnea. - Will continue antibiotic therapy with IV Rocephin and Zithromax. - Mucolytic therapy be provided as well as duo nebs q.i.d. and q.4 hours p.r.n. - We will follow blood cultures. - We will continue hydration with IV normal saline especially given hyponatremia.   Hyponatremia - This likely secondary to volume depletion. - We will continue hydration with IV normal saline and follow BMP.  CAP (community acquired pneumonia) - This is likely the culprit for #1. - The patient does not seem to be in acute CHF clinically.  Hypoxia - Secondary to pneumonia. - O2 protocol will be followed. - Management otherwise as above. -This could be qualifying her for severe sepsis.  Chronic obstructive pulmonary disease (COPD) (HCC) - She will be placed on as needed and scheduled DuoNebs. - We will hold off long-acting beta agonist at this time.  Essential hypertension - We will continue her Diovan and hold off  HCTZ.  Depression - We will continue Zoloft and trazodone.  Dyslipidemia Will continue statin therapy.     DVT prophylaxis: Lovenox.  Advanced Care Planning:  Code Status: full code.  Family Communication:  The plan of care was discussed in details with the patient (and family). I answered all questions. The patient agreed to proceed with the above mentioned plan. Further management will depend upon hospital course. Disposition Plan: Back to previous home environment Consults called: none.  All the records are reviewed and case discussed with ED provider.  Status is: Inpatient    At the time of the admission, it appears that the appropriate admission status for this patient is inpatient.  This is judged to be reasonable and necessary in order to provide the required intensity of service to ensure the patient's safety given the presenting symptoms, physical exam findings and initial radiographic and laboratory data in the context of comorbid conditions.  The patient requires inpatient status due to high intensity of service, high risk of further deterioration and high frequency of surveillance required.  I certify that at the time of admission, it is my clinical judgment that the patient will require inpatient hospital care extending more than 2 midnights.                            Dispo: The patient is from: Home              Anticipated d/c is to: Home              Patient currently is not medically stable to d/c.              Difficult to place patient: No  Hannah Beat M.D on 12/11/2022 at 1:34 AM  [facility]   From 7 PM-7 AM, contact night-coverage [email or web address]  CC: Primary care physician; Elizabeth Palau, FNP

## 2022-12-10 NOTE — ED Notes (Signed)
Room assignment changed, Report called to Filiberto Pinks, RN @ [facility]

## 2022-12-11 DIAGNOSIS — R7989 Other specified abnormal findings of blood chemistry: Secondary | ICD-10-CM | POA: Diagnosis not present

## 2022-12-11 DIAGNOSIS — I10 Essential (primary) hypertension: Secondary | ICD-10-CM | POA: Insufficient documentation

## 2022-12-11 DIAGNOSIS — J9601 Acute respiratory failure with hypoxia: Secondary | ICD-10-CM

## 2022-12-11 DIAGNOSIS — J189 Pneumonia, unspecified organism: Secondary | ICD-10-CM | POA: Diagnosis not present

## 2022-12-11 DIAGNOSIS — E871 Hypo-osmolality and hyponatremia: Secondary | ICD-10-CM

## 2022-12-11 DIAGNOSIS — Z720 Tobacco use: Secondary | ICD-10-CM | POA: Insufficient documentation

## 2022-12-11 DIAGNOSIS — J449 Chronic obstructive pulmonary disease, unspecified: Secondary | ICD-10-CM

## 2022-12-11 DIAGNOSIS — F32A Depression, unspecified: Secondary | ICD-10-CM | POA: Insufficient documentation

## 2022-12-11 DIAGNOSIS — J441 Chronic obstructive pulmonary disease with (acute) exacerbation: Secondary | ICD-10-CM

## 2022-12-11 DIAGNOSIS — E785 Hyperlipidemia, unspecified: Secondary | ICD-10-CM | POA: Insufficient documentation

## 2022-12-11 LAB — PROCALCITONIN: Procalcitonin: <0.1 ng/mL

## 2022-12-11 LAB — RESPIRATORY PANEL BY PCR

## 2022-12-11 LAB — CBC
HCT: 46.6 % — ABNORMAL HIGH (ref 36.0–46.0)
Hemoglobin: 16.4 g/dL — ABNORMAL HIGH (ref 12.0–15.0)
MCH: 30.3 pg (ref 26.0–34.0)
MCHC: 35.2 g/dL (ref 30.0–36.0)
MCV: 86 fL (ref 80.0–100.0)
Platelets: 278 10*3/uL (ref 150–400)
RBC: 5.42 MIL/uL — ABNORMAL HIGH (ref 3.87–5.11)
RDW: 12.7 % (ref 11.5–15.5)
WBC: 11.5 10*3/uL — ABNORMAL HIGH (ref 4.0–10.5)
nRBC: 0 % (ref 0.0–0.2)

## 2022-12-11 LAB — BASIC METABOLIC PANEL
Anion gap: 9 (ref 5–15)
BUN: 11 mg/dL (ref 8–23)
CO2: 22 mmol/L (ref 22–32)
Calcium: 9.2 mg/dL (ref 8.9–10.3)
Chloride: 97 mmol/L — ABNORMAL LOW (ref 98–111)
Creatinine, Ser: 0.66 mg/dL (ref 0.44–1.00)
GFR, Estimated: >60 mL/min (ref >60)
Glucose, Bld: 163 mg/dL — ABNORMAL HIGH (ref 70–99)
Potassium: 4.1 mmol/L (ref 3.5–5.1)
Sodium: 128 mmol/L — ABNORMAL LOW (ref 135–145)

## 2022-12-11 LAB — PROTIME-INR
INR: 1 (ref 0.8–1.2)
Prothrombin Time: 13.8 seconds (ref 11.4–15.2)

## 2022-12-11 LAB — HEPATIC FUNCTION PANEL
ALT: 12 U/L (ref 0–44)
AST: 18 U/L (ref 15–41)
Albumin: 3.5 g/dL (ref 3.5–5.0)
Alkaline Phosphatase: 79 U/L (ref 38–126)
Bilirubin, Direct: <0.1 mg/dL (ref 0.0–0.2)
Total Bilirubin: 0.4 mg/dL (ref 0.3–1.2)
Total Protein: 6.2 g/dL — ABNORMAL LOW (ref 6.5–8.1)

## 2022-12-11 LAB — HIV ANTIBODY (ROUTINE TESTING W REFLEX): HIV Screen 4th Generation wRfx: NONREACTIVE

## 2022-12-11 LAB — CORTISOL-AM, BLOOD: Cortisol - AM: 1.4 ug/dL — ABNORMAL LOW (ref 6.7–22.6)

## 2022-12-11 MED ORDER — BUSPIRONE HCL 5 MG PO TABS
10.0000 mg | ORAL_TABLET | Freq: Three times a day (TID) | ORAL | Status: DC
  Administered 2022-12-11 – 2022-12-14 (×10): 10 mg via ORAL
  Filled 2022-12-11 (×10): qty 2

## 2022-12-11 MED ORDER — SERTRALINE HCL 100 MG PO TABS
100.0000 mg | ORAL_TABLET | Freq: Every day | ORAL | Status: DC
  Administered 2022-12-11 – 2022-12-13 (×3): 100 mg via ORAL
  Filled 2022-12-11 (×3): qty 1

## 2022-12-11 MED ORDER — ORAL CARE MOUTH RINSE: 15.0000 mL | OROMUCOSAL | Status: DC | PRN

## 2022-12-11 MED ORDER — NICOTINE 14 MG/24HR TD PT24
14.0000 mg | MEDICATED_PATCH | Freq: Every day | TRANSDERMAL | Status: DC
  Administered 2022-12-11 – 2022-12-13 (×3): 14 mg via TRANSDERMAL
  Filled 2022-12-11 (×3): qty 1

## 2022-12-11 MED ORDER — COSYNTROPIN 0.25 MG IJ SOLR
0.2500 mg | Freq: Once | INTRAMUSCULAR | Status: AC
  Administered 2022-12-12: 0.25 mg via INTRAVENOUS
  Filled 2022-12-11: qty 0.25

## 2022-12-11 NOTE — Assessment & Plan Note (Addendum)
-   Differential includes volume depletion/prerenal versus adrenal insufficiency - s/p fluids - sodium has been improving  - follow up ACTH; repeated and pending at discharge

## 2022-12-11 NOTE — Assessment & Plan Note (Addendum)
Resume home regimen. 

## 2022-12-11 NOTE — Progress Notes (Signed)
RT note. Sputum cup left in patients room, patient instructed on how to obtain sputum sample. Instructed to let RN know once obtained to be sent down to lab. RT will continue to monitor.

## 2022-12-11 NOTE — Assessment & Plan Note (Addendum)
-  cessation counseling performed - continue nicotine patch

## 2022-12-11 NOTE — Hospital Course (Addendum)
Tammy Cowan is a 67 yo female with PMH ongoing tobacco use, depression, HTN who presented with worsening shortness of breath and cough.  Symptoms had progressively worsened over 2 to 3 days prior to admission. CXR obtained showed mild diffuse interstitial opacities and left peripheral lung base opacity concerning for possible infection. She had mild tachypnea and leukocytosis on admission.  She was started on antibiotics and admitted for further observation.  Also found to be hypoxic and placed on 3 L oxygen. She was able to be weaned off of oxygen prior to discharge and ambulated with adequate oxygen saturations.  She was continued on antibiotics and steroids to complete course at discharge.

## 2022-12-11 NOTE — Assessment & Plan Note (Addendum)
-   suspected due to CAP - continue abx and O2; wean as able -Oxygen saturations improved slowly each day.  She was able to ambulate on room air without dropping below 88%

## 2022-12-11 NOTE — Assessment & Plan Note (Addendum)
-   concerning associated findings include hyponatremia, lethargy, severely low cortisol level - however, appears she received solu-medrol 125 mg IM on 5/8 - ACTH likely suppressed from exogenous steroid use prior to admission; other considered differential included central adrenal insufficiency  - ACTH stim test technically normal (could be false workup from solumedrol vs true central AI but not burned out adrenals yet) - check TSH and FT4 (normal) - repeat ACTH on 5/11, should be rebounding

## 2022-12-11 NOTE — Assessment & Plan Note (Deleted)
-   This is likely the culprit for #1. - The patient does not seem to be in acute CHF clinically.

## 2022-12-11 NOTE — Progress Notes (Signed)
Progress Note    Tammy Cowan   WUJ:[id number]  DOB: March 16, 1956  DOA: 12/10/2022     1 PCP: Tammy Palau, FNP  Initial CC: SOB, cough  Hospital Course: Tammy Cowan is a 66 yo female with PMH ongoing tobacco use, depression, HTN who presented with worsening shortness of breath and cough.  Symptoms had progressively worsened over 2 to 3 days prior to admission. CXR obtained showed mild diffuse interstitial opacities and left peripheral lung base opacity concerning for possible infection. She had mild tachypnea and leukocytosis on admission.  She was started on antibiotics and admitted for further observation.  Also found to be hypoxic and placed on 3 L oxygen.  Interval History:  Resting bed when seen.  Still short of breath at times and remains on oxygen.  Endorses she has been still smoking prior to admission.  We discussed cessation. Further workup to be initiated including ACTH stim test tomorrow morning.  Assessment and Plan: * Sepsis due to pneumonia (HCC) - Tachypnea, leukocytosis, suspected lung source - Continue Rocephin and azithromycin - Follow-up cultures  Hyponatremia - Differential includes volume depletion/prerenal versus adrenal insufficiency - Continue fluids - Follow-up ACTH stim test - BMP in am  Low serum cortisol level - concerning associated findings include hyponatremia, lethargy, severely low cortisol level - will go ahead and workup  - check ACTH stim test in am - check ACTH level now  Acute respiratory failure with hypoxia (HCC) - suspected due to CAP - continue abx and O2; wean as able - will need walk test prior to d/c -Hold off on steroids for now until ACTH stim test complete  Chronic obstructive pulmonary disease (COPD) (HCC) - Continue nebulizers  Tobacco use -cessation counseling performed - continue nicotine patch  Essential hypertension - We will continue her Diovan and hold off HCTZ.  Depression - continue Zoloft and  trazodone.  Dyslipidemia - Continue Lipitor  Old records reviewed in assessment of this patient  Antimicrobials: Azithromycin 12/10/2022 >> current Rocephin 12/10/2022 >> current  DVT prophylaxis:  enoxaparin (LOVENOX) injection 40 mg Start: 12/11/22 1000   Code Status:   Code Status: Full Code  Mobility Assessment (last 72 hours)     Mobility Assessment     Row Name 12/11/22 0930 12/10/22 2211         Does patient have an order for bedrest or is patient medically unstable No - Continue assessment No - Continue assessment      What is the highest level of mobility based on the progressive mobility assessment? Level 6 (Walks independently in room and hall) - Balance while walking in room without assist - Complete Level 5 (Walks with assist in room/hall) - Balance while stepping forward/back and can walk in room with assist - Complete               Barriers to discharge: none Disposition Plan:  Home Status is: Inpt  Objective: Blood pressure 123/76, pulse 91, temperature 98.4 F (36.9 C), temperature source Oral, resp. rate 20, height 5\' 2"  (1.575 m), weight 92.8 kg, SpO2 90 %.  Examination:  Physical Exam Constitutional:      General: She is not in acute distress.    Appearance: Normal appearance.  HENT:     Head: Normocephalic and atraumatic.     Mouth/Throat:     Mouth: Mucous membranes are moist.  Eyes:     Extraocular Movements: Extraocular movements intact.  Cardiovascular:     Rate and Rhythm: Normal rate  and regular rhythm.  Pulmonary:     Effort: Pulmonary effort is normal. No respiratory distress.     Breath sounds: Wheezing and rhonchi present.  Abdominal:     General: Bowel sounds are normal. There is no distension.     Palpations: Abdomen is soft.     Tenderness: There is no abdominal tenderness.  Musculoskeletal:        General: Normal range of motion.     Cervical back: Normal range of motion and neck supple.  Skin:    General: Skin is warm  and dry.  Neurological:     General: No focal deficit present.     Mental Status: She is alert.  Psychiatric:        Mood and Affect: Mood normal.        Behavior: Behavior normal.      Consultants:    Procedures:    Data Reviewed: Results for orders placed or performed during the hospital encounter of 12/10/22 (from the past 24 hour(s))  CBC     Status: Abnormal   Collection Time: 12/10/22  6:01 PM  Result Value Ref Range   WBC 11.9 (H) 4.0 - 10.5 K/uL   RBC 5.44 (H) 3.87 - 5.11 MIL/uL   Hemoglobin 16.4 (H) 12.0 - 15.0 g/dL   HCT 16.1 (H) 09.6 - 04.5 %   MCV 88.2 80.0 - 100.0 fL   MCH 30.1 26.0 - 34.0 pg   MCHC 34.2 30.0 - 36.0 g/dL   RDW 40.9 81.1 - 91.4 %   Platelets 272 150 - 400 K/uL   nRBC 0.0 0.0 - 0.2 %  Basic metabolic panel     Status: Abnormal   Collection Time: 12/10/22  6:01 PM  Result Value Ref Range   Sodium 129 (L) 135 - 145 mmol/L   Potassium 4.1 3.5 - 5.1 mmol/L   Chloride 94 (L) 98 - 111 mmol/L   CO2 25 22 - 32 mmol/L   Glucose, Bld 114 (H) 70 - 99 mg/dL   BUN 14 8 - 23 mg/dL   Creatinine, Ser 7.82 0.44 - 1.00 mg/dL   Calcium 9.7 8.9 - 95.6 mg/dL   GFR, Estimated >21 >30 mL/min   Anion gap 10 5 - 15  Brain natriuretic peptide     Status: None   Collection Time: 12/10/22  6:01 PM  Result Value Ref Range   B Natriuretic Peptide 42.6 0.0 - 100.0 pg/mL  Differential     Status: Abnormal   Collection Time: 12/10/22  6:01 PM  Result Value Ref Range   Neutrophils Relative % 84 %   Neutro Abs 9.6 (H) 1.7 - 7.7 K/uL   Lymphocytes Relative 11 %   Lymphs Abs 1.3 0.7 - 4.0 K/uL   Monocytes Relative 3 %   Monocytes Absolute 0.4 0.1 - 1.0 K/uL   Eosinophils Relative 1 %   Eosinophils Absolute 0.1 0.0 - 0.5 K/uL   Basophils Relative 0 %   Basophils Absolute 0.1 0.0 - 0.1 K/uL   Immature Granulocytes 1 %   Abs Immature Granulocytes 0.10 (H) 0.00 - 0.07 K/uL  Resp panel by RT-PCR (RSV, Flu A&B, Covid) Anterior Nasal Swab     Status: None   Collection  Time: 12/10/22  7:55 PM   Specimen: Anterior Nasal Swab  Result Value Ref Range   SARS Coronavirus 2 by RT PCR NEGATIVE NEGATIVE   Influenza A by PCR NEGATIVE NEGATIVE   Influenza B by PCR NEGATIVE NEGATIVE  Resp Syncytial Virus by PCR NEGATIVE NEGATIVE  HIV Antibody (routine testing w rflx)     Status: None   Collection Time: 12/11/22  2:04 AM  Result Value Ref Range   HIV Screen 4th Generation wRfx Non Reactive Non Reactive  Protime-INR     Status: None   Collection Time: 12/11/22  2:04 AM  Result Value Ref Range   Prothrombin Time 13.8 11.4 - 15.2 seconds   INR 1.0 0.8 - 1.2  Cortisol-am, blood     Status: Abnormal   Collection Time: 12/11/22  2:04 AM  Result Value Ref Range   Cortisol - AM 1.4 (L) 6.7 - 22.6 ug/dL  Procalcitonin     Status: None   Collection Time: 12/11/22  2:04 AM  Result Value Ref Range   Procalcitonin <0.10 ng/mL  Basic metabolic panel     Status: Abnormal   Collection Time: 12/11/22  2:04 AM  Result Value Ref Range   Sodium 128 (L) 135 - 145 mmol/L   Potassium 4.1 3.5 - 5.1 mmol/L   Chloride 97 (L) 98 - 111 mmol/L   CO2 22 22 - 32 mmol/L   Glucose, Bld 163 (H) 70 - 99 mg/dL   BUN 11 8 - 23 mg/dL   Creatinine, Ser 1.19 0.44 - 1.00 mg/dL   Calcium 9.2 8.9 - 14.7 mg/dL   GFR, Estimated >82 >95 mL/min   Anion gap 9 5 - 15  CBC     Status: Abnormal   Collection Time: 12/11/22  2:04 AM  Result Value Ref Range   WBC 11.5 (H) 4.0 - 10.5 K/uL   RBC 5.42 (H) 3.87 - 5.11 MIL/uL   Hemoglobin 16.4 (H) 12.0 - 15.0 g/dL   HCT 62.1 (H) 30.8 - 65.7 %   MCV 86.0 80.0 - 100.0 fL   MCH 30.3 26.0 - 34.0 pg   MCHC 35.2 30.0 - 36.0 g/dL   RDW 84.6 96.2 - 95.2 %   Platelets 278 150 - 400 K/uL   nRBC 0.0 0.0 - 0.2 %  Respiratory (~20 pathogens) panel by PCR     Status: None   Collection Time: 12/11/22  8:36 AM   Specimen: Nasopharyngeal Swab; Respiratory  Result Value Ref Range   Adenovirus NOT DETECTED NOT DETECTED   Coronavirus 229E NOT DETECTED NOT DETECTED    Coronavirus HKU1 NOT DETECTED NOT DETECTED   Coronavirus NL63 NOT DETECTED NOT DETECTED   Coronavirus OC43 NOT DETECTED NOT DETECTED   Metapneumovirus NOT DETECTED NOT DETECTED   Rhinovirus / Enterovirus NOT DETECTED NOT DETECTED   Influenza A NOT DETECTED NOT DETECTED   Influenza B NOT DETECTED NOT DETECTED   Parainfluenza Virus 1 NOT DETECTED NOT DETECTED   Parainfluenza Virus 2 NOT DETECTED NOT DETECTED   Parainfluenza Virus 3 NOT DETECTED NOT DETECTED   Parainfluenza Virus 4 NOT DETECTED NOT DETECTED   Respiratory Syncytial Virus NOT DETECTED NOT DETECTED   Bordetella pertussis NOT DETECTED NOT DETECTED   Bordetella Parapertussis NOT DETECTED NOT DETECTED   Chlamydophila pneumoniae NOT DETECTED NOT DETECTED   Mycoplasma pneumoniae NOT DETECTED NOT DETECTED  Hepatic function panel     Status: Abnormal   Collection Time: 12/11/22  8:48 AM  Result Value Ref Range   Total Protein 6.2 (L) 6.5 - 8.1 g/dL   Albumin 3.5 3.5 - 5.0 g/dL   AST 18 15 - 41 U/L   ALT 12 0 - 44 U/L   Alkaline Phosphatase 79 38 - 126 U/L  Total Bilirubin 0.4 0.3 - 1.2 mg/dL   Bilirubin, Direct <9.1 0.0 - 0.2 mg/dL   Indirect Bilirubin NOT CALCULATED 0.3 - 0.9 mg/dL    I have reviewed pertinent nursing notes, vitals, labs, and images as necessary. I have ordered labwork to follow up on as indicated.  I have reviewed the last notes from staff over past 24 hours. I have discussed patient's care plan and test results with nursing staff, CM/SW, and other staff as appropriate.  Time spent: Greater than 50% of the 55 minute visit was spent in counseling/coordination of care for the patient as laid out in the A&P.   LOS: 1 day   Lewie Chamber, MD [facility] 12/11/2022, 2:02 PM

## 2022-12-11 NOTE — Assessment & Plan Note (Addendum)
Started nebulizers today. 

## 2022-12-11 NOTE — Assessment & Plan Note (Addendum)
-   Tachypnea, leukocytosis, suspected lung source - s/p Rocephin and azithromycin -Discharged on amoxicillin and doxycycline to complete course -Short course of prednisone also continued at discharge

## 2022-12-11 NOTE — Assessment & Plan Note (Addendum)
-  Continue Lipitor °

## 2022-12-11 NOTE — Assessment & Plan Note (Signed)
-   She does not seem to be in current exacerbation. - She will be placed on as needed and scheduled DuoNebs.

## 2022-12-11 NOTE — Progress Notes (Signed)
Pt called with complaints of nasal congested and productive cough, SOB with positive expiratory wheezing bilaterally on auscultations.   -Albuterol nebulizer PRN q 4 hrs -Mucinex 600 mg q 12 hrs, -Tussionex 10-8 mg q 12 hrs,  -Astelin 0.1% nasal spray  -3 LPM of NCL with humidifier were given.   Symptoms had relived. Pt was able to rest after interventions. We continue to monitor.  Filiberto Pinks, RN

## 2022-12-11 NOTE — Assessment & Plan Note (Addendum)
-   continue Zoloft and trazodone.

## 2022-12-12 DIAGNOSIS — E871 Hypo-osmolality and hyponatremia: Secondary | ICD-10-CM | POA: Diagnosis not present

## 2022-12-12 DIAGNOSIS — J189 Pneumonia, unspecified organism: Secondary | ICD-10-CM | POA: Diagnosis not present

## 2022-12-12 DIAGNOSIS — R7989 Other specified abnormal findings of blood chemistry: Secondary | ICD-10-CM | POA: Diagnosis not present

## 2022-12-12 DIAGNOSIS — J9601 Acute respiratory failure with hypoxia: Secondary | ICD-10-CM | POA: Diagnosis not present

## 2022-12-12 LAB — CBC WITH DIFFERENTIAL/PLATELET
Abs Immature Granulocytes: 0.07 10*3/uL (ref 0.00–0.07)
Basophils Absolute: 0.1 10*3/uL (ref 0.0–0.1)
Basophils Relative: 0 %
Eosinophils Absolute: 0.1 10*3/uL (ref 0.0–0.5)
Eosinophils Relative: 1 %
HCT: 42.4 % (ref 36.0–46.0)
Hemoglobin: 14.5 g/dL (ref 12.0–15.0)
Immature Granulocytes: 1 %
Lymphocytes Relative: 26 %
Lymphs Abs: 3.1 10*3/uL (ref 0.7–4.0)
MCH: 30.3 pg (ref 26.0–34.0)
MCHC: 34.2 g/dL (ref 30.0–36.0)
MCV: 88.7 fL (ref 80.0–100.0)
Monocytes Absolute: 0.8 10*3/uL (ref 0.1–1.0)
Monocytes Relative: 7 %
Neutro Abs: 8 10*3/uL — ABNORMAL HIGH (ref 1.7–7.7)
Neutrophils Relative %: 65 %
Platelets: 259 10*3/uL (ref 150–400)
RBC: 4.78 MIL/uL (ref 3.87–5.11)
RDW: 13.2 % (ref 11.5–15.5)
WBC: 12.1 10*3/uL — ABNORMAL HIGH (ref 4.0–10.5)
nRBC: 0 % (ref 0.0–0.2)

## 2022-12-12 LAB — BASIC METABOLIC PANEL
Anion gap: 6 (ref 5–15)
BUN: 8 mg/dL (ref 8–23)
CO2: 25 mmol/L (ref 22–32)
Calcium: 8.7 mg/dL — ABNORMAL LOW (ref 8.9–10.3)
Chloride: 100 mmol/L (ref 98–111)
Creatinine, Ser: 0.67 mg/dL (ref 0.44–1.00)
GFR, Estimated: >60 mL/min (ref >60)
Glucose, Bld: 91 mg/dL (ref 70–99)
Potassium: 4.1 mmol/L (ref 3.5–5.1)
Sodium: 131 mmol/L — ABNORMAL LOW (ref 135–145)

## 2022-12-12 LAB — T4, FREE: Free T4: 0.96 ng/dL (ref 0.61–1.12)

## 2022-12-12 LAB — ACTH STIMULATION, 3 TIME POINTS
Cortisol, 30 Min: 16 ug/dL
Cortisol, 60 Min: 20.1 ug/dL
Cortisol, Base: 9.6 ug/dL

## 2022-12-12 LAB — TSH: TSH: 2.611 u[IU]/mL (ref 0.350–4.500)

## 2022-12-12 LAB — ACTH: C206 ACTH: <1.5 pg/mL — ABNORMAL LOW (ref 7.2–63.3)

## 2022-12-12 LAB — MAGNESIUM: Magnesium: 1.9 mg/dL (ref 1.7–2.4)

## 2022-12-12 NOTE — Progress Notes (Signed)
Progress Note    Tammy Cowan   [id number]  DOB: [birth year]  DOA: 12/10/2022     2 PCP: Elizabeth Palau, FNP  Initial CC: SOB, cough  Hospital Course: Tammy Cowan is a 67 yo female with PMH ongoing tobacco use, depression, HTN who presented with worsening shortness of breath and cough.  Symptoms had progressively worsened over 2 to 3 days prior to admission. CXR obtained showed mild diffuse interstitial opacities and left peripheral lung base opacity concerning for possible infection. She had mild tachypnea and leukocytosis on admission.  She was started on antibiotics and admitted for further observation.  Also found to be hypoxic and placed on 3 L oxygen.  Interval History:  No events overnight. Still SOB. Ambulated with staff and had desat to 78% on RA.  Remains on 2 L oxygen for now.  Assessment and Plan: * Sepsis due to pneumonia (HCC) - Tachypnea, leukocytosis, suspected lung source - Continue Rocephin and azithromycin  Hyponatremia - Differential includes volume depletion/prerenal versus adrenal insufficiency - Continue fluids - BMP in am  Low serum cortisol level - concerning associated findings include hyponatremia, lethargy, severely low cortisol level - however, appears she received solu-medrol 125 mg IM on 5/8 - ACTH likely suppressed by negative feedback and cortisol suspected low from acute illness - ACTH stim test technically normal (could be false workup from solumedrol vs true central AI but not burned out adrenals yet) - check TSH and FT4 (normal) - repeat ACTH on 5/11, should be rebounding  Acute respiratory failure with hypoxia (HCC) - suspected due to CAP - continue abx and O2; wean as able - will need walk test prior to d/c -Hold off on steroids for now until ACTH stim test complete  Chronic obstructive pulmonary disease (COPD) (HCC) - Continue nebulizers  Tobacco use -cessation counseling performed - continue nicotine patch  Essential  hypertension - We will continue her Diovan and hold off HCTZ.  Depression - continue Zoloft and trazodone.  Dyslipidemia - Continue Lipitor  Old records reviewed in assessment of this patient  Antimicrobials: Azithromycin 12/10/2022 >> current Rocephin 12/10/2022 >> current  DVT prophylaxis:  enoxaparin (LOVENOX) injection 40 mg Start: 12/11/22 1000   Code Status:   Code Status: Full Code  Mobility Assessment (last 72 hours)     Mobility Assessment     Row Name 12/12/22 0845 12/11/22 0930 12/10/22 2211       Does patient have an order for bedrest or is patient medically unstable No - Continue assessment No - Continue assessment No - Continue assessment     What is the highest level of mobility based on the progressive mobility assessment? Level 6 (Walks independently in room and hall) - Balance while walking in room without assist - Complete Level 6 (Walks independently in room and hall) - Balance while walking in room without assist - Complete Level 5 (Walks with assist in room/hall) - Balance while stepping forward/back and can walk in room with assist - Complete              Barriers to discharge: none Disposition Plan:  Home Status is: Inpt  Objective: Blood pressure 127/80, pulse 78, temperature 98.7 F (37.1 C), temperature source Oral, resp. rate 16, height 5\' 2"  (1.575 m), weight 92.8 kg, SpO2 94 %.  Examination:  Physical Exam Constitutional:      General: She is not in acute distress.    Appearance: Normal appearance.  HENT:     Head: Normocephalic and  atraumatic.     Mouth/Throat:     Mouth: Mucous membranes are moist.  Eyes:     Extraocular Movements: Extraocular movements intact.  Cardiovascular:     Rate and Rhythm: Normal rate and regular rhythm.  Pulmonary:     Effort: Pulmonary effort is normal. No respiratory distress.     Breath sounds: Rhonchi present. No wheezing.  Abdominal:     General: Bowel sounds are normal. There is no distension.      Palpations: Abdomen is soft.     Tenderness: There is no abdominal tenderness.  Musculoskeletal:        General: Normal range of motion.     Cervical back: Normal range of motion and neck supple.  Skin:    General: Skin is warm and dry.  Neurological:     General: No focal deficit present.     Mental Status: She is alert.  Psychiatric:        Mood and Affect: Mood normal.        Behavior: Behavior normal.      Consultants:    Procedures:    Data Reviewed: Results for orders placed or performed during the hospital encounter of 12/10/22 (from the past 24 hour(s))  TSH     Status: None   Collection Time: 12/12/22  1:25 AM  Result Value Ref Range   TSH 2.611 0.350 - 4.500 uIU/mL  T4, free     Status: None   Collection Time: 12/12/22  1:25 AM  Result Value Ref Range   Free T4 0.96 0.61 - 1.12 ng/dL  Basic metabolic panel     Status: Abnormal   Collection Time: 12/12/22  1:28 AM  Result Value Ref Range   Sodium 131 (L) 135 - 145 mmol/L   Potassium 4.1 3.5 - 5.1 mmol/L   Chloride 100 98 - 111 mmol/L   CO2 25 22 - 32 mmol/L   Glucose, Bld 91 70 - 99 mg/dL   BUN 8 8 - 23 mg/dL   Creatinine, Ser 1.61 0.44 - 1.00 mg/dL   Calcium 8.7 (L) 8.9 - 10.3 mg/dL   GFR, Estimated >09 >60 mL/min   Anion gap 6 5 - 15  CBC with Differential/Platelet     Status: Abnormal   Collection Time: 12/12/22  1:28 AM  Result Value Ref Range   WBC 12.1 (H) 4.0 - 10.5 K/uL   RBC 4.78 3.87 - 5.11 MIL/uL   Hemoglobin 14.5 12.0 - 15.0 g/dL   HCT 45.4 09.8 - 11.9 %   MCV 88.7 80.0 - 100.0 fL   MCH 30.3 26.0 - 34.0 pg   MCHC 34.2 30.0 - 36.0 g/dL   RDW 14.7 82.9 - 56.2 %   Platelets 259 150 - 400 K/uL   nRBC 0.0 0.0 - 0.2 %   Neutrophils Relative % 65 %   Neutro Abs 8.0 (H) 1.7 - 7.7 K/uL   Lymphocytes Relative 26 %   Lymphs Abs 3.1 0.7 - 4.0 K/uL   Monocytes Relative 7 %   Monocytes Absolute 0.8 0.1 - 1.0 K/uL   Eosinophils Relative 1 %   Eosinophils Absolute 0.1 0.0 - 0.5 K/uL   Basophils  Relative 0 %   Basophils Absolute 0.1 0.0 - 0.1 K/uL   Immature Granulocytes 1 %   Abs Immature Granulocytes 0.07 0.00 - 0.07 K/uL  Magnesium     Status: None   Collection Time: 12/12/22  1:28 AM  Result Value Ref Range   Magnesium  1.9 1.7 - 2.4 mg/dL  ACTH stimulation, 3 time points     Status: None   Collection Time: 12/12/22  7:38 AM  Result Value Ref Range   Cortisol, Base 9.6 ug/dL   Cortisol, 30 Min 16.1 ug/dL   Cortisol, 60 Min 09.6 ug/dL    I have reviewed pertinent nursing notes, vitals, labs, and images as necessary. I have ordered labwork to follow up on as indicated.  I have reviewed the last notes from staff over past 24 hours. I have discussed patient's care plan and test results with nursing staff, CM/SW, and other staff as appropriate.  Time spent: Greater than 50% of the 55 minute visit was spent in counseling/coordination of care for the patient as laid out in the A&P.   LOS: 2 days   Lewie Chamber, MD [facility] 12/12/2022, 3:43 PM

## 2022-12-12 NOTE — Progress Notes (Signed)
SATURATION QUALIFICATIONS: (This note is used to comply with regulatory documentation for home oxygen)  Patient Saturations on Room Air at Rest = 85%  Patient Saturations on Room Air while Ambulating = 78%  Patient Saturations on 2 Liters of oxygen while Ambulating = 90%  Patient was having lightheaded when ambulating

## 2022-12-13 DIAGNOSIS — J9601 Acute respiratory failure with hypoxia: Secondary | ICD-10-CM | POA: Diagnosis not present

## 2022-12-13 DIAGNOSIS — J189 Pneumonia, unspecified organism: Secondary | ICD-10-CM | POA: Diagnosis not present

## 2022-12-13 DIAGNOSIS — E871 Hypo-osmolality and hyponatremia: Secondary | ICD-10-CM | POA: Diagnosis not present

## 2022-12-13 DIAGNOSIS — R7989 Other specified abnormal findings of blood chemistry: Secondary | ICD-10-CM | POA: Diagnosis not present

## 2022-12-13 LAB — BASIC METABOLIC PANEL
Anion gap: 11 (ref 5–15)
BUN: 9 mg/dL (ref 8–23)
CO2: 27 mmol/L (ref 22–32)
Calcium: 9.1 mg/dL (ref 8.9–10.3)
Chloride: 96 mmol/L — ABNORMAL LOW (ref 98–111)
Creatinine, Ser: 0.67 mg/dL (ref 0.44–1.00)
GFR, Estimated: >60 mL/min (ref >60)
Glucose, Bld: 89 mg/dL (ref 70–99)
Potassium: 4.1 mmol/L (ref 3.5–5.1)
Sodium: 134 mmol/L — ABNORMAL LOW (ref 135–145)

## 2022-12-13 LAB — CBC WITH DIFFERENTIAL/PLATELET
Abs Immature Granulocytes: 0.07 10*3/uL (ref 0.00–0.07)
Basophils Absolute: 0.1 10*3/uL (ref 0.0–0.1)
Basophils Relative: 1 %
Eosinophils Absolute: 0.2 10*3/uL (ref 0.0–0.5)
Eosinophils Relative: 2 %
HCT: 45.9 % (ref 36.0–46.0)
Hemoglobin: 15.2 g/dL — ABNORMAL HIGH (ref 12.0–15.0)
Immature Granulocytes: 1 %
Lymphocytes Relative: 26 %
Lymphs Abs: 2.6 10*3/uL (ref 0.7–4.0)
MCH: 29.2 pg (ref 26.0–34.0)
MCHC: 33.1 g/dL (ref 30.0–36.0)
MCV: 88.3 fL (ref 80.0–100.0)
Monocytes Absolute: 0.9 10*3/uL (ref 0.1–1.0)
Monocytes Relative: 9 %
Neutro Abs: 6 10*3/uL (ref 1.7–7.7)
Neutrophils Relative %: 61 %
Platelets: 259 10*3/uL (ref 150–400)
RBC: 5.2 MIL/uL — ABNORMAL HIGH (ref 3.87–5.11)
RDW: 13.1 % (ref 11.5–15.5)
WBC: 9.8 10*3/uL (ref 4.0–10.5)
nRBC: 0 % (ref 0.0–0.2)

## 2022-12-13 LAB — MAGNESIUM: Magnesium: 1.9 mg/dL (ref 1.7–2.4)

## 2022-12-13 MED ORDER — METHYLPREDNISOLONE SODIUM SUCC 40 MG IJ SOLR
40.0000 mg | Freq: Every day | INTRAMUSCULAR | Status: DC
  Administered 2022-12-13 – 2022-12-14 (×2): 40 mg via INTRAVENOUS
  Filled 2022-12-13 (×2): qty 1

## 2022-12-13 MED ORDER — IPRATROPIUM-ALBUTEROL 0.5-2.5 (3) MG/3ML IN SOLN
3.0000 mL | Freq: Three times a day (TID) | RESPIRATORY_TRACT | Status: DC
  Filled 2022-12-13: qty 3

## 2022-12-13 MED ORDER — IPRATROPIUM-ALBUTEROL 0.5-2.5 (3) MG/3ML IN SOLN: 3.0000 mL | RESPIRATORY_TRACT | Status: DC | PRN

## 2022-12-13 NOTE — Progress Notes (Signed)
SATURATION QUALIFICATIONS: (This note is used to comply with regulatory documentation for home oxygen)  Patient Saturations on Room Air at Rest = 89%  Patient Saturations on Room Air while Ambulating = 86%  Patient Saturations on 1 Liters of oxygen while Ambulating = 91%

## 2022-12-13 NOTE — Progress Notes (Signed)
Progress Note    IN SHIDLER   [id number]  DOB: Mar 11, 1956  DOA: 12/10/2022     3 PCP: Elizabeth Palau, FNP  Initial CC: SOB, cough  Hospital Course: [name] is a 67 yo female with PMH ongoing tobacco use, depression, HTN who presented with worsening shortness of breath and cough.  Symptoms had progressively worsened over 2 to 3 days prior to admission. CXR obtained showed mild diffuse interstitial opacities and left peripheral lung base opacity concerning for possible infection. She had mild tachypnea and leukocytosis on admission.  She was started on antibiotics and admitted for further observation.  Also found to be hypoxic and placed on 3 L oxygen.  Interval History:  No events overnight.  Shortness of breath slowly improving.  Still had desaturation with ambulation but less than yesterday.  Continues to show some signs of improvement daily.  Assessment and Plan: * Sepsis due to pneumonia (HCC)-resolved as of 12/13/2022 - Tachypnea, leukocytosis, suspected lung source - Continue Rocephin and azithromycin  Hyponatremia - Differential includes volume depletion/prerenal versus adrenal insufficiency - s/p fluids - BMP in am - creat has been improving   Low serum cortisol level - concerning associated findings include hyponatremia, lethargy, severely low cortisol level - however, appears she received solu-medrol 125 mg IM on 5/8 - ACTH likely suppressed from exogenous steroid use prior to admission; other considered differential included central adrenal insufficiency  - ACTH stim test technically normal (could be false workup from solumedrol vs true central AI but not burned out adrenals yet) - check TSH and FT4 (normal) - repeat ACTH on 5/11, should be rebounding  Acute respiratory failure with hypoxia (HCC) - suspected due to CAP - continue abx and O2; wean as able - improving slowly without steroids - walk test daily; keep weaning as able   Chronic obstructive  pulmonary disease (COPD) (HCC) - Continue nebulizers  Tobacco use -cessation counseling performed - continue nicotine patch  Essential hypertension - We will continue her Diovan and hold off HCTZ.  Depression - continue Zoloft and trazodone.  Dyslipidemia - Continue Lipitor  Old records reviewed in assessment of this patient  Antimicrobials: Azithromycin 12/10/2022 >> current Rocephin 12/10/2022 >> current  DVT prophylaxis:  enoxaparin (LOVENOX) injection 40 mg Start: 12/11/22 1000   Code Status:   Code Status: Full Code  Mobility Assessment (last 72 hours)     Mobility Assessment     Row Name 12/13/22 0745 12/12/22 2015 12/12/22 0845 12/11/22 0930 12/10/22 2211   Does patient have an order for bedrest or is patient medically unstable No - Continue assessment No - Continue assessment No - Continue assessment No - Continue assessment No - Continue assessment   What is the highest level of mobility based on the progressive mobility assessment? Level 6 (Walks independently in room and hall) - Balance while walking in room without assist - Complete Level 6 (Walks independently in room and hall) - Balance while walking in room without assist - Complete Level 6 (Walks independently in room and hall) - Balance while walking in room without assist - Complete Level 6 (Walks independently in room and hall) - Balance while walking in room without assist - Complete Level 5 (Walks with assist in room/hall) - Balance while stepping forward/back and can walk in room with assist - Complete            Barriers to discharge: none Disposition Plan:  Home Status is: Inpt  Objective: Blood pressure 132/71, pulse 73, temperature  98.9 F (37.2 C), temperature source Oral, resp. rate 14, height 5\' 2"  (1.575 m), weight 92.8 kg, SpO2 92 %.  Examination:  Physical Exam Constitutional:      General: She is not in acute distress.    Appearance: Normal appearance.  HENT:     Head: Normocephalic  and atraumatic.     Mouth/Throat:     Mouth: Mucous membranes are moist.  Eyes:     Extraocular Movements: Extraocular movements intact.  Cardiovascular:     Rate and Rhythm: Normal rate and regular rhythm.  Pulmonary:     Effort: Pulmonary effort is normal. No respiratory distress.     Breath sounds: Rhonchi present. No wheezing.  Abdominal:     General: Bowel sounds are normal. There is no distension.     Palpations: Abdomen is soft.     Tenderness: There is no abdominal tenderness.  Musculoskeletal:        General: Normal range of motion.     Cervical back: Normal range of motion and neck supple.  Skin:    General: Skin is warm and dry.  Neurological:     General: No focal deficit present.     Mental Status: She is alert.  Psychiatric:        Mood and Affect: Mood normal.        Behavior: Behavior normal.      Consultants:    Procedures:    Data Reviewed: Results for orders placed or performed during the hospital encounter of 12/10/22 (from the past 24 hour(s))  Basic metabolic panel     Status: Abnormal   Collection Time: 12/13/22  1:13 AM  Result Value Ref Range   Sodium 134 (L) 135 - 145 mmol/L   Potassium 4.1 3.5 - 5.1 mmol/L   Chloride 96 (L) 98 - 111 mmol/L   CO2 27 22 - 32 mmol/L   Glucose, Bld 89 70 - 99 mg/dL   BUN 9 8 - 23 mg/dL   Creatinine, Ser 9.14 0.44 - 1.00 mg/dL   Calcium 9.1 8.9 - 78.2 mg/dL   GFR, Estimated >95 >62 mL/min   Anion gap 11 5 - 15  CBC with Differential/Platelet     Status: Abnormal   Collection Time: 12/13/22  1:13 AM  Result Value Ref Range   WBC 9.8 4.0 - 10.5 K/uL   RBC 5.20 (H) 3.87 - 5.11 MIL/uL   Hemoglobin 15.2 (H) 12.0 - 15.0 g/dL   HCT 13.0 86.5 - 78.4 %   MCV 88.3 80.0 - 100.0 fL   MCH 29.2 26.0 - 34.0 pg   MCHC 33.1 30.0 - 36.0 g/dL   RDW 69.6 29.5 - 28.4 %   Platelets 259 150 - 400 K/uL   nRBC 0.0 0.0 - 0.2 %   Neutrophils Relative % 61 %   Neutro Abs 6.0 1.7 - 7.7 K/uL   Lymphocytes Relative 26 %    Lymphs Abs 2.6 0.7 - 4.0 K/uL   Monocytes Relative 9 %   Monocytes Absolute 0.9 0.1 - 1.0 K/uL   Eosinophils Relative 2 %   Eosinophils Absolute 0.2 0.0 - 0.5 K/uL   Basophils Relative 1 %   Basophils Absolute 0.1 0.0 - 0.1 K/uL   Immature Granulocytes 1 %   Abs Immature Granulocytes 0.07 0.00 - 0.07 K/uL  Magnesium     Status: None   Collection Time: 12/13/22  1:13 AM  Result Value Ref Range   Magnesium 1.9 1.7 - 2.4 mg/dL  I have reviewed pertinent nursing notes, vitals, labs, and images as necessary. I have ordered labwork to follow up on as indicated.  I have reviewed the last notes from staff over past 24 hours. I have discussed patient's care plan and test results with nursing staff, CM/SW, and other staff as appropriate.  Time spent: Greater than 50% of the 55 minute visit was spent in counseling/coordination of care for the patient as laid out in the A&P.   LOS: 3 days   Lewie Chamber, MD [facility] 12/13/2022, 5:43 PM

## 2022-12-14 ENCOUNTER — Encounter: Payer: Self-pay | Admitting: Internal Medicine

## 2022-12-14 DIAGNOSIS — R7989 Other specified abnormal findings of blood chemistry: Secondary | ICD-10-CM | POA: Diagnosis not present

## 2022-12-14 DIAGNOSIS — E871 Hypo-osmolality and hyponatremia: Secondary | ICD-10-CM | POA: Diagnosis not present

## 2022-12-14 DIAGNOSIS — J9601 Acute respiratory failure with hypoxia: Secondary | ICD-10-CM | POA: Diagnosis not present

## 2022-12-14 DIAGNOSIS — J189 Pneumonia, unspecified organism: Secondary | ICD-10-CM | POA: Diagnosis not present

## 2022-12-14 LAB — CBC WITH DIFFERENTIAL/PLATELET
Abs Immature Granulocytes: 0.07 10*3/uL (ref 0.00–0.07)
Basophils Absolute: 0 10*3/uL (ref 0.0–0.1)
Basophils Relative: 1 %
Eosinophils Absolute: 0 10*3/uL (ref 0.0–0.5)
Eosinophils Relative: 0 %
HCT: 49.8 % — ABNORMAL HIGH (ref 36.0–46.0)
Hemoglobin: 16.8 g/dL — ABNORMAL HIGH (ref 12.0–15.0)
Immature Granulocytes: 1 %
Lymphocytes Relative: 10 %
Lymphs Abs: 0.9 10*3/uL (ref 0.7–4.0)
MCH: 29.5 pg (ref 26.0–34.0)
MCHC: 33.7 g/dL (ref 30.0–36.0)
MCV: 87.4 fL (ref 80.0–100.0)
Monocytes Absolute: 0.1 10*3/uL (ref 0.1–1.0)
Monocytes Relative: 1 %
Neutro Abs: 7.4 10*3/uL (ref 1.7–7.7)
Neutrophils Relative %: 87 %
Platelets: 299 10*3/uL (ref 150–400)
RBC: 5.7 MIL/uL — ABNORMAL HIGH (ref 3.87–5.11)
RDW: 12.7 % (ref 11.5–15.5)
WBC: 8.5 10*3/uL (ref 4.0–10.5)
nRBC: 0 % (ref 0.0–0.2)

## 2022-12-14 LAB — BASIC METABOLIC PANEL
Anion gap: 12 (ref 5–15)
BUN: 10 mg/dL (ref 8–23)
CO2: 23 mmol/L (ref 22–32)
Calcium: 9.4 mg/dL (ref 8.9–10.3)
Chloride: 97 mmol/L — ABNORMAL LOW (ref 98–111)
Creatinine, Ser: 0.68 mg/dL (ref 0.44–1.00)
GFR, Estimated: >60 mL/min (ref >60)
Glucose, Bld: 157 mg/dL — ABNORMAL HIGH (ref 70–99)
Potassium: 4.2 mmol/L (ref 3.5–5.1)
Sodium: 132 mmol/L — ABNORMAL LOW (ref 135–145)

## 2022-12-14 LAB — MAGNESIUM: Magnesium: 2.1 mg/dL (ref 1.7–2.4)

## 2022-12-14 MED ORDER — DOXYCYCLINE HYCLATE 100 MG PO TABS: 100.0000 mg | ORAL_TABLET | Freq: Two times a day (BID) | ORAL | 0 refills | Status: AC

## 2022-12-14 MED ORDER — AMOXICILLIN 875 MG PO TABS: 875.0000 mg | ORAL_TABLET | Freq: Two times a day (BID) | ORAL | 0 refills | Status: AC

## 2022-12-14 MED ORDER — PREDNISONE 20 MG PO TABS: 40.0000 mg | ORAL_TABLET | Freq: Every day | ORAL | 0 refills | Status: AC

## 2022-12-14 MED ORDER — NICOTINE 14 MG/24HR TD PT24: 14.0000 mg | MEDICATED_PATCH | Freq: Every day | TRANSDERMAL | 2 refills | Status: DC

## 2022-12-14 NOTE — Discharge Summary (Signed)
Physician Discharge Summary   Tammy Cowan ZOX:[id number] DOB: 1956/03/29 DOA: 12/10/2022  PCP: Elder Negus, NP  Admit date: 12/10/2022 Discharge date: 12/14/2022   Admitted From: Home Disposition:  Home Discharging physician: Lewie Chamber, MD Barriers to discharge: none  Recommendations at discharge: Follow up repeat ACTH level; if still suppressed may need repeat stim test vs endo referral Follow up BMP/Na, if still low may need to d/c HCTZ   Discharge Condition: stable CODE STATUS: Full Diet recommendation:  Diet Orders (From admission, onward)     Start     Ordered   12/14/22 0000  Diet general        12/14/22 0923   12/11/22 1636  Diet regular Room service appropriate? Yes; Fluid consistency: Thin  Diet effective now       Question Answer Comment  Room service appropriate? Yes   Fluid consistency: Thin      12/11/22 1635            Hospital Course: Tammy Cowan is a 67 yo female with PMH ongoing tobacco use, depression, HTN who presented with worsening shortness of breath and cough.  Symptoms had progressively worsened over 2 to 3 days prior to admission. CXR obtained showed mild diffuse interstitial opacities and left peripheral lung base opacity concerning for possible infection. She had mild tachypnea and leukocytosis on admission.  She was started on antibiotics and admitted for further observation.  Also found to be hypoxic and placed on 3 L oxygen. She was able to be weaned off of oxygen prior to discharge and ambulated with adequate oxygen saturations.  She was continued on antibiotics and steroids to complete course at discharge.  Assessment and Plan: * Sepsis due to pneumonia (HCC)-resolved as of 12/13/2022 - Tachypnea, leukocytosis, suspected lung source - s/p Rocephin and azithromycin -Discharged on amoxicillin and doxycycline to complete course -Short course of prednisone also continued at discharge  Hyponatremia - Differential includes volume  depletion/prerenal versus adrenal insufficiency - s/p fluids - sodium has been improving  - follow up ACTH; repeated and pending at discharge  Low serum cortisol level - concerning associated findings include hyponatremia, lethargy, severely low cortisol level - however, appears she received solu-medrol 125 mg IM on 5/8 - ACTH likely suppressed from exogenous steroid use prior to admission; other considered differential included central adrenal insufficiency  - ACTH stim test technically normal (could be false workup from solumedrol vs true central AI but not burned out adrenals yet) - check TSH and FT4 (normal) - repeat ACTH on 5/11, should be rebounding  Acute respiratory failure with hypoxia (HCC)-resolved as of 12/14/2022 - suspected due to CAP - continue abx and O2; wean as able -Oxygen saturations improved slowly each day.  She was able to ambulate on room air without dropping below 88%  Chronic obstructive pulmonary disease (COPD) (HCC) - Continue nebulizers  Tobacco use -cessation counseling performed - continue nicotine patch  Essential hypertension - Resume home regimen  Depression - continue Zoloft and trazodone.  Dyslipidemia - Continue Lipitor  The patient's chronic medical conditions were treated accordingly per the patient's home medication regimen except as noted.  On day of discharge, patient was felt deemed stable for discharge. Patient/family member advised to call PCP or come back to ER if needed.   Principal Diagnosis: Sepsis due to pneumonia [facility])  Discharge Diagnoses: Active Hospital Problems   Diagnosis Date Noted   Hyponatremia 12/11/2022    Priority: 2.   Low serum cortisol level 12/11/2022  Priority: 3.   Chronic obstructive pulmonary disease (COPD) (HCC) 12/11/2022    Priority: 4.   Dyslipidemia 12/11/2022   Depression 12/11/2022   Essential hypertension 12/11/2022   Tobacco use 12/11/2022   CAP (community acquired pneumonia) 12/10/2022     Resolved Hospital Problems   Diagnosis Date Noted Date Resolved   Sepsis due to pneumonia [facility]) 12/10/2022 12/13/2022    Priority: 1.   Acute respiratory failure with hypoxia (HCC) 12/11/2022 12/14/2022    Priority: 3.     Discharge Instructions     Diet general   Complete by: As directed    Increase activity slowly   Complete by: As directed       Allergies as of 12/14/2022       Reactions   Crestor [rosuvastatin Calcium] Diarrhea   Pt states diarrhea for months after taking crestor   Venlafaxine Other (See Comments)   Felt bad on it   Vibegron         Medication List     STOP taking these medications    valsartan 80 MG tablet Commonly known as: DIOVAN       TAKE these medications    albuterol 108 (90 Base) MCG/ACT inhaler Commonly known as: VENTOLIN HFA Inhale 2 puffs into the lungs every 4 (four) hours as needed for wheezing or shortness of breath.   amoxicillin 875 MG tablet Commonly known as: AMOXIL Take 1 tablet (875 mg total) by mouth 2 (two) times daily for 3 days.   atorvastatin 10 MG tablet Commonly known as: LIPITOR Take 10 mg by mouth daily.   azelastine 0.1 % nasal spray Commonly known as: ASTELIN Place 1 spray into both nostrils 2 (two) times daily. Use in each nostril as directed   Biotin 1 MG Caps Take by mouth.   Breo Ellipta 200-25 MCG/ACT Aepb Generic drug: fluticasone furoate-vilanterol Inhale 1 puff into the lungs daily.   busPIRone 10 MG tablet Commonly known as: BUSPAR Take 10 mg by mouth 3 (three) times daily.   calcium carbonate 1250 (500 Ca) MG tablet Commonly known as: OS-CAL - dosed in mg of elemental calcium Take 1 tablet by mouth.   cholecalciferol 25 MCG (1000 UNIT) tablet Commonly known as: VITAMIN D3 Take 1,000 Units by mouth daily.   doxycycline 100 MG tablet Commonly known as: VIBRA-TABS Take 1 tablet (100 mg total) by mouth 2 (two) times daily for 3 days.   ipratropium-albuterol 0.5-2.5 (3) MG/3ML  Soln Commonly known as: DUONEB Take 3 mLs by nebulization every 6 (six) hours as needed.   levocetirizine 5 MG tablet Commonly known as: XYZAL Take 5 mg by mouth every evening.   Myrbetriq 50 MG Tb24 tablet Generic drug: mirabegron ER Take 50 mg by mouth daily.   nicotine 14 mg/24hr patch Commonly known as: NICODERM CQ - dosed in mg/24 hours Place 1 patch (14 mg total) onto the skin daily.   predniSONE 20 MG tablet Commonly known as: DELTASONE Take 2 tablets (40 mg total) by mouth daily for 3 days.   sertraline 100 MG tablet Commonly known as: ZOLOFT Take 100 mg by mouth daily.   traZODone 50 MG tablet Commonly known as: DESYREL Take 50 mg by mouth at bedtime.   valsartan-hydrochlorothiazide 160-12.5 MG tablet Commonly known as: DIOVAN-HCT Take 1 tablet by mouth daily.   Vibegron 75 MG Tabs Take 75 mg by mouth daily.   vitamin B-12 100 MCG tablet Commonly known as: CYANOCOBALAMIN Take 100 mcg by mouth daily.  Allergies  Allergen Reactions   Crestor [Rosuvastatin Calcium] Diarrhea    Pt states diarrhea for months after taking crestor   Venlafaxine Other (See Comments)    Felt bad on it   Vibegron     Consultations:   Procedures:   Discharge Exam: BP 134/77 (BP Location: Right Arm)   Pulse 89   Temp 97.8 F (36.6 C) (Oral)   Resp 17   Ht 5\' 2"  (1.575 m)   Wt 92.8 kg   SpO2 94%   BMI 37.42 kg/m  Physical Exam Constitutional:      General: She is not in acute distress.    Appearance: Normal appearance.  HENT:     Head: Normocephalic and atraumatic.     Mouth/Throat:     Mouth: Mucous membranes are moist.  Eyes:     Extraocular Movements: Extraocular movements intact.  Cardiovascular:     Rate and Rhythm: Normal rate and regular rhythm.  Pulmonary:     Effort: Pulmonary effort is normal. No respiratory distress.     Breath sounds: Rhonchi (improved) present. No wheezing.  Abdominal:     General: Bowel sounds are normal. There is no  distension.     Palpations: Abdomen is soft.     Tenderness: There is no abdominal tenderness.  Musculoskeletal:        General: Normal range of motion.     Cervical back: Normal range of motion and neck supple.  Skin:    General: Skin is warm and dry.  Neurological:     General: No focal deficit present.     Mental Status: She is alert.  Psychiatric:        Mood and Affect: Mood normal.        Behavior: Behavior normal.      The results of significant diagnostics from this hospitalization (including imaging, microbiology, ancillary and laboratory) are listed below for reference.   Microbiology: Recent Results (from the past 240 hour(s))  Resp panel by RT-PCR (RSV, Flu A&B, Covid) Anterior Nasal Swab     Status: None   Collection Time: 12/10/22  7:55 PM   Specimen: Anterior Nasal Swab  Result Value Ref Range Status   SARS Coronavirus 2 by RT PCR NEGATIVE NEGATIVE Final    Comment: (NOTE) SARS-CoV-2 target nucleic acids are NOT DETECTED.  The SARS-CoV-2 RNA is generally detectable in upper respiratory specimens during the acute phase of infection. The lowest concentration of SARS-CoV-2 viral copies this assay can detect is 138 copies/mL. A negative result does not preclude SARS-Cov-2 infection and should not be used as the sole basis for treatment or other patient management decisions. A negative result may occur with  improper specimen collection/handling, submission of specimen other than nasopharyngeal swab, presence of viral mutation(s) within the areas targeted by this assay, and inadequate number of viral copies(<138 copies/mL). A negative result must be combined with clinical observations, patient history, and epidemiological information. The expected result is Negative.  Fact Sheet for Patients:  BloggerCourse.com  Fact Sheet for Healthcare Providers:  SeriousBroker.it  This test is no t yet approved or cleared by  the [place] FDA and  has been authorized for detection and/or diagnosis of SARS-CoV-2 by FDA under an Emergency Use Authorization (EUA). This EUA will remain  in effect (meaning this test can be used) for the duration of the COVID-19 declaration under Section 564(b)(1) of the Act, 21 U.S.C.section 360bbb-3(b)(1), unless the authorization is terminated  or revoked sooner.  Influenza A by PCR NEGATIVE NEGATIVE Final   Influenza B by PCR NEGATIVE NEGATIVE Final    Comment: (NOTE) The Xpert Xpress SARS-CoV-2/FLU/RSV plus assay is intended as an aid in the diagnosis of influenza from Nasopharyngeal swab specimens and should not be used as a sole basis for treatment. Nasal washings and aspirates are unacceptable for Xpert Xpress SARS-CoV-2/FLU/RSV testing.  Fact Sheet for Patients: BloggerCourse.com  Fact Sheet for Healthcare Providers: SeriousBroker.it  This test is not yet approved or cleared by the [place] FDA and has been authorized for detection and/or diagnosis of SARS-CoV-2 by FDA under an Emergency Use Authorization (EUA). This EUA will remain in effect (meaning this test can be used) for the duration of the COVID-19 declaration under Section 564(b)(1) of the Act, 21 U.S.C. section 360bbb-3(b)(1), unless the authorization is terminated or revoked.     Resp Syncytial Virus by PCR NEGATIVE NEGATIVE Final    Comment: (NOTE) Fact Sheet for Patients: BloggerCourse.com  Fact Sheet for Healthcare Providers: SeriousBroker.it  This test is not yet approved or cleared by the [place] FDA and has been authorized for detection and/or diagnosis of SARS-CoV-2 by FDA under an Emergency Use Authorization (EUA). This EUA will remain in effect (meaning this test can be used) for the duration of the COVID-19 declaration under Section 564(b)(1) of the Act, 21  U.S.C. section 360bbb-3(b)(1), unless the authorization is terminated or revoked.  Performed at [facility], [address] [place], [place] [address]   Respiratory (~20 pathogens) panel by PCR     Status: None   Collection Time: 12/11/22  8:36 AM   Specimen: Nasopharyngeal Swab; Respiratory  Result Value Ref Range Status   Adenovirus NOT DETECTED NOT DETECTED Final   Coronavirus 229E NOT DETECTED NOT DETECTED Final    Comment: (NOTE) The Coronavirus on the Respiratory Panel, DOES NOT test for the novel  Coronavirus (2019 nCoV)    Coronavirus HKU1 NOT DETECTED NOT DETECTED Final   Coronavirus NL63 NOT DETECTED NOT DETECTED Final   Coronavirus OC43 NOT DETECTED NOT DETECTED Final   Metapneumovirus NOT DETECTED NOT DETECTED Final   Rhinovirus / Enterovirus NOT DETECTED NOT DETECTED Final   Influenza A NOT DETECTED NOT DETECTED Final   Influenza B NOT DETECTED NOT DETECTED Final   Parainfluenza Virus 1 NOT DETECTED NOT DETECTED Final   Parainfluenza Virus 2 NOT DETECTED NOT DETECTED Final   Parainfluenza Virus 3 NOT DETECTED NOT DETECTED Final   Parainfluenza Virus 4 NOT DETECTED NOT DETECTED Final   Respiratory Syncytial Virus NOT DETECTED NOT DETECTED Final   Bordetella pertussis NOT DETECTED NOT DETECTED Final   Bordetella Parapertussis NOT DETECTED NOT DETECTED Final   Chlamydophila pneumoniae NOT DETECTED NOT DETECTED Final   Mycoplasma pneumoniae NOT DETECTED NOT DETECTED Final    Comment: Performed at [facility], [address], [place], [place] [address]     Labs: BNP (last 3 results) Recent Labs    12/10/22 1801  BNP 42.6   Basic Metabolic Panel: Recent Labs  Lab 12/10/22 1801 12/11/22 0204 12/12/22 0128 12/13/22 0113 12/14/22 0109  NA 129* 128* 131* 134* 132*  K 4.1 4.1 4.1 4.1 4.2  CL 94* 97* 100 96* 97*  CO2 25 22 25 27 23   GLUCOSE 114* 163* 91 89 157*  BUN 14 11 8 9 10   CREATININE 0.80 0.66 0.67 0.67 0.68  CALCIUM 9.7  9.2 8.7* 9.1 9.4  MG  --   --  1.9 1.9 2.1  Liver Function Tests: Recent Labs  Lab 12/11/22 0848  AST 18  ALT 12  ALKPHOS 79  BILITOT 0.4  PROT 6.2*  ALBUMIN 3.5   No results for input(s): "LIPASE", "AMYLASE" in the last 168 hours. No results for input(s): "AMMONIA" in the last 168 hours. CBC: Recent Labs  Lab 12/10/22 1801 12/11/22 0204 12/12/22 0128 12/13/22 0113 12/14/22 0109  WBC 11.9* 11.5* 12.1* 9.8 8.5  NEUTROABS 9.6*  --  8.0* 6.0 7.4  HGB 16.4* 16.4* 14.5 15.2* 16.8*  HCT 48.0* 46.6* 42.4 45.9 49.8*  MCV 88.2 86.0 88.7 88.3 87.4  PLT 272 278 259 259 299   Cardiac Enzymes: No results for input(s): "CKTOTAL", "CKMB", "CKMBINDEX", "TROPONINI" in the last 168 hours. BNP: Invalid input(s): "POCBNP" CBG: No results for input(s): "GLUCAP" in the last 168 hours. D-Dimer No results for input(s): "DDIMER" in the last 72 hours. Hgb A1c No results for input(s): "HGBA1C" in the last 72 hours. Lipid Profile No results for input(s): "CHOL", "HDL", "LDLCALC", "TRIG", "CHOLHDL", "LDLDIRECT" in the last 72 hours. Thyroid function studies Recent Labs    12/12/22 0125  TSH 2.611   Anemia work up No results for input(s): "VITAMINB12", "FOLATE", "FERRITIN", "TIBC", "IRON", "RETICCTPCT" in the last 72 hours. Urinalysis No results found for: "COLORURINE", "APPEARANCEUR", "LABSPEC", "PHURINE", "GLUCOSEU", "HGBUR", "BILIRUBINUR", "KETONESUR", "PROTEINUR", "UROBILINOGEN", "NITRITE", "LEUKOCYTESUR" Sepsis Labs Recent Labs  Lab 12/11/22 0204 12/12/22 0128 12/13/22 0113 12/14/22 0109  WBC 11.5* 12.1* 9.8 8.5   Microbiology Recent Results (from the past 240 hour(s))  Resp panel by RT-PCR (RSV, Flu A&B, Covid) Anterior Nasal Swab     Status: None   Collection Time: 12/10/22  7:55 PM   Specimen: Anterior Nasal Swab  Result Value Ref Range Status   SARS Coronavirus 2 by RT PCR NEGATIVE NEGATIVE Final    Comment: (NOTE) SARS-CoV-2 target nucleic acids are NOT  DETECTED.  The SARS-CoV-2 RNA is generally detectable in upper respiratory specimens during the acute phase of infection. The lowest concentration of SARS-CoV-2 viral copies this assay can detect is 138 copies/mL. A negative result does not preclude SARS-Cov-2 infection and should not be used as the sole basis for treatment or other patient management decisions. A negative result may occur with  improper specimen collection/handling, submission of specimen other than nasopharyngeal swab, presence of viral mutation(s) within the areas targeted by this assay, and inadequate number of viral copies(<138 copies/mL). A negative result must be combined with clinical observations, patient history, and epidemiological information. The expected result is Negative.  Fact Sheet for Patients:  BloggerCourse.com  Fact Sheet for Healthcare Providers:  SeriousBroker.it  This test is no t yet approved or cleared by the [place] FDA and  has been authorized for detection and/or diagnosis of SARS-CoV-2 by FDA under an Emergency Use Authorization (EUA). This EUA will remain  in effect (meaning this test can be used) for the duration of the COVID-19 declaration under Section 564(b)(1) of the Act, 21 U.S.C.section 360bbb-3(b)(1), unless the authorization is terminated  or revoked sooner.       Influenza A by PCR NEGATIVE NEGATIVE Final   Influenza B by PCR NEGATIVE NEGATIVE Final    Comment: (NOTE) The Xpert Xpress SARS-CoV-2/FLU/RSV plus assay is intended as an aid in the diagnosis of influenza from Nasopharyngeal swab specimens and should not be used as a sole basis for treatment. Nasal washings and aspirates are unacceptable for Xpert Xpress SARS-CoV-2/FLU/RSV testing.  Fact Sheet for Patients: BloggerCourse.com  Fact Sheet for Healthcare Providers:  SeriousBroker.it  This test is not yet  approved or cleared by the [place] and has been authorized for detection and/or diagnosis of SARS-CoV-2 by FDA under an Emergency Use Authorization (EUA). This EUA will remain in effect (meaning this test can be used) for the duration of the COVID-19 declaration under Section 564(b)(1) of the Act, 21 U.S.C. section 360bbb-3(b)(1), unless the authorization is terminated or revoked.     Resp Syncytial Virus by PCR NEGATIVE NEGATIVE Final    Comment: (NOTE) Fact Sheet for Patients: BloggerCourse.com  Fact Sheet for Healthcare Providers: SeriousBroker.it  This test is not yet approved or cleared by the [place] FDA and has been authorized for detection and/or diagnosis of SARS-CoV-2 by FDA under an Emergency Use Authorization (EUA). This EUA will remain in effect (meaning this test can be used) for the duration of the COVID-19 declaration under Section 564(b)(1) of the Act, 21 U.S.C. section 360bbb-3(b)(1), unless the authorization is terminated or revoked.  Performed at [facility], [address], [place], [place] [address]   Respiratory (~20 pathogens) panel by PCR     Status: None   Collection Time: 12/11/22  8:36 AM   Specimen: Nasopharyngeal Swab; Respiratory  Result Value Ref Range Status   Adenovirus NOT DETECTED NOT DETECTED Final   Coronavirus 229E NOT DETECTED NOT DETECTED Final    Comment: (NOTE) The Coronavirus on the Respiratory Panel, DOES NOT test for the novel  Coronavirus (2019 nCoV)    Coronavirus HKU1 NOT DETECTED NOT DETECTED Final   Coronavirus NL63 NOT DETECTED NOT DETECTED Final   Coronavirus OC43 NOT DETECTED NOT DETECTED Final   Metapneumovirus NOT DETECTED NOT DETECTED Final   Rhinovirus / Enterovirus NOT DETECTED NOT DETECTED Final   Influenza A NOT DETECTED NOT DETECTED Final   Influenza B NOT DETECTED NOT DETECTED Final   Parainfluenza Virus 1 NOT DETECTED NOT  DETECTED Final   Parainfluenza Virus 2 NOT DETECTED NOT DETECTED Final   Parainfluenza Virus 3 NOT DETECTED NOT DETECTED Final   Parainfluenza Virus 4 NOT DETECTED NOT DETECTED Final   Respiratory Syncytial Virus NOT DETECTED NOT DETECTED Final   Bordetella pertussis NOT DETECTED NOT DETECTED Final   Bordetella Parapertussis NOT DETECTED NOT DETECTED Final   Chlamydophila pneumoniae NOT DETECTED NOT DETECTED Final   Mycoplasma pneumoniae NOT DETECTED NOT DETECTED Final    Comment: Performed at [facility], [address] [place], [place] [address]    Procedures/Studies: DG Chest Port 1 View  Result Date: 12/10/2022 CLINICAL DATA:  141880 SOB (shortness of breath) [id number] EXAM: PORTABLE CHEST 1 VIEW COMPARISON:  Radiograph 08/03/2018 FINDINGS: Unchanged cardiomediastinal silhouette. There are diffuse interstitial opacities and a confluent density in the left peripheral lung base. No large effusion. No evidence of pneumothorax. Mild thoracic spondylosis. No acute osseous abnormality. IMPRESSION: Mild diffuse interstitial opacities suggesting interstitial edema. Focal confluent opacity in the left peripheral lung base, could reflect atelectasis or infection. Electronically Signed   By: Caprice Renshaw M.D.   On: 12/10/2022 18:42     Time coordinating discharge: Over 30 minutes    Lewie Chamber, MD  [facility] 12/14/2022, 1:22 PM

## 2022-12-14 NOTE — Plan of Care (Signed)
  Problem: Education: Goal: Knowledge of General Education information will improve Description Including pain rating scale, medication(s)/side effects and non-pharmacologic comfort measures Outcome: Adequate for Discharge   Problem: Health Behavior/Discharge Planning: Goal: Ability to manage health-related needs will improve Outcome: Adequate for Discharge   

## 2022-12-14 NOTE — Progress Notes (Signed)
SATURATION QUALIFICATIONS: (This note is used to comply with regulatory documentation for home oxygen)  Patient Saturations on Room Air at Rest = 96-99%  Patient Saturations on Room Air while Ambulating = 88-95%   Patient endorsed minor cough and minor SOB after 100 feet. No other distress noted. Patient shared that she is excited to be discharge today.

## 2022-12-16 LAB — ACTH: C206 ACTH: 42.3 pg/mL (ref 7.2–63.3)

## 2022-12-31 ENCOUNTER — Encounter: Payer: BC Managed Care – PPO | Admitting: Physical Therapy

## 2023-01-21 ENCOUNTER — Encounter: Payer: BC Managed Care – PPO | Admitting: Physical Therapy

## 2023-02-18 ENCOUNTER — Ambulatory Visit: Payer: BC Managed Care – PPO | Admitting: Pulmonary Disease

## 2023-02-18 ENCOUNTER — Encounter: Payer: Self-pay | Admitting: Pulmonary Disease

## 2023-02-18 VITALS — BP 116/64 | HR 82 | Ht 62.0 in | Wt 200.0 lb

## 2023-02-18 DIAGNOSIS — J189 Pneumonia, unspecified organism: Secondary | ICD-10-CM

## 2023-02-18 DIAGNOSIS — R0609 Other forms of dyspnea: Secondary | ICD-10-CM

## 2023-02-18 MED ORDER — BREZTRI AEROSPHERE 160-9-4.8 MCG/ACT IN AERO: 2.0000 | INHALATION_SPRAY | Freq: Two times a day (BID) | RESPIRATORY_TRACT | 11 refills | Status: DC

## 2023-02-18 MED ORDER — PREDNISONE 20 MG PO TABS: ORAL_TABLET | ORAL | 0 refills | Status: AC

## 2023-02-18 MED ORDER — BREZTRI AEROSPHERE 160-9-4.8 MCG/ACT IN AERO: 2.0000 | INHALATION_SPRAY | Freq: Two times a day (BID) | RESPIRATORY_TRACT | 0 refills | Status: DC

## 2023-02-18 NOTE — Progress Notes (Signed)
@Patient  ID: Tammy Cowan, female    DOB: [birth year], 67 y.o.   MRN: [id number]  Chief Complaint  Patient presents with  . Consult    Was in the hospital back in May 2024 for PNA. States she has been dealing with extreme SOB and wheezing ever since.     Referring provider: Ernest Mallick, PA*  HPI:   67 y.o. woman whom we are seeing in hospital follow-up of pneumonia and for evaluation of dyspnea on exertion.  Note from referring provider reviewed.  Discharge summary 12/2022 reviewed.  H&P 12/2022 reviewed.  Patient accidentally breathing issues prior to hospitalization for pneumonia 12/2022.  She presented meeting SIRS criteria.  Chest x-ray reviewed and interpreted as mild interstitial thickening diffusely as well as hazy left lower lobe infiltrate.  She started on antibiotics and given steroids given history of concern of COPD.  She has had no PFTs to diagnose this.  She suddenly improved rather rapidly.  Discharged with a course of prednisone and oral antibiotics.  Felt better for a couple of weeks.  Then became more short of breath.  Consistently wheezy.  Seen by PCP few times for this.  Her PCP says she is always wheezing.  Has been prescribed DuoNebs and albuterol.  Continuously wheeze.  Prescribed Breo but this caused hoarse voice and sore throat so she stopped taking.  She had repeat imaging 02/02/2023 CT scan the report I can view, no images, reportedly no infiltrate with background emphysematous changes.  She is a former smoker, recently quit.  She was congratulated on this.  Approximately 100-pack-year, up to 2 packs a day for 50 years.    Questionaires / Pulmonary Flowsheets:   ACT:      No data to display          MMRC:     No data to display          Epworth:      No data to display          Tests:   FENO:  No results found for: "NITRICOXIDE"  PFT:     No data to display          WALK:      No data to display           Imaging: Personally reviewed and as per EMR and discussion in this note No results found.  Lab Results: Personally reviewed CBC    Component Value Date/Time   WBC 8.5 12/14/2022 0109   RBC 5.70 (H) 12/14/2022 0109   HGB 16.8 (H) 12/14/2022 0109   HCT 49.8 (H) 12/14/2022 0109   PLT 299 12/14/2022 0109   MCV 87.4 12/14/2022 0109   MCH 29.5 12/14/2022 0109   MCHC 33.7 12/14/2022 0109   RDW 12.7 12/14/2022 0109   LYMPHSABS 0.9 12/14/2022 0109   MONOABS 0.1 12/14/2022 0109   EOSABS 0.0 12/14/2022 0109   BASOSABS 0.0 12/14/2022 0109    BMET    Component Value Date/Time   NA 132 (L) 12/14/2022 0109   K 4.2 12/14/2022 0109   CL 97 (L) 12/14/2022 0109   CO2 23 12/14/2022 0109   GLUCOSE 157 (H) 12/14/2022 0109   BUN 10 12/14/2022 0109   CREATININE 0.68 12/14/2022 0109   CALCIUM 9.4 12/14/2022 0109   GFRNONAA >60 12/14/2022 0109    BNP    Component Value Date/Time   BNP 42.6 12/10/2022 1801    ProBNP No results found for: "PROBNP"  Specialty Problems  Pulmonary Problems   CAP (community acquired pneumonia)   Chronic obstructive pulmonary disease (COPD) (HCC)    Allergies  Allergen Reactions  . Crestor [Rosuvastatin Calcium] Diarrhea    Pt states diarrhea for months after taking crestor  . Venlafaxine Other (See Comments)    Felt bad on it  . Vibegron     Immunization History  Administered Date(s) Administered  . Moderna Sars-Covid-2 Vaccination 09/27/2019, 10/25/2019  . PFIZER Comirnaty(Gray Top)Covid-19 Tri-Sucrose Vaccine 12/20/2020  . PFIZER(Purple Top)SARS-COV-2 Vaccination 06/13/2020    Past Medical History:  Diagnosis Date  . Depression   . Hypertension     Tobacco History: Social History   Tobacco Use  Smoking Status Former  . Types: Cigarettes  . Start date: 01/19/2023  Smokeless Tobacco Never   Counseling given: Not Answered   Continue to not smoke  Outpatient Encounter Medications as of 02/18/2023  Medication Sig  .  albuterol (VENTOLIN HFA) 108 (90 Base) MCG/ACT inhaler Inhale 2 puffs into the lungs every 4 (four) hours as needed for wheezing or shortness of breath.  Marland Kitchen azelastine (ASTELIN) 0.1 % nasal spray Place 1 spray into both nostrils 2 (two) times daily. Use in each nostril as directed  . Biotin 1 MG CAPS Take by mouth.  . Budeson-Glycopyrrol-Formoterol (BREZTRI AEROSPHERE) 160-9-4.8 MCG/ACT AERO Inhale 2 puffs into the lungs in the morning and at bedtime.  . busPIRone (BUSPAR) 10 MG tablet Take 10 mg by mouth 3 (three) times daily.  . calcium carbonate (OS-CAL - DOSED IN MG OF ELEMENTAL CALCIUM) 1250 (500 Ca) MG tablet Take 1 tablet by mouth.  . cholecalciferol (VITAMIN D3) 25 MCG (1000 UNIT) tablet Take 1,000 Units by mouth daily.  Marland Kitchen ipratropium-albuterol (DUONEB) 0.5-2.5 (3) MG/3ML SOLN Take 3 mLs by nebulization every 6 (six) hours as needed.  Marland Kitchen levocetirizine (XYZAL) 5 MG tablet Take 5 mg by mouth every evening.  Marland Kitchen MYRBETRIQ 50 MG TB24 tablet Take 50 mg by mouth daily.  . nicotine (NICODERM CQ - DOSED IN MG/24 HOURS) 14 mg/24hr patch Place 1 patch (14 mg total) onto the skin daily.  . predniSONE (DELTASONE) 20 MG tablet Take 2 tablets (40 mg total) by mouth daily with breakfast for 5 days, THEN 1 tablet (20 mg total) daily with breakfast for 5 days.  Marland Kitchen sertraline (ZOLOFT) 100 MG tablet Take 100 mg by mouth daily.  . traZODone (DESYREL) 50 MG tablet Take 50 mg by mouth at bedtime.  . valsartan-hydrochlorothiazide (DIOVAN-HCT) 160-12.5 MG tablet Take 1 tablet by mouth daily.  . vitamin B-12 (CYANOCOBALAMIN) 100 MCG tablet Take 100 mcg by mouth daily.  . [DISCONTINUED] atorvastatin (LIPITOR) 10 MG tablet Take 10 mg by mouth daily. (Patient not taking: Reported on 12/11/2022)  . [DISCONTINUED] fluticasone furoate-vilanterol (BREO ELLIPTA) 200-25 MCG/ACT AEPB Inhale 1 puff into the lungs daily. (Patient not taking: Reported on 12/11/2022)  . [DISCONTINUED] Vibegron 75 MG TABS Take 75 mg by mouth daily.    No facility-administered encounter medications on file as of 02/18/2023.     Review of Systems  Review of Systems  No chest pain with exertion.  No orthopnea or PND.  Comprehensive review of systems otherwise negative. Physical Exam  BP 116/64   Pulse 82   Ht 5\' 2"  (1.575 m)   Wt 200 lb (90.7 kg)   SpO2 97% Comment: on RA  BMI 36.58 kg/m   Wt Readings from Last 5 Encounters:  02/18/23 200 lb (90.7 kg)  12/10/22 204 lb 9.6 oz (92.8 kg)  07/19/20 192 lb 10.9 oz (87.4 kg)  05/10/20 190 lb 0.6 oz (86.2 kg)  08/03/19 185 lb 11.2 oz (84.2 kg)    BMI Readings from Last 5 Encounters:  02/18/23 36.58 kg/m  12/10/22 37.42 kg/m  07/19/20 34.13 kg/m  05/10/20 34.20 kg/m  08/03/19 33.96 kg/m     Physical Exam General: Sitting in chair, in no acute distress Eyes: EOMI, no icterus Neck: Supple, no JVP appreciated sitting upright Pulmonary: Mild end expiratory wheeze right greater than left, normal work of breathing, noisy breathing Cardiovascular: Warm, no edema Abdomen: Not think about sounds present MSK: No synovitis, no joint effusion Neuro: Normal gait, no weakness Psych: Normal mood, full affect   Assessment & Plan:   Community-acquired pneumonia: Left lower lobe infiltrate with diffuse interstitial markings concerning for element of volume overload versus atypical infection.  She reports recent chest x-ray was clear.  I see a CT scan 02/2023 report that mentions no pneumonia, does demonstrate emphysematous changes.  Resolution of pneumonia on subsequent imaging is very reassuring.  Dyspnea on exertion/wheeze: Seems worse since pneumonia in May 2024.  Former smoker.  Smoking-related disease (emphysema on imaging) versus asthma.  PFTs for further evaluation.  Prednisone taper.  New prescription (and co-pay card) for Breztri today (emphysema on CT scan and admitted with exacerbation of likely underlying smoking-related lung disease) with samples also provided to see if  we can provide some therapeutic relief.  She has failed DPI due to hoarse voice and throat soreness.   Return in about 3 months (around 05/21/2023) for f/u Dr. Judeth Horn, after PFT.   Karren Burly, MD 02/18/2023   This appointment required 61 minutes of patient care (this includes precharting, chart review, review of results, face-to-face care, etc.).

## 2023-02-18 NOTE — Patient Instructions (Addendum)
Nice to meet you  Use Breztri 2 puffs in the morning and 2 puffs in the evening.  Rinse your mouth out with water after every use.  I provided samples, each sample inhaler last 7 days.  In addition I provided a co-pay card for [facility] hopefully this will help with any cost issues.  Please send me a message if too expensive.  Continue use the albuterol in the nebulizer treatments as needed for shortness of breath, cough, wheeze.  Take prednisone as prescribed, 40 mg for 5 days then 20 mg for 5 days then stop.  I have ordered pulmonary function test for further evaluation of your symptoms to help [place] arrive the diagnosis.  Please schedule BX next available.  I reviewed the results of the CT scan, good news that the pneumonia has gone away.  Return to clinic in 3 months after pulmonary function test

## 2023-02-23 ENCOUNTER — Encounter: Payer: BC Managed Care – PPO | Admitting: Physical Therapy

## 2023-05-11 ENCOUNTER — Ambulatory Visit: Payer: BC Managed Care – PPO | Admitting: Pulmonary Disease

## 2023-05-11 DIAGNOSIS — R0609 Other forms of dyspnea: Secondary | ICD-10-CM | POA: Diagnosis not present

## 2023-05-11 LAB — PULMONARY FUNCTION TEST
DL/VA % pred: 59 %
DL/VA: 2.53 ml/min/mmHg/L
DLCO cor % pred: 54 %
DLCO cor: 10.11 ml/min/mmHg
DLCO unc % pred: 54 %
DLCO unc: 10.11 ml/min/mmHg
FEF 25-75 Post: 0.63 L/s
FEF 25-75 Pre: 0.51 L/s
FEF2575-%Change-Post: 22 %
FEF2575-%Pred-Post: 30 %
FEF2575-%Pred-Pre: 24 %
FEV1-%Change-Post: 6 %
FEV1-%Pred-Post: 54 %
FEV1-%Pred-Pre: 51 %
FEV1-Post: 1.24 L
FEV1-Pre: 1.16 L
FEV1FVC-%Change-Post: 0 %
FEV1FVC-%Pred-Pre: 69 %
FEV6-%Change-Post: 6 %
FEV6-%Pred-Post: 80 %
FEV6-%Pred-Pre: 75 %
FEV6-Post: 2.28 L
FEV6-Pre: 2.15 L
FEV6FVC-%Change-Post: 0 %
FEV6FVC-%Pred-Post: 102 %
FEV6FVC-%Pred-Pre: 103 %
FVC-%Change-Post: 7 %
FVC-%Pred-Post: 78 %
FVC-%Pred-Pre: 73 %
FVC-Post: 2.32 L
FVC-Pre: 2.17 L
Post FEV1/FVC ratio: 53 %
Post FEV6/FVC ratio: 98 %
Pre FEV1/FVC ratio: 54 %
Pre FEV6/FVC Ratio: 99 %
RV % pred: 144 %
RV: 2.85 L
TLC % pred: 108 %
TLC: 5.17 L

## 2023-05-11 NOTE — Patient Instructions (Signed)
Full PFT performed today. °

## 2023-05-11 NOTE — Progress Notes (Signed)
Full PFT performed today. °

## 2023-05-14 ENCOUNTER — Encounter: Payer: Self-pay | Admitting: Pulmonary Disease

## 2023-05-14 ENCOUNTER — Ambulatory Visit: Payer: BC Managed Care – PPO | Admitting: Pulmonary Disease

## 2023-05-14 VITALS — BP 122/76 | HR 88 | Temp 97.9°F | Ht 63.0 in | Wt 211.0 lb

## 2023-05-14 DIAGNOSIS — J439 Emphysema, unspecified: Secondary | ICD-10-CM

## 2023-05-14 DIAGNOSIS — R0981 Nasal congestion: Secondary | ICD-10-CM | POA: Diagnosis not present

## 2023-05-14 MED ORDER — FLUTICASONE PROPIONATE 50 MCG/ACT NA SUSP: 1.0000 | Freq: Two times a day (BID) | NASAL | 2 refills | Status: AC

## 2023-05-14 MED ORDER — PREDNISONE 20 MG PO TABS: 40.0000 mg | ORAL_TABLET | Freq: Every day | ORAL | 0 refills | Status: AC

## 2023-05-14 MED ORDER — ALBUTEROL SULFATE HFA 108 (90 BASE) MCG/ACT IN AERS: 2.0000 | INHALATION_SPRAY | RESPIRATORY_TRACT | 4 refills | Status: AC | PRN

## 2023-05-14 NOTE — Patient Instructions (Signed)
Nice to see you  Continue Breztri 2 puffs twice a day every day  Take prednisone 40 mg once a day for 5 days and stop once this is helpful congestion in your nose and sinuses but is well with your breathing  Continue azelastine 2 sprays nostril twice a day this is being nasal spray you are prescribed in the past  Use Flonase 1 sprays nostril twice a day  Get Afrin over-the-counter, 2 sprays twice a day for 3 days then stop  Continue Xyzal once daily  Return to clinic in 3 months or sooner as needed with Dr. Judeth Horn

## 2023-05-14 NOTE — Progress Notes (Signed)
@Patient  ID: Tammy Cowan, female    DOB: Jun 22, 1956, 67 y.o.   MRN: [id number]  Chief Complaint  Patient presents with   Follow-up    Review PFT.  Sinus congestion and pressure.  Dyspnea and trouble getting a deep breath persistent.    Referring provider: Elder Negus, NP  HPI:   67 y.o. woman whom we are seeing for evaluation of dyspnea on exertion.  Most recent PCP note reviewed.  Patient returns in follow-up.  Given concern for smoking-related lung disease and dyspnea after pneumonia she was started on Breztri.  This helped some.  She continues with significant congestion.  Occasional wheezing particular with exertion.  Congestion in her chest and shortness of breath but also significant congestion in her nose, a lot of runny nose clear drainage etc.  Feels stopped up all the time.  Denies any significant pressure or pain.  No green discharge or discolored discharge.  She got a steroid injection about 1 week ago at her PCP office.  Seem to help for a day or 2.  She was not prescribed oral prednisone or oral steroids at that time.  PFTs obtained in the interim.  These reveal moderate COPD, air trapping, moderate reduced DLCO.  Unfortunately, she is back to smoking a half a pack a day.  She has been abstinent from smoking for a couple months after her hospitalization.  HPI initial visit: Patient with  breathing issues prior to hospitalization for pneumonia 12/2022.  She presented meeting SIRS criteria.  Chest x-ray reviewed and interpreted as mild interstitial thickening diffusely as well as hazy left lower lobe infiltrate.  She started on antibiotics and given steroids given history of concern of COPD.  She has had no PFTs to diagnose this.  She suddenly improved rather rapidly.  Discharged with a course of prednisone and oral antibiotics.  Felt better for a couple of weeks.  Then became more short of breath.  Consistently wheezy.  Seen by PCP few times for this.  Her PCP says she is  always wheezing.  Has been prescribed DuoNebs and albuterol.  Continuously wheeze.  Prescribed Breo but this caused hoarse voice and sore throat so she stopped taking.  She had repeat imaging 02/02/2023 CT scan the report I can view, no images, reportedly no infiltrate with background emphysematous changes.  She is a former smoker, recently quit.  She was congratulated on this.  Approximately 100-pack-year, up to 2 packs a day for 50 years.    Questionaires / Pulmonary Flowsheets:   ACT:      No data to display          MMRC:     No data to display          Epworth:      No data to display          Tests:   FENO:  No results found for: "NITRICOXIDE"  PFT:    Latest Ref Rng & Units 05/11/2023   10:52 AM  PFT Results  FVC-Pre L 2.17   FVC-Predicted Pre % 73   FVC-Post L 2.32   FVC-Predicted Post % 78   Pre FEV1/FVC % % 54   Post FEV1/FCV % % 53   FEV1-Pre L 1.16   FEV1-Predicted Pre % 51   FEV1-Post L 1.24   DLCO uncorrected ml/min/mmHg 10.11   DLCO UNC% % 54   DLCO corrected ml/min/mmHg 10.11   DLCO COR %Predicted % 54   DLVA Predicted %  59   TLC L 5.17   TLC % Predicted % 108   RV % Predicted % 144     WALK:      No data to display          Imaging: Personally reviewed and as per EMR and discussion in this note No results found.  Lab Results: Personally reviewed CBC    Component Value Date/Time   WBC 8.5 12/14/2022 0109   RBC 5.70 (H) 12/14/2022 0109   HGB 16.8 (H) 12/14/2022 0109   HCT 49.8 (H) 12/14/2022 0109   PLT 299 12/14/2022 0109   MCV 87.4 12/14/2022 0109   MCH 29.5 12/14/2022 0109   MCHC 33.7 12/14/2022 0109   RDW 12.7 12/14/2022 0109   LYMPHSABS 0.9 12/14/2022 0109   MONOABS 0.1 12/14/2022 0109   EOSABS 0.0 12/14/2022 0109   BASOSABS 0.0 12/14/2022 0109    BMET    Component Value Date/Time   NA 132 (L) 12/14/2022 0109   K 4.2 12/14/2022 0109   CL 97 (L) 12/14/2022 0109   CO2 23 12/14/2022 0109   GLUCOSE 157  (H) 12/14/2022 0109   BUN 10 12/14/2022 0109   CREATININE 0.68 12/14/2022 0109   CALCIUM 9.4 12/14/2022 0109   GFRNONAA >60 12/14/2022 0109    BNP    Component Value Date/Time   BNP 42.6 12/10/2022 1801    ProBNP No results found for: "PROBNP"  Specialty Problems       Pulmonary Problems   CAP (community acquired pneumonia)   Chronic obstructive pulmonary disease (COPD) (HCC)    Allergies  Allergen Reactions   Crestor [Rosuvastatin Calcium] Diarrhea    Pt states diarrhea for months after taking crestor   Venlafaxine Other (See Comments)    Felt bad on it   Vibegron     Immunization History  Administered Date(s) Administered   Moderna Sars-Covid-2 Vaccination 09/27/2019, 10/25/2019   PFIZER Comirnaty(Gray Top)Covid-19 Tri-Sucrose Vaccine 12/20/2020   PFIZER(Purple Top)SARS-COV-2 Vaccination 06/13/2020    Past Medical History:  Diagnosis Date   Depression    Hypertension     Tobacco History: Social History   Tobacco Use  Smoking Status Every Day   Current packs/day: 0.50   Average packs/day: 0.5 packs/day for 0.3 years (0.2 ttl pk-yrs)   Types: Cigarettes   Start date: 01/19/2023  Smokeless Tobacco Never   Ready to quit: Not Answered Counseling given: Not Answered   Continue to not smoke  Outpatient Encounter Medications as of 05/14/2023  Medication Sig   azelastine (ASTELIN) 0.1 % nasal spray Place 1 spray into both nostrils 2 (two) times daily. Use in each nostril as directed   Biotin 1 MG CAPS Take by mouth.   Budeson-Glycopyrrol-Formoterol (BREZTRI AEROSPHERE) 160-9-4.8 MCG/ACT AERO Inhale 2 puffs into the lungs in the morning and at bedtime.   busPIRone (BUSPAR) 10 MG tablet Take 10 mg by mouth 3 (three) times daily.   calcium carbonate (OS-CAL - DOSED IN MG OF ELEMENTAL CALCIUM) 1250 (500 Ca) MG tablet Take 1 tablet by mouth.   cholecalciferol (VITAMIN D3) 25 MCG (1000 UNIT) tablet Take 1,000 Units by mouth once a week.   fluticasone (FLONASE)  50 MCG/ACT nasal spray Place 1 spray into both nostrils in the morning and at bedtime.   ipratropium-albuterol (DUONEB) 0.5-2.5 (3) MG/3ML SOLN Take 3 mLs by nebulization every 6 (six) hours as needed.   levocetirizine (XYZAL) 5 MG tablet Take 5 mg by mouth every evening.   MYRBETRIQ 50 MG TB24 tablet  Take 50 mg by mouth daily.   nicotine (NICODERM CQ - DOSED IN MG/24 HOURS) 14 mg/24hr patch Place 1 patch (14 mg total) onto the skin daily.   predniSONE (DELTASONE) 20 MG tablet Take 2 tablets (40 mg total) by mouth daily with breakfast for 5 days.   sertraline (ZOLOFT) 100 MG tablet Take 100 mg by mouth daily.   traZODone (DESYREL) 50 MG tablet Take 50 mg by mouth at bedtime.   valsartan-hydrochlorothiazide (DIOVAN-HCT) 160-12.5 MG tablet Take 1 tablet by mouth daily.   vitamin B-12 (CYANOCOBALAMIN) 100 MCG tablet Take 100 mcg by mouth daily.   [DISCONTINUED] albuterol (VENTOLIN HFA) 108 (90 Base) MCG/ACT inhaler Inhale 2 puffs into the lungs every 4 (four) hours as needed for wheezing or shortness of breath.   [DISCONTINUED] Budeson-Glycopyrrol-Formoterol (BREZTRI AEROSPHERE) 160-9-4.8 MCG/ACT AERO Inhale 2 puffs into the lungs in the morning and at bedtime.   albuterol (VENTOLIN HFA) 108 (90 Base) MCG/ACT inhaler Inhale 2 puffs into the lungs every 4 (four) hours as needed for wheezing or shortness of breath.   No facility-administered encounter medications on file as of 05/14/2023.     Review of Systems  Review of Systems  N/a Physical Exam  BP 122/76 (BP Location: Left Arm, Patient Position: Sitting, Cuff Size: Large)   Pulse 88   Temp 97.9 F (36.6 C) (Oral)   Ht 5\' 3"  (1.6 m)   Wt 211 lb (95.7 kg)   SpO2 95%   BMI 37.38 kg/m   Wt Readings from Last 5 Encounters:  05/14/23 211 lb (95.7 kg)  02/18/23 200 lb (90.7 kg)  12/10/22 204 lb 9.6 oz (92.8 kg)  07/19/20 192 lb 10.9 oz (87.4 kg)  05/10/20 190 lb 0.6 oz (86.2 kg)    BMI Readings from Last 5 Encounters:  05/14/23  37.38 kg/m  02/18/23 36.58 kg/m  12/10/22 37.42 kg/m  07/19/20 34.13 kg/m  05/10/20 34.20 kg/m     Physical Exam General: Sitting in chair, in no acute distress Eyes: EOMI, no icterus Neck: Supple, no JVP appreciated sitting upright Pulmonary: Clear bilaterally, diminished air movement and diminished sounds throughout Cardiovascular: Warm, no edema Abdomen: Nondistended sounds present MSK: No synovitis, no joint effusion Neuro: Normal gait, no weakness Psych: Normal mood, full affect   Assessment & Plan:    Dyspnea on exertion/wheeze largely due to moderate COPD: Seems worse since pneumonia in May 2024.  Former smoker.  PFTs 05/2023 consider moderate COPD and air trapping.  Mild improvement symptoms with Breztri.  To continue.  Albuterol refilled.  Prednisone 40 mg daily for 5 days given persistent chest congestion.  Nasal/sinus congestion: Persistent for months.  On azelastine and Xyzal.  Encouraged to continue this, add Flonase 1 spray each nose twice daily, add Afrin 2 sprays each nostril twice daily for 3 days then stop.  Prednisone as above.  Return in about 3 months (around 08/14/2023) for f/u Dr. Judeth Horn.   Karren Burly, MD 05/14/2023

## 2023-06-28 ENCOUNTER — Encounter: Payer: Self-pay | Admitting: Pulmonary Disease

## 2023-06-30 MED ORDER — STIOLTO RESPIMAT 2.5-2.5 MCG/ACT IN AERS: 2.0000 | INHALATION_SPRAY | Freq: Every day | RESPIRATORY_TRACT | 11 refills | Status: DC

## 2023-06-30 NOTE — Telephone Encounter (Signed)
Stiolto sent to pharmacy.

## 2023-08-12 ENCOUNTER — Encounter: Payer: Self-pay | Admitting: Pulmonary Disease

## 2023-08-12 ENCOUNTER — Ambulatory Visit (INDEPENDENT_AMBULATORY_CARE_PROVIDER_SITE_OTHER): Payer: Self-pay | Admitting: Pulmonary Disease

## 2023-08-12 VITALS — BP 118/74 | HR 88 | Ht 62.0 in | Wt 213.6 lb

## 2023-08-12 DIAGNOSIS — J439 Emphysema, unspecified: Secondary | ICD-10-CM | POA: Diagnosis not present

## 2023-08-12 DIAGNOSIS — R0609 Other forms of dyspnea: Secondary | ICD-10-CM

## 2023-08-12 MED ORDER — IPRATROPIUM BROMIDE 0.02 % IN SOLN: 0.5000 mg | Freq: Four times a day (QID) | RESPIRATORY_TRACT | 12 refills | Status: DC | PRN

## 2023-08-12 NOTE — Patient Instructions (Signed)
 Nice to see you again  Continue Breztri   Try ipratropium nebulizer to replace the DuoNebs if not too expensive to see if it helps with the breathing but avoid some of the jitteriness, shakiness  Return to clinic in 6 months or sooner as needed with Dr. [name]

## 2023-08-12 NOTE — Progress Notes (Signed)
 @Patient  ID: Tammy Cowan, female    DOB: 03/04/1956, 68 y.o.   MRN: [id number]  Chief Complaint  Patient presents with   Follow-up    Pulmonary Emphysema F/U visit    Referring provider: Wallie Drafts, NP  HPI:   68 y.o. woman whom we are seeing for evaluation of dyspnea on exertion.  Most recent PCP note reviewed.  Most recent GYN note reviewed.  Patient returns in follow-up.  In the past breo made throat sore.  We tried Breztri .  Seem to help but describes some issues last visit.  Discussed some tongue discomfort.  We discussed stopping the Breztri .  Stiolto prescribed in the interim.  She  never used this.  She met with her PCP was diagnosed with thrush.  Placed on mouthwash.  She uses this with every dose of Breztri .  Has resumed Breztri .  Breathing is better with the Breztri .  HPI initial visit: Patient with  breathing issues prior to hospitalization for pneumonia 12/2022.  She presented meeting SIRS criteria.  Chest x-ray reviewed and interpreted as mild interstitial thickening diffusely as well as hazy left lower lobe infiltrate.  She started on antibiotics and given steroids given history of concern of COPD.  She has had no PFTs to diagnose this.  She suddenly improved rather rapidly.  Discharged with a course of prednisone  and oral antibiotics.  Felt better for a couple of weeks.  Then became more short of breath.  Consistently wheezy.  Seen by PCP few times for this.  Her PCP says she is always wheezing.  Has been prescribed DuoNebs and albuterol .  Continuously wheeze.  Prescribed Breo but this caused hoarse voice and sore throat so she stopped taking.  She had repeat imaging 02/02/2023 CT scan the report I can view, no images, reportedly no infiltrate with background emphysematous changes.  She is a former smoker, recently quit.  She was congratulated on this.  Approximately 100-pack-year, up to 2 packs a day for 50 years.    Questionaires / Pulmonary Flowsheets:   ACT:       No data to display          MMRC:     No data to display          Epworth:      No data to display          Tests:   FENO:  No results found for: NITRICOXIDE  PFT:    Latest Ref Rng & Units 05/11/2023   10:52 AM  PFT Results  FVC-Pre L 2.17   FVC-Predicted Pre % 73   FVC-Post L 2.32   FVC-Predicted Post % 78   Pre FEV1/FVC % % 54   Post FEV1/FCV % % 53   FEV1-Pre L 1.16   FEV1-Predicted Pre % 51   FEV1-Post L 1.24   DLCO uncorrected ml/min/mmHg 10.11   DLCO UNC% % 54   DLCO corrected ml/min/mmHg 10.11   DLCO COR %Predicted % 54   DLVA Predicted % 59   TLC L 5.17   TLC % Predicted % 108   RV % Predicted % 144     WALK:      No data to display          Imaging: Personally reviewed and as per EMR and discussion in this note No results found.  Lab Results: Personally reviewed CBC    Component Value Date/Time   WBC 8.5 12/14/2022 0109   RBC 5.70 (H) 12/14/2022 0109  HGB 16.8 (H) 12/14/2022 0109   HCT 49.8 (H) 12/14/2022 0109   PLT 299 12/14/2022 0109   MCV 87.4 12/14/2022 0109   MCH 29.5 12/14/2022 0109   MCHC 33.7 12/14/2022 0109   RDW 12.7 12/14/2022 0109   LYMPHSABS 0.9 12/14/2022 0109   MONOABS 0.1 12/14/2022 0109   EOSABS 0.0 12/14/2022 0109   BASOSABS 0.0 12/14/2022 0109    BMET    Component Value Date/Time   NA 132 (L) 12/14/2022 0109   K 4.2 12/14/2022 0109   CL 97 (L) 12/14/2022 0109   CO2 23 12/14/2022 0109   GLUCOSE 157 (H) 12/14/2022 0109   BUN 10 12/14/2022 0109   CREATININE 0.68 12/14/2022 0109   CALCIUM  9.4 12/14/2022 0109   GFRNONAA >60 12/14/2022 0109    BNP    Component Value Date/Time   BNP 42.6 12/10/2022 1801    ProBNP No results found for: PROBNP  Specialty Problems       Pulmonary Problems   CAP (community acquired pneumonia)   Chronic obstructive pulmonary disease (COPD) (HCC)    Allergies  Allergen Reactions   Crestor [Rosuvastatin Calcium ] Diarrhea    Pt states diarrhea  for months after taking crestor   Venlafaxine Other (See Comments)    Felt bad on it   Vibegron     Immunization History  Administered Date(s) Administered   Moderna Sars-Covid-2 Vaccination 09/27/2019, 10/25/2019   PFIZER Comirnaty(Gray Top)Covid-19 Tri-Sucrose Vaccine 12/20/2020   PFIZER(Purple Top)SARS-COV-2 Vaccination 06/13/2020    Past Medical History:  Diagnosis Date   Depression    Hypertension     Tobacco History: Social History   Tobacco Use  Smoking Status Every Day   Current packs/day: 0.50   Average packs/day: 0.5 packs/day for 0.6 years (0.3 ttl pk-yrs)   Types: Cigarettes   Start date: 01/19/2023  Smokeless Tobacco Never   Ready to quit: Not Answered Counseling given: Not Answered   Continue to not smoke  Outpatient Encounter Medications as of 08/12/2023  Medication Sig   ipratropium (ATROVENT ) 0.02 % nebulizer solution Take 2.5 mLs (0.5 mg total) by nebulization every 6 (six) hours as needed for wheezing or shortness of breath.   albuterol  (VENTOLIN  HFA) 108 (90 Base) MCG/ACT inhaler Inhale 2 puffs into the lungs every 4 (four) hours as needed for wheezing or shortness of breath.   azelastine  (ASTELIN ) 0.1 % nasal spray Place 1 spray into both nostrils 2 (two) times daily. Use in each nostril as directed   Biotin 1 MG CAPS Take by mouth.   busPIRone  (BUSPAR ) 10 MG tablet Take 10 mg by mouth 3 (three) times daily.   calcium  carbonate (OS-CAL - DOSED IN MG OF ELEMENTAL CALCIUM ) 1250 (500 Ca) MG tablet Take 1 tablet by mouth.   cholecalciferol  (VITAMIN D3) 25 MCG (1000 UNIT) tablet Take 1,000 Units by mouth once a week.   fluticasone  (FLONASE ) 50 MCG/ACT nasal spray Place 1 spray into both nostrils in the morning and at bedtime.   ipratropium-albuterol  (DUONEB) 0.5-2.5 (3) MG/3ML SOLN Take 3 mLs by nebulization every 6 (six) hours as needed.   levocetirizine (XYZAL ) 5 MG tablet Take 5 mg by mouth every evening.   MYRBETRIQ  50 MG TB24 tablet Take 50 mg by  mouth daily.   nicotine  (NICODERM CQ  - DOSED IN MG/24 HOURS) 14 mg/24hr patch Place 1 patch (14 mg total) onto the skin daily.   sertraline  (ZOLOFT ) 100 MG tablet Take 100 mg by mouth daily.   Tiotropium Bromide-Olodaterol (STIOLTO RESPIMAT ) 2.5-2.5  MCG/ACT AERS Inhale 2 puffs into the lungs daily.   traZODone  (DESYREL ) 50 MG tablet Take 50 mg by mouth at bedtime.   valsartan -hydrochlorothiazide  (DIOVAN -HCT) 160-12.5 MG tablet Take 1 tablet by mouth daily.   vitamin B-12 (CYANOCOBALAMIN ) 100 MCG tablet Take 100 mcg by mouth daily.   No facility-administered encounter medications on file as of 08/12/2023.     Review of Systems  Review of Systems  N/a Physical Exam  BP 118/74 (BP Location: Left Arm, Patient Position: Sitting, Cuff Size: Large)   Pulse 88   Ht 5' 2 (1.575 m)   Wt 213 lb 9.6 oz (96.9 kg)   SpO2 95%   BMI 39.07 kg/m   Wt Readings from Last 5 Encounters:  08/12/23 213 lb 9.6 oz (96.9 kg)  05/14/23 211 lb (95.7 kg)  02/18/23 200 lb (90.7 kg)  12/10/22 204 lb 9.6 oz (92.8 kg)  07/19/20 192 lb 10.9 oz (87.4 kg)    BMI Readings from Last 5 Encounters:  08/12/23 39.07 kg/m  05/14/23 37.38 kg/m  02/18/23 36.58 kg/m  12/10/22 37.42 kg/m  07/19/20 34.13 kg/m     Physical Exam General: Sitting in chair, in no acute distress Eyes: EOMI, no icterus Neck: Supple, no JVP appreciated sitting upright Pulmonary: Clear bilaterally, diminished air movement and diminished sounds throughout Cardiovascular: Warm, no edema Abdomen: Nondistended sounds present MSK: No synovitis, no joint effusion Neuro: Normal gait, no weakness Psych: Normal mood, full affect   Assessment & Plan:    Dyspnea on exertion/wheeze largely due to moderate COPD: Seems worse since pneumonia in May 2024.  Former smoker.  PFTs 05/2023 consistent with moderate COPD and air trapping.  Mild improvement symptoms with Breztri .  Unfortunate developed adverse reaction, tongue discomfort tingling so  stopped.  Has since resumed with mouthwash prevent thrush.  Continue Breztri .  DuoNebs causes jitteriness likely related to albuterol .  Okay to stop this for a period time, new prescription for ipratropium nebulizer.  If not as beneficial we will just have to deal with the shakiness and jitteriness with DuoNebs.  Nasal/sinus congestion: Persistent for months.  On azelastine  and Xyzal .  Encouraged to continue this, use Flonase .  Return in about 6 months (around 02/09/2024) for f/u Dr. [name].   Donnice JONELLE [name], MD 08/12/2023

## 2023-11-19 ENCOUNTER — Telehealth (HOSPITAL_COMMUNITY): Payer: Self-pay

## 2023-11-19 NOTE — Telephone Encounter (Signed)
 Pt is interested in the pulmonary rehab program. Will pass information to nurse navigator for review.

## 2023-11-19 NOTE — Telephone Encounter (Signed)
 Pt insurance is active and benefits verified through [employer] adv Co-pay $5, DED 0/0 met, out of pocket $3,400/$25.63 met, co-insurance 0%. [id number] pre-authorization required, Kim/HTA 11/19/23@9 :11am, REF# [id number]

## 2023-12-09 ENCOUNTER — Ambulatory Visit: Admitting: Nurse Practitioner

## 2023-12-09 ENCOUNTER — Encounter: Payer: Self-pay | Admitting: Nurse Practitioner

## 2023-12-09 ENCOUNTER — Ambulatory Visit (INDEPENDENT_AMBULATORY_CARE_PROVIDER_SITE_OTHER)

## 2023-12-09 VITALS — BP 134/82 | HR 88 | Ht 62.0 in | Wt 215.0 lb

## 2023-12-09 DIAGNOSIS — J441 Chronic obstructive pulmonary disease with (acute) exacerbation: Secondary | ICD-10-CM

## 2023-12-09 DIAGNOSIS — J439 Emphysema, unspecified: Secondary | ICD-10-CM

## 2023-12-09 DIAGNOSIS — J302 Other seasonal allergic rhinitis: Secondary | ICD-10-CM

## 2023-12-09 LAB — POCT EXHALED NITRIC OXIDE: FeNO level (ppb): 18

## 2023-12-09 MED ORDER — BREZTRI AEROSPHERE 160-9-4.8 MCG/ACT IN AERO: 2.0000 | INHALATION_SPRAY | Freq: Two times a day (BID) | RESPIRATORY_TRACT | 11 refills | Status: DC

## 2023-12-09 MED ORDER — ALBUTEROL SULFATE (2.5 MG/3ML) 0.083% IN NEBU
2.5000 mg | INHALATION_SOLUTION | Freq: Once | RESPIRATORY_TRACT | Status: AC
  Administered 2023-12-09: 2.5 mg via RESPIRATORY_TRACT

## 2023-12-09 MED ORDER — BREZTRI AEROSPHERE 160-9-4.8 MCG/ACT IN AERO: 2.0000 | INHALATION_SPRAY | Freq: Two times a day (BID) | RESPIRATORY_TRACT | Status: DC

## 2023-12-09 MED ORDER — PREDNISONE 10 MG PO TABS: ORAL_TABLET | ORAL | 0 refills | Status: DC

## 2023-12-09 NOTE — Progress Notes (Unsigned)
 @Patient  ID: Tammy Cowan, female    DOB: 10/29/1955, 69 y.o.   MRN: [id number]  Chief Complaint  Patient presents with   Follow-up    Patient is having sob. Neb is helping     Referring provider: [name], [name], NP  HPI: 68 year old female, active smoker followed for COPD with emphysema. She is a patient of Dr. Cari Char and last seen in office 08/12/2023. Past medical history significant for HTN, HLD, allergic rhinitis.   TEST/EVENTS:  05/11/2023 PFT: FVC 73, FEV1 51, ratio 53, TLC 108, DLCO 54. No BD. Severe obstruction without reversibility and severe diffusion defect   08/12/2023: OV with Dr. Marygrace Snellen. In the past, Breo made throat sore. Tried Breztri . Seemed to help but some issues with tongue discomfort. Used Stiolto in the interim. Never used this. She met with her PCP; diagnosed with thrush. Placed on mouthwash. Uses this with every dose of Breztri . Has resumed Breztri . Breathing is better.   12/09/2023: Today - acute  Tammy Cowan is a 68 year old female with moderate to severe COPD who presents with a flare-up of respiratory symptoms.  She has been experiencing exacerbations of her COPD symptoms since her last visit in January. She saw her PCP yesterday for flare-up, she was started on azithromycin , a steroid shot, and a five-day course of prednisone . She always improves with steroid courses but then gets worse as soon as she comes off. Currently, she has a productive cough with clear sputum, but no fever, chills, or hemoptysis. Still wheezing. She restarted Breztri  two weeks ago. She had been off inhalers prior to this. She sometimes uses nystatin mouthwash to manage thrush symptoms, with improvement. No issues with thrush today.   She has a history of smoking and is attempting to quit, having previously used Chantix successfully for two months. She's been off Chantix now but may considered resuming. She is concerned about weight gain, attributing it to prolonged prednisone   use, and reports gaining approximately forty pounds over the last year. She has a history of COVID-19 in 2022, RSV in 2023, and pneumonia requiring hospitalization last year.   She reports chronic sinus issues with clear nasal discharge and frequent headaches over the past two months, which she attributes to allergies. She uses Astelin  nasal spray, flonase , Zyrtec . No significant leg swelling, but occasional ankle puffiness is noted with steroid use.   Her current medications include Breztri , DuoNebs. She has not used a breathing treatment today due to work interruptions. She feels better in the mornings but experiences increased difficulty breathing as the day progresses.  FeNO 18 ppb   Allergies  Allergen Reactions   Crestor [Rosuvastatin Calcium ] Diarrhea    Pt states diarrhea for months after taking crestor   Venlafaxine Other (See Comments)    Felt bad on it   Vibegron     Immunization History  Administered Date(s) Administered   Moderna Sars-Covid-2 Vaccination 09/27/2019, 10/25/2019   PFIZER Comirnaty(Gray Top)Covid-19 Tri-Sucrose Vaccine 12/20/2020   PFIZER(Purple Top)SARS-COV-2 Vaccination 06/13/2020    Past Medical History:  Diagnosis Date   Depression    Hypertension     Tobacco History: Social History   Tobacco Use  Smoking Status Every Day   Current packs/day: 0.50   Average packs/day: 0.5 packs/day for 0.9 years (0.4 ttl pk-yrs)   Types: Cigarettes   Start date: 01/19/2023  Smokeless Tobacco Never  Tobacco Comments   10 cigarettes a day    Ready to quit: Not Answered Counseling given: Not Answered  Tobacco comments: 10 cigarettes a day    Outpatient Medications Prior to Visit  Medication Sig Dispense Refill   albuterol  (VENTOLIN  HFA) 108 (90 Base) MCG/ACT inhaler Inhale 2 puffs into the lungs every 4 (four) hours as needed for wheezing or shortness of breath. 3 each 4   azelastine  (ASTELIN ) 0.1 % nasal spray Place 1 spray into both nostrils 2 (two) times  daily. Use in each nostril as directed     Biotin 1 MG CAPS Take by mouth.     busPIRone  (BUSPAR ) 10 MG tablet Take 10 mg by mouth 3 (three) times daily.     calcium  carbonate (OS-CAL - DOSED IN MG OF ELEMENTAL CALCIUM ) 1250 (500 Ca) MG tablet Take 1 tablet by mouth.     cholecalciferol  (VITAMIN D3) 25 MCG (1000 UNIT) tablet Take 1,000 Units by mouth once a week.     fluticasone  (FLONASE ) 50 MCG/ACT nasal spray Place 1 spray into both nostrils in the morning and at bedtime. 16 g 2   ipratropium (ATROVENT ) 0.02 % nebulizer solution Take 2.5 mLs (0.5 mg total) by nebulization every 6 (six) hours as needed for wheezing or shortness of breath. 120 mL 12   ipratropium-albuterol  (DUONEB) 0.5-2.5 (3) MG/3ML SOLN Take 3 mLs by nebulization every 6 (six) hours as needed.     levocetirizine (XYZAL) 5 MG tablet Take 5 mg by mouth every evening.     nicotine  (NICODERM CQ  - DOSED IN MG/24 HOURS) 14 mg/24hr patch Place 1 patch (14 mg total) onto the skin daily. 28 patch 2   sertraline  (ZOLOFT ) 100 MG tablet Take 100 mg by mouth daily.     traZODone  (DESYREL ) 50 MG tablet Take 50 mg by mouth at bedtime.     valsartan -hydrochlorothiazide  (DIOVAN -HCT) 160-12.5 MG tablet Take 1 tablet by mouth daily.     vitamin B-12 (CYANOCOBALAMIN ) 100 MCG tablet Take 100 mcg by mouth daily.     Tiotropium Bromide-Olodaterol (STIOLTO RESPIMAT ) 2.5-2.5 MCG/ACT AERS Inhale 2 puffs into the lungs daily. 1 each 11   No facility-administered medications prior to visit.     Review of Systems:   Constitutional: No weight loss or gain, night sweats, fevers, chills, or lassitude. +fatigue  HEENT: No headaches, difficulty swallowing, tooth/dental problems, or sore throat. No sneezing, itching, ear ache +chronic nasal congestion, post nasal drip CV:  No chest pain, orthopnea, PND, swelling in lower extremities, anasarca, dizziness, palpitations, syncope Resp: +shortness of breath with exertion; productive cough; wheezing; chest  tightness. No hemoptysis. No chest wall deformity GI:  No heartburn, indigestion, abdominal pain, nausea, vomiting, diarrhea, change in bowel habits, loss of appetite, bloody stools.  GU: No dysuria, change in color of urine, urgency or frequency.  No flank pain, no hematuria  Skin: No rash, lesions, ulcerations MSK:  No joint pain or swelling.   Neuro: No dizziness or lightheadedness.  Psych: No depression or anxiety. Mood stable.     Physical Exam:  BP 134/82 (BP Location: Left Arm, Patient Position: Sitting)   Pulse 88   Ht 5\' 2"  (1.575 m)   Wt 215 lb (97.5 kg)   SpO2 93%   BMI 39.32 kg/m   GEN: Pleasant, interactive, well-kempt; obese; in no acute distress HEENT:  Normocephalic and atraumatic. PERRLA. Sclera white. Nasal turbinates boggy, moist and patent bilaterally. No rhinorrhea present. Oropharynx pink and moist, without exudate or edema. No lesions, ulcerations, or postnasal drip.  NECK:  Supple w/ fair ROM. No JVD present. Normal carotid impulses w/o bruits. Thyroid  symmetrical with no goiter or nodules palpated. No lymphadenopathy.   CV: RRR, no m/r/g, no peripheral edema. Pulses intact, +2 bilaterally. No cyanosis, pallor or clubbing. PULMONARY:  Unlabored, regular breathing. Scattered wheezing bilaterally A&P. No accessory muscle use.  GI: BS present and normoactive. Soft, non-tender to palpation.  MSK: No erythema, warmth or tenderness. Cap refil <2 sec all extrem.  Neuro: A/Ox3. No focal deficits noted.   Skin: Warm, no lesions or rashe Psych: Normal affect and behavior. Judgement and thought content appropriate.     Lab Results:  CBC    Component Value Date/Time   WBC 8.5 12/14/2022 0109   RBC 5.70 (H) 12/14/2022 0109   HGB 16.8 (H) 12/14/2022 0109   HCT 49.8 (H) 12/14/2022 0109   PLT 299 12/14/2022 0109   MCV 87.4 12/14/2022 0109   MCH 29.5 12/14/2022 0109   MCHC 33.7 12/14/2022 0109   RDW 12.7 12/14/2022 0109   LYMPHSABS 0.9 12/14/2022 0109   MONOABS  0.1 12/14/2022 0109   EOSABS 0.0 12/14/2022 0109   BASOSABS 0.0 12/14/2022 0109    BMET    Component Value Date/Time   NA 132 (L) 12/14/2022 0109   K 4.2 12/14/2022 0109   CL 97 (L) 12/14/2022 0109   CO2 23 12/14/2022 0109   GLUCOSE 157 (H) 12/14/2022 0109   BUN 10 12/14/2022 0109   CREATININE 0.68 12/14/2022 0109   CALCIUM  9.4 12/14/2022 0109   GFRNONAA >60 12/14/2022 0109    BNP    Component Value Date/Time   BNP 42.6 12/10/2022 1801     Imaging:  DG Chest 2 View Result Date: 12/09/2023 CLINICAL DATA:  Shortness of breath, cough. EXAM: CHEST - 2 VIEW COMPARISON:  Dec 10, 2022. FINDINGS: The heart size and mediastinal contours are within normal limits. Both lungs are clear. The visualized skeletal structures are unremarkable. IMPRESSION: No active cardiopulmonary disease. Electronically Signed   By: Rosalene Colon M.D.   On: 12/09/2023 15:51    albuterol  (PROVENTIL ) (2.5 MG/3ML) 0.083% nebulizer solution 2.5 mg     Date Action Dose Route User   12/09/2023 1502 Given 2.5 mg Nebulization Obike, Mercy, CMA          Latest Ref Rng & Units 05/11/2023   10:52 AM  PFT Results  FVC-Pre L 2.17   FVC-Predicted Pre % 73   FVC-Post L 2.32   FVC-Predicted Post % 78   Pre FEV1/FVC % % 54   Post FEV1/FCV % % 53   FEV1-Pre L 1.16   FEV1-Predicted Pre % 51   FEV1-Post L 1.24   DLCO uncorrected ml/min/mmHg 10.11   DLCO UNC% % 54   DLCO corrected ml/min/mmHg 10.11   DLCO COR %Predicted % 54   DLVA Predicted % 59   TLC L 5.17   TLC % Predicted % 108   RV % Predicted % 144     No results found for: "NITRICOXIDE"      Assessment & Plan:   COPD with acute exacerbation (HCC) Severe COPD with acute exacerbation. Concern for potential asthma overlap given allergy symptoms; however, no significant elevation in exhaled nitric oxide testing. She did receive depo injection yesterday and was started on steroids/abx. Still with significant bronchospasm on exam and decreased  airflow. Treated with albuterol  neb in office with improvement. Will increase her prednisone  and extend taper. Instructed to completed z pack. Peripheral eosinophils not significantly elevated; would not be a candidate for biologic therapy with Dupixent based on this. Could consider  Ohtuvayre  pending how she responds to triple therapy regimen. CXR today to rule out superimposed infection. Continue triple therapy regimen. Action plan in place.  Patient Instructions  Continue Albuterol  inhaler 2 puffs or 3 mL neb every 6 hours as needed for shortness of breath or wheezing. Notify if symptoms persist despite rescue inhaler/neb use.  Continue Breztri  2 puffs Twice daily. Brush tongue and rinse mouth afterwards. Use with spacer Continue nasal sprays as prescribed Continue current allergy pills  Prednisone  taper. 4 tabs for 3 days, then 3 tabs for 3 days, 2 tabs for 3 days, then 1 tab for 3 days, then stop. Take in AM with food.  Complete z pack as previously prescribed  Guaifenesin  (Mucinex ) over the counter 600 mg Twice daily for cough/congestion  Chest x ray today   Referral to allergy   Follow up in 4 weeks with Dr. Marygrace Snellen or Katie Truman Aceituno,NP. If symptoms do not improve or worsen, please contact office for sooner follow up or seek emergency care.    Allergic rhinitis Ongoing sinus symptoms despite nasal sprays and antihistamine. Referred to allergy for further evaluation/management.    Advised if symptoms do not improve or worsen, to please contact office for sooner follow up or seek emergency care.   I spent 35 minutes of dedicated to the care of this patient on the date of this encounter to include pre-visit review of records, face-to-face time with the patient discussing conditions above, post visit ordering of testing, clinical documentation with the electronic health record, making appropriate referrals as documented, and communicating necessary findings to members of the patients care  team.  Roetta Clarke, NP 12/10/2023  Pt aware and understands NP's role.

## 2023-12-09 NOTE — Patient Instructions (Addendum)
 Continue Albuterol  inhaler 2 puffs or 3 mL neb every 6 hours as needed for shortness of breath or wheezing. Notify if symptoms persist despite rescue inhaler/neb use.  Continue Breztri  2 puffs Twice daily. Brush tongue and rinse mouth afterwards. Use with spacer Continue nasal sprays as prescribed Continue current allergy pills  Prednisone  taper. 4 tabs for 3 days, then 3 tabs for 3 days, 2 tabs for 3 days, then 1 tab for 3 days, then stop. Take in AM with food.  Complete z pack as previously prescribed  Guaifenesin  (Mucinex ) over the counter 600 mg Twice daily for cough/congestion  Chest x ray today   Referral to allergy   Follow up in 4 weeks with Dr. Marygrace Snellen or Katie Truda Staub,NP. If symptoms do not improve or worsen, please contact office for sooner follow up or seek emergency care.

## 2023-12-10 ENCOUNTER — Encounter: Payer: Self-pay | Admitting: Nurse Practitioner

## 2023-12-10 ENCOUNTER — Encounter (HOSPITAL_BASED_OUTPATIENT_CLINIC_OR_DEPARTMENT_OTHER): Payer: Self-pay

## 2023-12-10 DIAGNOSIS — J309 Allergic rhinitis, unspecified: Secondary | ICD-10-CM | POA: Insufficient documentation

## 2023-12-10 NOTE — Assessment & Plan Note (Addendum)
 Severe COPD with acute exacerbation. Concern for potential asthma overlap given allergy symptoms; however, no significant elevation in exhaled nitric oxide testing. She did receive depo injection yesterday and was started on steroids/abx. Still with significant bronchospasm on exam and decreased airflow. Treated with albuterol  neb in office with improvement. Will increase her prednisone  and extend taper. Instructed to completed z pack. Peripheral eosinophils not significantly elevated; would not be a candidate for biologic therapy with Dupixent based on this. Could consider Ohtuvayre  pending how she responds to triple therapy regimen. CXR today to rule out superimposed infection. Continue triple therapy regimen. Action plan in place.  Patient Instructions  Continue Albuterol  inhaler 2 puffs or 3 mL neb every 6 hours as needed for shortness of breath or wheezing. Notify if symptoms persist despite rescue inhaler/neb use.  Continue Breztri  2 puffs Twice daily. Brush tongue and rinse mouth afterwards. Use with spacer Continue nasal sprays as prescribed Continue current allergy pills  Prednisone  taper. 4 tabs for 3 days, then 3 tabs for 3 days, 2 tabs for 3 days, then 1 tab for 3 days, then stop. Take in AM with food.  Complete z pack as previously prescribed  Guaifenesin  (Mucinex ) over the counter 600 mg Twice daily for cough/congestion  Chest x ray today   Referral to allergy   Follow up in 4 weeks with Dr. Marygrace Snellen or Katie Blanca Thornton,NP. If symptoms do not improve or worsen, please contact office for sooner follow up or seek emergency care.

## 2023-12-10 NOTE — Assessment & Plan Note (Signed)
 Ongoing sinus symptoms despite nasal sprays and antihistamine. Referred to allergy for further evaluation/management.

## 2024-01-01 ENCOUNTER — Telehealth (HOSPITAL_COMMUNITY): Payer: Self-pay

## 2024-01-01 NOTE — Telephone Encounter (Signed)
 Called patient to see if she was interested in participating in the Pulmonary Rehab Program. Patient will come in for orientation on 6/13 and will attend the 8:15 exercise class.  Sent MyChart message.

## 2024-01-11 ENCOUNTER — Ambulatory Visit (HOSPITAL_BASED_OUTPATIENT_CLINIC_OR_DEPARTMENT_OTHER): Admitting: Nurse Practitioner

## 2024-01-11 ENCOUNTER — Encounter (HOSPITAL_BASED_OUTPATIENT_CLINIC_OR_DEPARTMENT_OTHER): Payer: Self-pay | Admitting: Nurse Practitioner

## 2024-01-11 VITALS — BP 123/76 | HR 84 | Ht 62.0 in | Wt 215.0 lb

## 2024-01-11 DIAGNOSIS — Z720 Tobacco use: Secondary | ICD-10-CM

## 2024-01-11 DIAGNOSIS — J441 Chronic obstructive pulmonary disease with (acute) exacerbation: Secondary | ICD-10-CM

## 2024-01-11 DIAGNOSIS — J302 Other seasonal allergic rhinitis: Secondary | ICD-10-CM

## 2024-01-11 DIAGNOSIS — F17210 Nicotine dependence, cigarettes, uncomplicated: Secondary | ICD-10-CM

## 2024-01-11 DIAGNOSIS — B370 Candidal stomatitis: Secondary | ICD-10-CM | POA: Insufficient documentation

## 2024-01-11 MED ORDER — METHYLPREDNISOLONE ACETATE 80 MG/ML IJ SUSP
80.0000 mg | Freq: Once | INTRAMUSCULAR | Status: AC
Start: 2024-01-11 — End: 2024-01-11
  Administered 2024-01-11: 80 mg via INTRAMUSCULAR

## 2024-01-11 MED ORDER — NYSTATIN 100000 UNIT/ML MT SUSP: 5.0000 mL | Freq: Four times a day (QID) | OROMUCOSAL | 0 refills | Status: AC

## 2024-01-11 MED ORDER — PREDNISONE 10 MG PO TABS: ORAL_TABLET | ORAL | 0 refills | Status: DC

## 2024-01-11 NOTE — Progress Notes (Signed)
 @Patient  ID: Tammy Cowan, female    DOB: [birth year], 68 y.o.   MRN: [id number]  Chief Complaint  Patient presents with   Follow-up    Pulmonary emphysema    Referring provider: Jewell Mose, NP  HPI: 68 year old female, active smoker followed for COPD with emphysema. She is a patient of Dr. Cari Char and last seen in office 12/09/2023 by [facility] NP. Past medical history significant for HTN, HLD, allergic rhinitis.   TEST/EVENTS:  12/2022 eos 200  05/11/2023 PFT: FVC 73, FEV1 51, ratio 53, TLC 108, DLCO 54. No BD. Severe obstruction without reversibility and severe diffusion defect  12/09/2023 CXR: both lungs clear   08/12/2023: OV with Dr. Marygrace Snellen. In the past, Breo made throat sore. Tried Breztri . Seemed to help but some issues with tongue discomfort. Used Stiolto in the interim. Never used this. She met with her PCP; diagnosed with thrush. Placed on mouthwash. Uses this with every dose of Breztri . Has resumed Breztri . Breathing is better.   12/09/2023: Margarito Shearing with Jarrett Chicoine NP  Bobbette Eakes is a 68 year old female with moderate to severe COPD who presents with a flare-up of respiratory symptoms. She has been experiencing exacerbations of her COPD symptoms since her last visit in January. She saw her PCP yesterday for flare-up, she was started on azithromycin , a steroid shot, and a five-day course of prednisone . She always improves with steroid courses but then gets worse as soon as she comes off. Currently, she has a productive cough with clear sputum, but no fever, chills, or hemoptysis. Still wheezing. She restarted Breztri  two weeks ago. She had been off inhalers prior to this. She sometimes uses nystatin mouthwash to manage thrush symptoms, with improvement. No issues with thrush today.  She has a history of smoking and is attempting to quit, having previously used Chantix successfully for two months. She's been off Chantix now but may considered resuming. She is concerned about weight gain,  attributing it to prolonged prednisone  use, and reports gaining approximately forty pounds over the last year. She has a history of COVID-19 in 2022, RSV in 2023, and pneumonia requiring hospitalization last year.  She reports chronic sinus issues with clear nasal discharge and frequent headaches over the past two months, which she attributes to allergies. She uses Astelin  nasal spray, flonase , Zyrtec . No significant leg swelling, but occasional ankle puffiness is noted with steroid use.  Her current medications include Breztri , DuoNebs. She has not used a breathing treatment today due to work interruptions. She feels better in the mornings but experiences increased difficulty breathing as the day progresses. FeNO 18 ppb   01/11/2024: Today - follow up Discussed the use of AI scribe software for clinical note transcription with the patient, who gave verbal consent to proceed.  History of Present Illness   RAEANNA Cowan is a 68 year old female with COPD who presents with worsening respiratory symptoms.  Her respiratory symptoms have worsened over the last week. She was feeling better after her last visit and completing steroids. She experiences wheezing and is coughing up mostly clear sputum. Has more issues with her activity tolerance due to her breathing. No fever, chills, or hemoptysis.  She has a history of frequent steroid use this year, which she feels has contributed to weight gain. She is frustrated with the frequent need for steroids, noting they cause her to feel 'puffy'.  She mentions experiencing a burning sensation on her tongue and the roof of her mouth, which she  has been managing with baking soda. She uses a spacer with her Breztri  inhaler most of the time, taking two puffs in the morning and evening. She also uses her nebulizer treatment twice a day. Her rescue inhaler, either Ventolin  or albuterol , is used every morning and evening.  She is still smoking. High stress at home;  doesn't feel she can quit at this point.  She has a long history of smoking, having started at age 40 or 40. She smoked up to two and a half packs a day during stressful periods, such as when her husband first got sick with alcohol-induced dementia. Currently, she is trying to limit her smoking to half a pack a day, though she finds it challenging, especially on weekends when she is at home.       Allergies  Allergen Reactions   Crestor [Rosuvastatin Calcium ] Diarrhea    Pt states diarrhea for months after taking crestor   Venlafaxine Other (See Comments)    Felt bad on it   Vibegron     Immunization History  Administered Date(s) Administered   Moderna Sars-Covid-2 Vaccination 09/27/2019, 10/25/2019   PFIZER Comirnaty(Gray Top)Covid-19 Tri-Sucrose Vaccine 12/20/2020   PFIZER(Purple Top)SARS-COV-2 Vaccination 06/13/2020    Past Medical History:  Diagnosis Date   Depression    Hypertension     Tobacco History: Social History   Tobacco Use  Smoking Status Every Day   Current packs/day: 1.00   Average packs/day: 1 pack/day for 52.4 years (52.4 ttl pk-yrs)   Types: Cigarettes   Start date: 1973  Smokeless Tobacco Never  Tobacco Comments   10 cigarettes a day, most days    Ready to quit: Not Answered Counseling given: Not Answered Tobacco comments: 10 cigarettes a day, most days    Outpatient Medications Prior to Visit  Medication Sig Dispense Refill   albuterol  (VENTOLIN  HFA) 108 (90 Base) MCG/ACT inhaler Inhale 2 puffs into the lungs every 4 (four) hours as needed for wheezing or shortness of breath. 3 each 4   azelastine  (ASTELIN ) 0.1 % nasal spray Place 1 spray into both nostrils 2 (two) times daily. Use in each nostril as directed     Biotin 1 MG CAPS Take by mouth.     budeson-glycopyrrolate-formoterol (BREZTRI  AEROSPHERE) 160-9-4.8 MCG/ACT AERO inhaler Inhale 2 puffs into the lungs in the morning and at bedtime. 10.7 g 11   budeson-glycopyrrolate-formoterol  (BREZTRI  AEROSPHERE) 160-9-4.8 MCG/ACT AERO inhaler Inhale 2 puffs into the lungs in the morning and at bedtime.     busPIRone  (BUSPAR ) 10 MG tablet Take 10 mg by mouth 3 (three) times daily.     calcium  carbonate (OS-CAL - DOSED IN MG OF ELEMENTAL CALCIUM ) 1250 (500 Ca) MG tablet Take 1 tablet by mouth.     cholecalciferol  (VITAMIN D3) 25 MCG (1000 UNIT) tablet Take 1,000 Units by mouth once a week.     fluticasone  (FLONASE ) 50 MCG/ACT nasal spray Place 1 spray into both nostrils in the morning and at bedtime. 16 g 2   ipratropium (ATROVENT ) 0.02 % nebulizer solution Take 2.5 mLs (0.5 mg total) by nebulization every 6 (six) hours as needed for wheezing or shortness of breath. 120 mL 12   ipratropium-albuterol  (DUONEB) 0.5-2.5 (3) MG/3ML SOLN Take 3 mLs by nebulization every 6 (six) hours as needed.     levocetirizine (XYZAL) 5 MG tablet Take 5 mg by mouth every evening.     nicotine  (NICODERM CQ  - DOSED IN MG/24 HOURS) 14 mg/24hr patch Place 1 patch (14  mg total) onto the skin daily. 28 patch 2   sertraline  (ZOLOFT ) 100 MG tablet Take 100 mg by mouth daily.     traZODone  (DESYREL ) 50 MG tablet Take 50 mg by mouth at bedtime.     valsartan -hydrochlorothiazide  (DIOVAN -HCT) 160-12.5 MG tablet Take 1 tablet by mouth daily.     vitamin B-12 (CYANOCOBALAMIN ) 100 MCG tablet Take 100 mcg by mouth daily.     predniSONE  (DELTASONE ) 10 MG tablet 4 tabs for 3 days, then 3 tabs for 3 days, 2 tabs for 3 days, then 1 tab for 3 days, then stop 30 tablet 0   No facility-administered medications prior to visit.     Review of Systems:   Constitutional: No weight loss or gain, night sweats, fevers, chills, or lassitude. +fatigue  HEENT: No headaches, difficulty swallowing, tooth/dental problems, or sore throat. No sneezing, itching, ear ache +chronic nasal congestion, post nasal drip CV:  No chest pain, orthopnea, PND, swelling in lower extremities, anasarca, dizziness, palpitations, syncope Resp: +shortness of  breath with exertion; minimally productive cough (clear phlegm); wheezing; chest tightness. No hemoptysis. No chest wall deformity GI:  No heartburn, indigestion, abdominal pain, nausea, vomiting, diarrhea, change in bowel habits, loss of appetite, bloody stools.  GU: No dysuria, change in color of urine, urgency or frequency.  No flank pain, no hematuria  Skin: No rash, lesions, ulcerations MSK:  No joint pain or swelling.   Neuro: No dizziness or lightheadedness.  Psych: No depression or anxiety. Mood stable.     Physical Exam:  BP 123/76   Pulse 84   Ht 5\' 2"  (1.575 m)   Wt 215 lb (97.5 kg)   SpO2 93%   BMI 39.32 kg/m   GEN: Pleasant, interactive, well-kempt; obese; in no acute distress HEENT:  Normocephalic and atraumatic. PERRLA. Sclera white. Nasal turbinates boggy, moist and patent bilaterally. No rhinorrhea present. Oropharynx erythematous and moist, without exudate or edema. White plaques to palate NECK:  Supple w/ fair ROM. No JVD present. Normal carotid impulses w/o bruits. Thyroid symmetrical with no goiter or nodules palpated. No lymphadenopathy.   CV: RRR, no m/r/g, no peripheral edema. Pulses intact, +2 bilaterally. No cyanosis, pallor or clubbing. PULMONARY:  Unlabored, regular breathing. Scattered wheezing bilaterally A&P. No accessory muscle use.  GI: BS present and normoactive. Soft, non-tender to palpation.  MSK: No erythema, warmth or tenderness. Cap refil <2 sec all extrem.  Neuro: A/Ox3. No focal deficits noted.   Skin: Warm, no lesions or rashe Psych: Normal affect and behavior. Judgement and thought content appropriate.     Lab Results:  CBC    Component Value Date/Time   WBC 8.5 12/14/2022 0109   RBC 5.70 (H) 12/14/2022 0109   HGB 16.8 (H) 12/14/2022 0109   HCT 49.8 (H) 12/14/2022 0109   PLT 299 12/14/2022 0109   MCV 87.4 12/14/2022 0109   MCH 29.5 12/14/2022 0109   MCHC 33.7 12/14/2022 0109   RDW 12.7 12/14/2022 0109   LYMPHSABS 0.9 12/14/2022  0109   MONOABS 0.1 12/14/2022 0109   EOSABS 0.0 12/14/2022 0109   BASOSABS 0.0 12/14/2022 0109    BMET    Component Value Date/Time   NA 132 (L) 12/14/2022 0109   K 4.2 12/14/2022 0109   CL 97 (L) 12/14/2022 0109   CO2 23 12/14/2022 0109   GLUCOSE 157 (H) 12/14/2022 0109   BUN 10 12/14/2022 0109   CREATININE 0.68 12/14/2022 0109   CALCIUM  9.4 12/14/2022 0109   GFRNONAA >60 12/14/2022  0109    BNP    Component Value Date/Time   BNP 42.6 12/10/2022 1801     Imaging:  No results found.   albuterol  (PROVENTIL ) (2.5 MG/3ML) 0.083% nebulizer solution 2.5 mg     Date Action Dose Route User   12/09/2023 1502 Given 2.5 mg Nebulization Obike, Mercy, CMA      methylPREDNISolone  acetate (DEPO-MEDROL ) injection 80 mg     Date Action Dose Route User   01/11/2024 0903 Given 80 mg Intramuscular (Right Quadriceps) Kary Pages, CMA          Latest Ref Rng & Units 05/11/2023   10:52 AM  PFT Results  FVC-Pre L 2.17   FVC-Predicted Pre % 73   FVC-Post L 2.32   FVC-Predicted Post % 78   Pre FEV1/FVC % % 54   Post FEV1/FCV % % 53   FEV1-Pre L 1.16   FEV1-Predicted Pre % 51   FEV1-Post L 1.24   DLCO uncorrected ml/min/mmHg 10.11   DLCO UNC% % 54   DLCO corrected ml/min/mmHg 10.11   DLCO COR %Predicted % 54   DLVA Predicted % 59   TLC L 5.17   TLC % Predicted % 108   RV % Predicted % 144     No results found for: "NITRICOXIDE"      Assessment & Plan:   COPD with acute exacerbation (HCC) Recurrent AECOPD following discontinuation of steroids and possibly due to environmental triggers. Awaiting appt with allergy. Bronchospasm on exam. Will treat her with depo inj 80 mg x 1 in office and prednisone  taper. Hold off on antimicrobial therapy given lack of infectious symptoms. Prior CXR clear. 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 Side effect/safety profile  reviewed. Enrollment paperwork completed today. Goal to reduce exacerbations and frequent steroid use, as well as improve lung function and quality of life. She does need to quit smoking, which we reviewed. Will readdress at follow up. Continue current regimen. Action plan in place.   Patient Instructions  Continue Albuterol  inhaler 2 puffs or 3 mL neb every 6 hours as needed for shortness of breath or wheezing. Notify if symptoms persist despite rescue inhaler/neb use.  Continue Breztri  2 puffs Twice daily. Brush tongue and rinse mouth afterwards. Use with spacer Continue nasal sprays as prescribed Continue current allergy pills   Prednisone  taper. 4 tabs for 3 days, then 3 tabs for 3 days, 2 tabs for 3 days, then 1 tab for 3 days, then stop. Take in AM with food. Start tomorrow  Guaifenesin  (Mucinex ) over the counter 600 mg Twice daily for cough/congestion Nystatin 5 mL four times a day for 10 days. Swish and swallow  Start Nucala injections every 4 weeks for management of your COPD. Our pharmacy team will contact you to set this up  Referral to lung cancer screening program    Follow up in 2-3 weeks with Katie Reuel Lamadrid,NP and Dr. Marygrace Snellen in 3 months. If symptoms do not improve or worsen, please contact office for sooner follow up or seek emergency care.    Allergic rhinitis On aggressive maintenance regimen. Awaiting allergy evaluation  Tobacco use Active smoker with heavy smoking history. Referral to lung cancer screening program placed today. Smoking cessation strongly encouraged. Not ready to quit. Will readdress at follow up.     Thrush Thrush on exam. Nystatin rinses rx. Reviewed proper oral hygiene. Frequent prednisone  use likely contributing. Advised to use  spacer.     Advised if symptoms do not improve or worsen, to please contact office for sooner follow up or seek emergency care.   I spent 35 minutes of dedicated to the care of this patient on the date of this encounter to  include pre-visit review of records, face-to-face time with the patient discussing conditions above, post visit ordering of testing, clinical documentation with the electronic health record, making appropriate referrals as documented, and communicating necessary findings to members of the patients care team.  Roetta Clarke, NP 01/11/2024  Pt aware and understands NP's role.

## 2024-01-11 NOTE — Assessment & Plan Note (Signed)
 On aggressive maintenance regimen. Awaiting allergy evaluation

## 2024-01-11 NOTE — Assessment & Plan Note (Signed)
 Active smoker with heavy smoking history. Referral to lung cancer screening program placed today. Smoking cessation strongly encouraged. Not ready to quit. Will readdress at follow up.

## 2024-01-11 NOTE — Assessment & Plan Note (Addendum)
 Thrush on exam. Nystatin rinses rx. Reviewed proper oral hygiene. Frequent prednisone  use likely contributing. Advised to use spacer.

## 2024-01-11 NOTE — Assessment & Plan Note (Signed)
 Recurrent AECOPD following discontinuation of steroids and possibly due to environmental triggers. Awaiting appt with allergy. Bronchospasm on exam. Will treat her with depo inj 80 mg x 1 in office and prednisone  taper. Hold off on antimicrobial therapy given lack of infectious symptoms. Prior CXR clear. 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 Side effect/safety profile reviewed. Enrollment paperwork completed today. Goal to reduce exacerbations and frequent steroid use, as well as improve lung function and quality of life. She does need to quit smoking, which we reviewed. Will readdress at follow up. Continue current regimen. Action plan in place.   Patient Instructions  Continue Albuterol  inhaler 2 puffs or 3 mL neb every 6 hours as needed for shortness of breath or wheezing. Notify if symptoms persist despite rescue inhaler/neb use.  Continue Breztri  2 puffs Twice daily. Brush tongue and rinse mouth afterwards. Use with spacer Continue nasal sprays as prescribed Continue current allergy pills   Prednisone  taper. 4 tabs for 3 days, then 3 tabs for 3 days, 2 tabs for 3 days, then 1 tab for 3 days, then stop. Take in AM with food. Start tomorrow  Guaifenesin  (Mucinex ) over the counter 600 mg Twice daily for cough/congestion Nystatin 5 mL four times a day for 10 days. Swish and swallow  Start Nucala injections every 4 weeks for management of your COPD. Our pharmacy team will contact you to set this up  Referral to lung cancer screening program    Follow up in 2-3 weeks with Katie Michelene Keniston,NP and Dr. Marygrace Snellen in 3 months. If symptoms do not improve or worsen, please contact office for sooner follow up or seek emergency care.

## 2024-01-11 NOTE — Patient Instructions (Addendum)
 Continue Albuterol  inhaler 2 puffs or 3 mL neb every 6 hours as needed for shortness of breath or wheezing. Notify if symptoms persist despite rescue inhaler/neb use.  Continue Breztri  2 puffs Twice daily. Brush tongue and rinse mouth afterwards. Use with spacer Continue nasal sprays as prescribed Continue current allergy pills   Prednisone  taper. 4 tabs for 3 days, then 3 tabs for 3 days, 2 tabs for 3 days, then 1 tab for 3 days, then stop. Take in AM with food. Start tomorrow  Guaifenesin  (Mucinex ) over the counter 600 mg Twice daily for cough/congestion Nystatin 5 mL four times a day for 10 days. Swish and swallow  Start Nucala injections every 4 weeks for management of your COPD. Our pharmacy team will contact you to set this up  Referral to lung cancer screening program    Follow up in 2-3 weeks with Tammy Maniya Donovan,NP and Tammy Cowan in 3 months. If symptoms do not improve or worsen, please contact office for sooner follow up or seek emergency care.

## 2024-01-13 ENCOUNTER — Telehealth: Payer: Self-pay

## 2024-01-13 NOTE — Telephone Encounter (Signed)
 Received new start paperwork for Nucala via Onbase  Submitted a Prior Authorization request to [facility] [employer] for NUCALA via CoverMyMeds. Will update once we receive a response.  Key: [id number]

## 2024-01-14 ENCOUNTER — Telehealth (HOSPITAL_COMMUNITY): Payer: Self-pay

## 2024-01-14 NOTE — Telephone Encounter (Signed)
 Called to confirm appt. No answer. Left VM.

## 2024-01-15 ENCOUNTER — Encounter (HOSPITAL_COMMUNITY)
Admission: RE | Admit: 2024-01-15 | Discharge: 2024-01-15 | Disposition: A | Discharge status: Home / Self Care | Source: Ambulatory Visit | Attending: Pulmonary Disease | Admitting: Pulmonary Disease

## 2024-01-15 ENCOUNTER — Encounter (HOSPITAL_COMMUNITY): Payer: Self-pay

## 2024-01-15 DIAGNOSIS — J431 Panlobular emphysema: Secondary | ICD-10-CM | POA: Diagnosis present

## 2024-01-15 NOTE — Progress Notes (Signed)
 Pulmonary Individual Treatment Plan  Patient Details  Name: Tammy Cowan MRN: [id number] Date of Birth: 1956/06/25 Referring Provider:   Gattis Kass Pulmonary Rehab Walk Test from 01/15/2024 in [facility]  Referring Provider Beam    Initial Encounter Date:  Flowsheet Row Pulmonary Rehab Walk Test from 01/15/2024 in [facility]  Date 01/15/24    Visit Diagnosis: Panlobular emphysema (HCC)  Patient's Home Medications on Admission:   Current Outpatient Medications:    albuterol  (VENTOLIN  HFA) 108 (90 Base) MCG/ACT inhaler, Inhale 2 puffs into the lungs every 4 (four) hours as needed for wheezing or shortness of breath., Disp: 3 each, Rfl: 4   azelastine  (ASTELIN ) 0.1 % nasal spray, Place 1 spray into both nostrils 2 (two) times daily. Use in each nostril as directed, Disp: , Rfl:    budeson-glycopyrrolate-formoterol (BREZTRI  AEROSPHERE) 160-9-4.8 MCG/ACT AERO inhaler, Inhale 2 puffs into the lungs in the morning and at bedtime., Disp: 10.7 g, Rfl: 11   budeson-glycopyrrolate-formoterol (BREZTRI  AEROSPHERE) 160-9-4.8 MCG/ACT AERO inhaler, Inhale 2 puffs into the lungs in the morning and at bedtime., Disp: , Rfl:    busPIRone  (BUSPAR ) 10 MG tablet, Take 10 mg by mouth 3 (three) times daily., Disp: , Rfl:    cholecalciferol  (VITAMIN D3) 25 MCG (1000 UNIT) tablet, Take 1,000 Units by mouth once a week., Disp: , Rfl:    fluticasone  (FLONASE ) 50 MCG/ACT nasal spray, Place 1 spray into both nostrils in the morning and at bedtime., Disp: 16 g, Rfl: 2   ipratropium (ATROVENT ) 0.02 % nebulizer solution, Take 2.5 mLs (0.5 mg total) by nebulization every 6 (six) hours as needed for wheezing or shortness of breath., Disp: 120 mL, Rfl: 12   ipratropium-albuterol  (DUONEB) 0.5-2.5 (3) MG/3ML SOLN, Take 3 mLs by nebulization every 6 (six) hours as needed., Disp: , Rfl:    levocetirizine  (XYZAL) 5 MG tablet, Take 5 mg by mouth every evening., Disp: , Rfl:    nystatin  (MYCOSTATIN ) 100000 UNIT/ML suspension, Take 5 mLs (500,000 Units total) by mouth 4 (four) times daily., Disp: 210 mL, Rfl: 0   predniSONE  (DELTASONE ) 10 MG tablet, 4 tabs for 3 days, then 3 tabs for 3 days, 2 tabs for 3 days, then 1 tab for 3 days, then stop, Disp: 30 tablet, Rfl: 0   sertraline  (ZOLOFT ) 100 MG tablet, Take 100 mg by mouth daily., Disp: , Rfl:    traZODone  (DESYREL ) 50 MG tablet, Take 50 mg by mouth at bedtime., Disp: , Rfl:    valsartan -hydrochlorothiazide  (DIOVAN -HCT) 160-12.5 MG tablet, Take 1 tablet by mouth daily., Disp: , Rfl:    vitamin B-12 (CYANOCOBALAMIN ) 100 MCG tablet, Take 100 mcg by mouth daily., Disp: , Rfl:    Biotin 1 MG CAPS, Take by mouth. (Patient not taking: Reported on 01/15/2024), Disp: , Rfl:    calcium  carbonate (OS-CAL - DOSED IN MG OF ELEMENTAL CALCIUM ) 1250 (500 Ca) MG tablet, Take 1 tablet by mouth. (Patient not taking: Reported on 01/15/2024), Disp: , Rfl:    nicotine  (NICODERM CQ  - DOSED IN MG/24 HOURS) 14 mg/24hr patch, Place 1 patch (14 mg total) onto the skin daily. (Patient not taking: Reported on 01/15/2024), Disp: 28 patch, Rfl: 2  Past Medical History: Past Medical History:  Diagnosis Date   Depression    Hypertension     Tobacco Use: Social History   Tobacco Use  Smoking Status Every Day  Current packs/day: 1.00   Average packs/day: 1 pack/day for 52.4 years (52.4 ttl pk-yrs)   Types: Cigarettes   Start date: 1973  Smokeless Tobacco Never  Tobacco Comments   10 cigarettes a day or a pack, most days     Labs: Review Flowsheet        No data to display          Capillary Blood Glucose: No results found for: GLUCAP   Pulmonary Assessment Scores:  Pulmonary Assessment Scores     Row Name 01/15/24 0915         ADL UCSD   ADL Phase Entry     SOB Score total 31       CAT Score   CAT Score 20       mMRC Score   mMRC Score 3        UCSD: Self-administered rating of dyspnea associated with activities of daily living (ADLs) 6-point scale (0 = not at all to 5 = maximal or unable to do because of breathlessness)  Scoring Scores range from 0 to 120.  Minimally important difference is 5 units  CAT: CAT can identify the health impairment of COPD patients and is better correlated with disease progression.  CAT has a scoring range of zero to 40. The CAT score is classified into four groups of low (less than 10), medium (10 - 20), high (21-30) and very high (31-40) based on the impact level of disease on health status. A CAT score over 10 suggests significant symptoms.  A worsening CAT score could be explained by an exacerbation, poor medication adherence, poor inhaler technique, or progression of COPD or comorbid conditions.  CAT MCID is 2 points  mMRC: mMRC (Modified Medical Research Council) Dyspnea Scale is used to assess the degree of baseline functional disability in patients of respiratory disease due to dyspnea. No minimal important difference is established. A decrease in score of 1 point or greater is considered a positive change.   Pulmonary Function Assessment:  Pulmonary Function Assessment - 01/15/24 0915       Breath   Shortness of Breath Yes;Limiting activity          Exercise Target Goals: Exercise Program Goal: Individual exercise prescription set using results from initial 6 min walk test and THRR while considering  patient's activity barriers and safety.   Exercise Prescription Goal: Initial exercise prescription builds to 30-45 minutes a day of aerobic activity, 2-3 days per week.  Home exercise guidelines will be given to patient during program as part of exercise prescription that the participant will acknowledge.  Activity Barriers & Risk Stratification:  Activity Barriers & Cardiac Risk Stratification - 01/15/24 0924       Activity Barriers & Cardiac Risk Stratification   Activity  Barriers Deconditioning;Muscular Weakness;Shortness of Breath          6 Minute Walk:  6 Minute Walk     Row Name 01/15/24 1027         6 Minute Walk   Phase Initial     Distance 410 feet     Walk Time 6 minutes     # of Rest Breaks 2  1:47-2:53, 4:10-5:30     MPH 0.78     METS 0.82     RPE 13.5     Perceived Dyspnea  4     VO2 Peak 2.86     Symptoms No     Resting HR 84 bpm  Resting BP 102/58     Resting Oxygen Saturation  92 %     Exercise Oxygen Saturation  during 6 min walk 88 %     Max Ex. HR 97 bpm     Max Ex. BP 128/70     2 Minute Post BP 100/60       Interval HR   1 Minute HR 130  artifact     2 Minute HR 97     3 Minute HR 86     4 Minute HR 98     5 Minute HR 90     6 Minute HR 92     2 Minute Post HR 85     Interval Heart Rate? Yes       Interval Oxygen   Interval Oxygen? Yes     Baseline Oxygen Saturation % 92 %     1 Minute Oxygen Saturation % 91 %     1 Minute Liters of Oxygen 0 L     2 Minute Oxygen Saturation % 88 %     2 Minute Liters of Oxygen 0 L     3 Minute Oxygen Saturation % 92 %     3 Minute Liters of Oxygen 0 L     4 Minute Oxygen Saturation % 91 %     4 Minute Liters of Oxygen 0 L     5 Minute Oxygen Saturation % 90 %     5 Minute Liters of Oxygen 0 L     6 Minute Oxygen Saturation % 93 %     6 Minute Liters of Oxygen 0 L     2 Minute Post Oxygen Saturation % 94 %     2 Minute Post Liters of Oxygen 0 L        Oxygen Initial Assessment:  Oxygen Initial Assessment - 01/15/24 0914       Home Oxygen   Home Oxygen Device None    Sleep Oxygen Prescription None    Home Exercise Oxygen Prescription None    Home Resting Oxygen Prescription None      Initial 6 min Walk   Oxygen Used None      Program Oxygen Prescription   Program Oxygen Prescription None      Intervention   Short Term Goals To learn and understand importance of maintaining oxygen saturations>88%;To learn and demonstrate proper use of respiratory  medications;To learn and understand importance of monitoring SPO2 with pulse oximeter and demonstrate accurate use of the pulse oximeter.;To learn and demonstrate proper pursed lip breathing techniques or other breathing techniques.     Long  Term Goals Maintenance of O2 saturations>88%;Verbalizes importance of monitoring SPO2 with pulse oximeter and return demonstration;Exhibits proper breathing techniques, such as pursed lip breathing or other method taught during program session;Compliance with respiratory medication;Demonstrates proper use of MDI's          Oxygen Re-Evaluation:   Oxygen Discharge (Final Oxygen Re-Evaluation):   Initial Exercise Prescription:  Initial Exercise Prescription - 01/15/24 1000       Date of Initial Exercise RX and Referring Provider   Date 01/15/24    Referring Provider Beam    Expected Discharge Date 04/12/24      NuStep   Level 1    SPM 60    Minutes 30    METs 1.5      Prescription Details   Frequency (times per week) 2    Duration Progress to 30 minutes of continuous  aerobic without signs/symptoms of physical distress      Intensity   THRR 40-80% of Max Heartrate 62-124    Ratings of Perceived Exertion 11-13    Perceived Dyspnea 0-4      Progression   Progression Continue to progress workloads to maintain intensity without signs/symptoms of physical distress.      Resistance Training   Training Prescription Yes    Weight red bands    Reps 10-15          Perform Capillary Blood Glucose checks as needed.  Exercise Prescription Changes:   Exercise Comments:   Exercise Goals and Review:   Exercise Goals     Row Name 01/15/24 0925             Exercise Goals   Increase Physical Activity Yes       Intervention Provide advice, education, support and counseling about physical activity/exercise needs.;Develop an individualized exercise prescription for aerobic and resistive training based on initial evaluation findings,  risk stratification, comorbidities and participant's personal goals.       Expected Outcomes Short Term: Attend rehab on a regular basis to increase amount of physical activity.;Long Term: Add in home exercise to make exercise part of routine and to increase amount of physical activity.;Long Term: Exercising regularly at least 3-5 days a week.       Increase Strength and Stamina Yes       Intervention Develop an individualized exercise prescription for aerobic and resistive training based on initial evaluation findings, risk stratification, comorbidities and participant's personal goals.;Provide advice, education, support and counseling about physical activity/exercise needs.       Expected Outcomes Short Term: Increase workloads from initial exercise prescription for resistance, speed, and METs.;Short Term: Perform resistance training exercises routinely during rehab and add in resistance training at home;Long Term: Improve cardiorespiratory fitness, muscular endurance and strength as measured by increased METs and functional capacity ( )       Able to understand and use rate of perceived exertion (RPE) scale Yes       Intervention Provide education and explanation on how to use RPE scale       Expected Outcomes Short Term: Able to use RPE daily in rehab to express subjective intensity level;Long Term:  Able to use RPE to guide intensity level when exercising independently       Able to understand and use Dyspnea scale Yes       Intervention Provide education and explanation on how to use Dyspnea scale       Expected Outcomes Short Term: Able to use Dyspnea scale daily in rehab to express subjective sense of shortness of breath during exertion;Long Term: Able to use Dyspnea scale to guide intensity level when exercising independently       Knowledge and understanding of Target Heart Rate Range (THRR) Yes       Intervention Provide education and explanation of THRR including how the numbers were  predicted and where they are located for reference       Expected Outcomes Short Term: Able to state/look up THRR;Long Term: Able to use THRR to govern intensity when exercising independently;Short Term: Able to use daily as guideline for intensity in rehab       Understanding of Exercise Prescription Yes       Intervention Provide education, explanation, and written materials on patient's individual exercise prescription       Expected Outcomes Short Term: Able to explain program exercise prescription;Long Term: Able to  explain home exercise prescription to exercise independently          Exercise Goals Re-Evaluation :   Discharge Exercise Prescription (Final Exercise Prescription Changes):   Nutrition:  Target Goals: Understanding of nutrition guidelines, daily intake of sodium 1500mg , cholesterol 200mg , calories 30% from fat and 7% or less from saturated fats, daily to have 5 or more servings of fruits and vegetables.  Biometrics:    Nutrition Therapy Plan and Nutrition Goals:   Nutrition Assessments:  MEDIFICTS Score Key: >=70 Need to make dietary changes  40-70 Heart Healthy Diet <= 40 Therapeutic Level Cholesterol Diet   Picture Your Plate Scores: <72 Unhealthy dietary pattern with much room for improvement. 41-50 Dietary pattern unlikely to meet recommendations for good health and room for improvement. 51-60 More healthful dietary pattern, with some room for improvement.  >60 Healthy dietary pattern, although there may be some specific behaviors that could be improved.    Nutrition Goals Re-Evaluation:   Nutrition Goals Discharge (Final Nutrition Goals Re-Evaluation):   Psychosocial: Target Goals: Acknowledge presence or absence of significant depression and/or stress, maximize coping skills, provide positive support system. Participant is able to verbalize types and ability to use techniques and skills needed for reducing stress and depression.  Initial  Review & Psychosocial Screening:  Initial Psych Review & Screening - 01/15/24 0920       Initial Review   Current issues with Current Anxiety/Panic;Current Psychotropic Meds;Current Stress Concerns    Source of Stress Concerns Family    Comments Pt admits to stressful job and home life but feels mediacation is helping her      Family Dynamics   Good Support System? Yes    Comments daughter and son      Barriers   Psychosocial barriers to participate in program There are no identifiable barriers or psychosocial needs.      Screening Interventions   Interventions Encouraged to exercise          Quality of Life Scores:  Scores of 19 and below usually indicate a poorer quality of life in these areas.  A difference of  2-3 points is a clinically meaningful difference.  A difference of 2-3 points in the total score of the Quality of Life Index has been associated with significant improvement in overall quality of life, self-image, physical symptoms, and general health in studies assessing change in quality of life.  PHQ-9: Review Flowsheet       01/15/2024  Depression screen PHQ 2/9  Decreased Interest 1  Down, Depressed, Hopeless 0  PHQ - 2 Score 1  Altered sleeping 0  Tired, decreased energy 0  Change in appetite 1  Feeling bad or failure about yourself  0  Trouble concentrating 0  Moving slowly or fidgety/restless 0  Suicidal thoughts 0  PHQ-9 Score 2  Difficult doing work/chores Not difficult at all   Interpretation of Total Score  Total Score Depression Severity:  1-4 = Minimal depression, 5-9 = Mild depression, 10-14 = Moderate depression, 15-19 = Moderately severe depression, 20-27 = Severe depression   Psychosocial Evaluation and Intervention:  Psychosocial Evaluation - 01/15/24 0921       Psychosocial Evaluation & Interventions   Interventions Stress management education;Relaxation education;Encouraged to exercise with the program and follow exercise  prescription    Comments Pt shows signs of anxiety and depression but feels medications are helping her.    Expected Outcomes For Shene to participate rehab free of psychosocial barriers.    Continue Psychosocial  Services  Follow up required by staff          Psychosocial Re-Evaluation:   Psychosocial Discharge (Final Psychosocial Re-Evaluation):   Education: Education Goals: Education classes will be provided on a weekly basis, covering required topics. Participant will state understanding/return demonstration of topics presented.  Learning Barriers/Preferences:  Learning Barriers/Preferences - 01/15/24 0920       Learning Barriers/Preferences   Learning Barriers None    Learning Preferences None          Education Topics: Know Your Numbers Group instruction that is supported by a PowerPoint presentation. Instructor discusses importance of knowing and understanding resting, exercise, and post-exercise oxygen saturation, heart rate, and blood pressure. Oxygen saturation, heart rate, blood pressure, rating of perceived exertion, and dyspnea are reviewed along with a normal range for these values.    Exercise for the Pulmonary Patient Group instruction that is supported by a PowerPoint presentation. Instructor discusses benefits of exercise, core components of exercise, frequency, duration, and intensity of an exercise routine, importance of utilizing pulse oximetry during exercise, safety while exercising, and options of places to exercise outside of rehab.    MET Level  Group instruction provided by PowerPoint, verbal discussion, and written material to support subject matter. Instructor reviews what METs are and how to increase METs.    Pulmonary Medications Verbally interactive group education provided by instructor with focus on inhaled medications and proper administration.   Anatomy and Physiology of the Respiratory System Group instruction provided by  PowerPoint, verbal discussion, and written material to support subject matter. Instructor reviews respiratory cycle and anatomical components of the respiratory system and their functions. Instructor also reviews differences in obstructive and restrictive respiratory diseases with examples of each.    Oxygen Safety Group instruction provided by PowerPoint, verbal discussion, and written material to support subject matter. There is an overview of "What is Oxygen" and "Why do we need it".  Instructor also reviews how to create a safe environment for oxygen use, the importance of using oxygen as prescribed, and the risks of noncompliance. There is a brief discussion on traveling with oxygen and resources the patient may utilize.   Oxygen Use Group instruction provided by PowerPoint, verbal discussion, and written material to discuss how supplemental oxygen is prescribed and different types of oxygen supply systems. Resources for more information are provided.    Breathing Techniques Group instruction that is supported by demonstration and informational handouts. Instructor discusses the benefits of pursed lip and diaphragmatic breathing and detailed demonstration on how to perform both.     Risk Factor Reduction Group instruction that is supported by a PowerPoint presentation. Instructor discusses the definition of a risk factor, different risk factors for pulmonary disease, and how the heart and lungs work together.   Pulmonary Diseases Group instruction provided by PowerPoint, verbal discussion, and written material to support subject matter. Instructor gives an overview of the different type of pulmonary diseases. There is also a discussion on risk factors and symptoms as well as ways to manage the diseases.   Stress and Energy Conservation Group instruction provided by PowerPoint, verbal discussion, and written material to support subject matter. Instructor gives an overview of stress and  the impact it can have on the body. Instructor also reviews ways to reduce stress. There is also a discussion on energy conservation and ways to conserve energy throughout the day.   Warning Signs and Symptoms Group instruction provided by PowerPoint, verbal discussion, and written material to support subject matter.  Instructor reviews warning signs and symptoms of stroke, heart attack, cold and flu. Instructor also reviews ways to prevent the spread of infection.   Other Education Group or individual verbal, written, or video instructions that support the educational goals of the pulmonary rehab program.    Knowledge Questionnaire Score:  Knowledge Questionnaire Score - 01/15/24 1033       Knowledge Questionnaire Score   Pre Score 17/18          Core Components/Risk Factors/Patient Goals at Admission:  Personal Goals and Risk Factors at Admission - 01/15/24 0923       Core Components/Risk Factors/Patient Goals on Admission    Weight Management Yes;Weight Loss    Intervention Weight Management: Develop a combined nutrition and exercise program designed to reach desired caloric intake, while maintaining appropriate intake of nutrient and fiber, sodium and fats, and appropriate energy expenditure required for the weight goal.;Weight Management: Provide education and appropriate resources to help participant work on and attain dietary goals.;Weight Management/Obesity: Establish reasonable short term and long term weight goals.;Obesity: Provide education and appropriate resources to help participant work on and attain dietary goals.    Expected Outcomes Short Term: Continue to assess and modify interventions until short term weight is achieved;Long Term: Adherence to nutrition and physical activity/exercise program aimed toward attainment of established weight goal;Weight Maintenance: Understanding of the daily nutrition guidelines, which includes 25-35% calories from fat, 7% or less cal  from saturated fats, less than 200mg  cholesterol, less than 1.5gm of sodium, & 5 or more servings of fruits and vegetables daily;Weight Loss: Understanding of general recommendations for a balanced deficit meal plan, which promotes 1-2 lb weight loss per week and includes a negative energy balance of [phone] kcal/d;Understanding recommendations for meals to include 15-35% energy as protein, 25-35% energy from fat, 35-60% energy from carbohydrates, less than 200mg  of dietary cholesterol, 20-35 gm of total fiber daily;Understanding of distribution of calorie intake throughout the day with the consumption of 4-5 meals/snacks    Tobacco Cessation Yes    Number of packs per day 1    Intervention Assist the participant in steps to quit. Provide individualized education and counseling about committing to Tobacco Cessation, relapse prevention, and pharmacological support that can be provided by physician.;Education officer, environmental, assist with locating and accessing local/national Quit Smoking programs, and support quit date choice.    Expected Outcomes Short Term: Will demonstrate readiness to quit, by selecting a quit date.;Long Term: Complete abstinence from all tobacco products for at least 12 months from quit date.;Short Term: Will quit all tobacco product use, adhering to prevention of relapse plan.    Improve shortness of breath with ADL's Yes    Intervention Provide education, individualized exercise plan and daily activity instruction to help decrease symptoms of SOB with activities of daily living.    Expected Outcomes Short Term: Improve cardiorespiratory fitness to achieve a reduction of symptoms when performing ADLs;Long Term: Be able to perform more ADLs without symptoms or delay the onset of symptoms          Core Components/Risk Factors/Patient Goals Review:    Core Components/Risk Factors/Patient Goals at Discharge (Final Review):    ITP Comments: Dr. Genetta Kenning is Medical  Director for Pulmonary Rehab at [facility].

## 2024-01-15 NOTE — Progress Notes (Signed)
 Pulmonary Rehab Orientation Physical Assessment Note    Well appearing, A&Ox4, NAD Lungs: Assessment completed by Cindra Cree RT Heart: Regular rate rhythm, no murmurs, no rubs, no clicks Gastrointestinal: abdomin soft, + bowel sounds in all 4 quads, denies recent weight gain or loss, endorses normal BMs Genitourinary: WNL, pt denies s/s Extremities:  +2 pulses, grip strength equal, strong, trace edema, no cyanosis, no clubbing Integumentary: pt denies any rashes, open or non healing wounds Psy/Soc: Pt continues to work full time. Concerned regarding the copay amount Assistive devices: none  Daina Cara Industrial/product designer, BSN Cardiac and Emergency planning/management officer

## 2024-01-15 NOTE — Progress Notes (Addendum)
 Tammy Cowan 68 y.o. female Pulmonary Rehab Orientation Note This patient who was referred to Pulmonary Rehab by Dr. Alfonso Angles with the diagnosis of Panlobular emphysema arrived today in Cardiac and Pulmonary Rehab. She  arrived ambulatory with normal gait. She  does not carry portable oxygen. Per patient, Tammy Cowan uses oxygen never. Color good, skin warm and dry. Patient is oriented to time and place. Patient's medical history, psychosocial health, and medications reviewed. Psychosocial assessment reveals patient lives with spouse and grandchild. Tammy Cowan is currently working full time. Patient hobbies include quilting. Patient reports her stress level is high. Areas of stress/anxiety include family  and work. Patient does exhibit signs of depression. Signs of depression include anxiety and difficulty maintaining sleep. PHQ2/9 score 1/2. Patients states she is taking medication for anxiety and depressions. She feels her medication is helping. Tammy Cowan shows good  coping skills with positive outlook on life. Offered emotional support and reassurance. Will continue to monitor. Physical assessment performed by Tammy Aland RN. Please see their orientation physical assessment note. Tammy Cowan reports she does take medications as prescribed. Patient states she follows a regular  diet. The patient shows desire to lose weight. Patient's weight will be monitored closely. Demonstration and practice of PLB using pulse oximeter. Tammy Cowan able to return demonstration satisfactorily. Safety and hand hygiene in the exercise area reviewed with patient. Tammy Cowan voices understanding of the information reviewed. Department expectations discussed with patient and achievable goals were set. The patient shows some enthusiasm about attending the program and we look forward to working with Tammy Cowan. Tammy Cowan completed a 6 min walk test today and is scheduled to begin exercise on 01/21/24 at 8:15 am.  [id number] Tammy Huron, MS, ACSM-CEP

## 2024-01-18 ENCOUNTER — Other Ambulatory Visit (HOSPITAL_COMMUNITY): Payer: Self-pay

## 2024-01-18 NOTE — Telephone Encounter (Signed)
 Received notification from [employer] regarding a prior authorization for NUCALA. Authorization has been APPROVED from 01/15/2024 to 07/15/2024. Approval letter sent to scan center.  Per test claim, copay for 28 days supply is $788.86  Authorization # [id number]  Submitted Patient Assistance Application to [facility] for NUCALA along with provider portion, patient portion, PA, medication list, insurance card copy. Will update patient when we receive a response.  Phone #: [phone] Fax #: [phone]

## 2024-01-21 ENCOUNTER — Encounter (HOSPITAL_COMMUNITY)
Admission: RE | Admit: 2024-01-21 | Discharge: 2024-01-21 | Disposition: A | Discharge status: Home / Self Care | Source: Ambulatory Visit | Attending: Pulmonary Disease

## 2024-01-21 DIAGNOSIS — J431 Panlobular emphysema: Secondary | ICD-10-CM | POA: Diagnosis not present

## 2024-01-21 NOTE — Progress Notes (Signed)
 Daily Session Note  Patient Details  Name: Tammy Cowan MRN: [id number] Date of Birth: 1956/06/30 Referring Provider:   Gattis Kass Pulmonary Rehab Walk Test from 01/15/2024 in [facility] for Heart, Vascular, & Lung Health  Referring Provider Beam    Encounter Date: 01/21/2024  Check In:  Session Check In - 01/21/24 7829       Check-In   Physician(s) Lawana Pray, NP    Location [facility]    Staff Present Atlas Lea, MS, ACSM-CEP, Exercise Physiologist;Mary Arlester Ladd, RN, BSN;Richards Pherigo Regis Captain BS, ACSM-CEP, Exercise Physiologist;Casey Felipe Horton, RT    Virtual Visit No    Medication changes reported     No    Fall or balance concerns reported    No    Tobacco Cessation No Change    Warm-up and Cool-down Not performed (comment)    Resistance Training Performed No    VAD Patient? No    PAD/SET Patient? No      Pain Assessment   Currently in Pain? No/denies    Multiple Pain Sites No          Capillary Blood Glucose: No results found for this or any previous visit (from the past 24 hours).    Social History   Tobacco Use  Smoking Status Every Day   Current packs/day: 1.00   Average packs/day: 1 pack/day for 52.5 years (52.5 ttl pk-yrs)   Types: Cigarettes   Start date: 1973  Smokeless Tobacco Never  Tobacco Comments   10 cigarettes a day or a pack, most days     Goals Met:  Exercise tolerated well No report of concerns or symptoms today Strength training completed today  Goals Unmet:  Not Applicable  Comments: Service time is from 0812 to 0931.    Dr. Genetta Kenning is Medical Director for Pulmonary Rehab at [facility].

## 2024-01-22 ENCOUNTER — Encounter: Payer: Self-pay | Admitting: Internal Medicine

## 2024-01-22 ENCOUNTER — Ambulatory Visit: Payer: Self-pay | Admitting: Internal Medicine

## 2024-01-22 VITALS — BP 104/64 | HR 80 | Resp 20 | Ht 62.5 in | Wt 213.5 lb

## 2024-01-22 DIAGNOSIS — J438 Other emphysema: Secondary | ICD-10-CM | POA: Diagnosis not present

## 2024-01-22 DIAGNOSIS — F17210 Nicotine dependence, cigarettes, uncomplicated: Secondary | ICD-10-CM

## 2024-01-22 DIAGNOSIS — I10 Essential (primary) hypertension: Secondary | ICD-10-CM

## 2024-01-22 DIAGNOSIS — J3089 Other allergic rhinitis: Secondary | ICD-10-CM | POA: Diagnosis not present

## 2024-01-22 MED ORDER — IPRATROPIUM BROMIDE 0.06 % NA SOLN: NASAL | 5 refills | Status: AC

## 2024-01-22 MED ORDER — MEPOLIZUMAB 100 MG ~~LOC~~ SOLR
100.0000 mg | Freq: Once | SUBCUTANEOUS | Status: AC
  Administered 2024-01-22: 100 mg via SUBCUTANEOUS

## 2024-01-22 MED ORDER — BUDESONIDE 0.5 MG/2ML IN SUSP: RESPIRATORY_TRACT | 12 refills | Status: AC

## 2024-01-22 MED ORDER — MONTELUKAST SODIUM 10 MG PO TABS: ORAL_TABLET | ORAL | 5 refills | Status: DC

## 2024-01-22 NOTE — Progress Notes (Signed)
 Received a sample dose of Nucala 100mg . Pulmonary will continue with this therapy. No issues after observed wait time.

## 2024-01-22 NOTE — Progress Notes (Signed)
 NEW PATIENT Date of Service/Encounter:  01/22/24 Referring provider: Roetta Clarke, NP Primary care provider: Jewell Mose, NP  Subjective:  BRYELLA DIVINEY is a 68 y.o. female  presenting today for evaluation of COPD  History obtained from: chart review and patient.   Discussed the use of AI scribe software for clinical note transcription with the patient, who gave verbal consent to proceed.  History of Present Illness SHIRLINE KENDLE is a 68 year old female with COPD who presents for evaluation of potential allergic components contributing to her COPD flares. She was referred by pulmonary for evaluation of potential allergic components contributing to her COPD flares.  She has a history of COPD with frequent flares affecting her chest, causing significant nasal congestion and headaches. Her symptoms include a sensation of being 'constantly clogged up' with congestion in both her chest and head, leading to difficulty with breathing techniques due to nasal congestion.  She experiences year-round allergy symptoms, including rhinorrhea and sneezing, with identified triggers such as grass, pollen, and tree pollen. No food allergies are reported, and she believes she is not allergic to her dog, although she is sensitive to cats. Her current medications include Flonase  nasal spray, Astelin , Xyzal at night, and Zyrtec  in the morning. Despite these medications, she continues to experience flare-ups and requires prednisone  approximately every other month.  She has a significant smoking history, having reduced from two and a half packs a day to half a pack a day. She acknowledges the impact of smoking on her respiratory symptoms and nasal congestion. Her family history includes her grandmother who died of bladder cancer related to smoking habits.  She has experienced thrush as a side effect of her Breztri  inhaler, which she manages with apple cider vinegar. She has not had a CT scan of her  sinuses and reports a history of a deviated septum, which was surgically corrected in her thirties.    Chart Review:  12/2022 eos 200  05/11/2023 PFT: FVC 73, FEV1 51, ratio 53, TLC 108, DLCO 54. No BD. Severe obstruction without reversibility and severe diffusion defect  12/09/2023 CXR: both lungs clear  12/09/23: FeNO 18ppb  Pulm note 01/11/24: Treated with depo 80mg , prednisone  taper for AE, submitted for nucala   COVID 2022, RSV 2023, PNA 2024 with hospitalization   Other allergy screening: Asthma: copd Rhino conjunctivitis: yes Food allergy: no Medication allergy: yes Hymenoptera allergy: no Urticaria: no Eczema:no History of recurrent infections suggestive of immunodeficency: no Vaccinations are up to date.   Past Medical History: Past Medical History:  Diagnosis Date   Depression    Hypertension    Medication List:  Current Outpatient Medications  Medication Sig Dispense Refill   albuterol  (VENTOLIN  HFA) 108 (90 Base) MCG/ACT inhaler Inhale 2 puffs into the lungs every 4 (four) hours as needed for wheezing or shortness of breath. 3 each 4   azelastine  (ASTELIN ) 0.1 % nasal spray Place 1 spray into both nostrils 2 (two) times daily. Use in each nostril as directed     Biotin 1 MG CAPS Take by mouth.     budeson-glycopyrrolate-formoterol (BREZTRI  AEROSPHERE) 160-9-4.8 MCG/ACT AERO inhaler Inhale 2 puffs into the lungs in the morning and at bedtime. 10.7 g 11   budeson-glycopyrrolate-formoterol (BREZTRI  AEROSPHERE) 160-9-4.8 MCG/ACT AERO inhaler Inhale 2 puffs into the lungs in the morning and at bedtime.     budesonide (PULMICORT) 0.5 MG/2ML nebulizer solution Dissolve 2mL in sinus rinse bottle, use twice daily. 30 mL 12  busPIRone  (BUSPAR ) 10 MG tablet Take 10 mg by mouth 3 (three) times daily.     calcium  carbonate (OS-CAL - DOSED IN MG OF ELEMENTAL CALCIUM ) 1250 (500 Ca) MG tablet Take 1 tablet by mouth.     cholecalciferol  (VITAMIN D3) 25 MCG (1000 UNIT) tablet Take 1,000  Units by mouth once a week.     fluticasone  (FLONASE ) 50 MCG/ACT nasal spray Place 1 spray into both nostrils in the morning and at bedtime. 16 g 2   ipratropium (ATROVENT ) 0.02 % nebulizer solution Take 2.5 mLs (0.5 mg total) by nebulization every 6 (six) hours as needed for wheezing or shortness of breath. 120 mL 12   ipratropium (ATROVENT ) 0.06 % nasal spray Use 1 sprays in each nostril up to 4 times daily 15 mL 5   ipratropium-albuterol  (DUONEB) 0.5-2.5 (3) MG/3ML SOLN Take 3 mLs by nebulization every 6 (six) hours as needed.     levocetirizine (XYZAL) 5 MG tablet Take 5 mg by mouth every evening.     montelukast (SINGULAIR) 10 MG tablet Take one tablet once daily 30 tablet 5   nicotine  (NICODERM CQ  - DOSED IN MG/24 HOURS) 14 mg/24hr patch Place 1 patch (14 mg total) onto the skin daily. 28 patch 2   nystatin  (MYCOSTATIN ) 100000 UNIT/ML suspension Take 5 mLs (500,000 Units total) by mouth 4 (four) times daily. 210 mL 0   predniSONE  (DELTASONE ) 10 MG tablet 4 tabs for 3 days, then 3 tabs for 3 days, 2 tabs for 3 days, then 1 tab for 3 days, then stop 30 tablet 0   sertraline  (ZOLOFT ) 100 MG tablet Take 100 mg by mouth daily.     traZODone  (DESYREL ) 50 MG tablet Take 50 mg by mouth at bedtime.     valsartan -hydrochlorothiazide  (DIOVAN -HCT) 160-12.5 MG tablet Take 1 tablet by mouth daily.     vitamin B-12 (CYANOCOBALAMIN ) 100 MCG tablet Take 100 mcg by mouth daily.     No current facility-administered medications for this visit.   Known Allergies:  Allergies  Allergen Reactions   Crestor [Rosuvastatin Calcium ] Diarrhea    Pt states diarrhea for months after taking crestor   Venlafaxine Other (See Comments)    Felt bad on it   Vibegron    Past Surgical History: Past Surgical History:  Procedure Laterality Date   CARPAL TUNNEL RELEASE Right 05/10/2020   Procedure: CARPAL TUNNEL RELEASE;  Surgeon: Brunilda Capra, MD;  Location: [facility];  Service: Orthopedics;  Laterality:  Right;  block in preop   CARPAL TUNNEL RELEASE Left 07/19/2020   Procedure: CARPAL TUNNEL RELEASE;  Surgeon: Brunilda Capra, MD;  Location: [facility];  Service: Orthopedics;  Laterality: Left;   CHOLECYSTECTOMY     CYST EXCISION Right 05/10/2020   Procedure: RIGHT LUNATE CURRETAGE CYST AND BONE GRAFTING  ALLOGRAFT;  Surgeon: Brunilda Capra, MD;  Location: [facility];  Service: Orthopedics;  Laterality: Right;  block in preop   TONSILLECTOMY     TRIGGER FINGER RELEASE Left 07/19/2020   Procedure: RELEASE TRIGGER FINGER/A-1 PULLEY LEFT SMALL FINGER;  Surgeon: Brunilda Capra, MD;  Location: [facility];  Service: Orthopedics;  Laterality: Left;   Family History: History reviewed. No pertinent family history. Social History: Mesha lives single-family home built in [address].  With the family room carpet in bedroom.  Gas heating central cooling.  1 dog with access to bedroom.  No roaches in the house and best to be off floor.  No dust mite  precaution.  Half a pack per day 45+ pack years.  Works as a Lexicographer.   ROS:  All other systems negative except as noted per HPI.  Objective:  Blood pressure 104/64, pulse 80, resp. rate 20, height 5' 2.5 (1.588 m), weight 213 lb 8 oz (96.8 kg), SpO2 93%. Body mass index is 38.43 kg/m. Physical Exam:  General Appearance:  Alert, cooperative, no distress, appears stated age  Head:  Normocephalic, without obvious abnormality, atraumatic  Eyes:  Conjunctiva clear, EOM's intact  Ears EACs normal bilaterally and normal TMs bilaterally  Nose: Nares normal, pale edematous nasal mucosa with clear rhinorrhea, septum deviated to the right, hypertrophic turbinates, and no visible anterior polyps  Throat: Lips, tongue normal; teeth and gums normal, + cobblestoning and mildly erythematous posterior oropharynx  Neck: Supple, symmetrical  Lungs:   Prolonged expiratory phase and clear to auscultation bilaterally,  Respirations unlabored, intermittent dry coughing  Heart:  regular rate and rhythm and no murmur, Appears well perfused  Extremities: No edema  Skin: Skin color, texture, turgor normal and no rashes or lesions on visualized portions of skin  Neurologic: No gross deficits   Diagnostics: Spirometry:  Tracings reviewed. Her effort: Good reproducible efforts. FVC: 1.79 L (pre),  FEV1: 0.97 L, 43% predicted (pre),  FEV1/FVC ratio: 54 (pre),  Interpretation: Spirometry consistent with mixed obstructive and restrictive disease.  Please see scanned spirometry results for details.   Labs:  Lab Orders         Allergens Zone 2       Assessment and Plan  Assessment and Plan Assessment & Plan Allergic Rhinitis Year-round allergic rhinitis with nasal congestion and headaches. Nasal congestion complicates COPD management. Possible triggers include pollen and cats. Smoking contributes to nasal congestion. - Switch from Flonase  to budesonide sinus rinses - See instructions for budesonide sinus rinse administration. -Start Atrovent  nasal spray: 1 spray per nostril up to 4 times a day as needed for drainage or postnasal drip - Continue Astelin  1 spray per nostril twice daily - Continue Xyzal 5 mg at night  -Can do additional dose for breakthrough symptoms - Start Singulair 10 mg at night - Order labs for environmental allergies  - Consider CT scan of sinuses if symptoms persist.  Chronic Obstructive Pulmonary Disease (COPD) COPD with frequent exacerbations. Pulmonologist initiated Nucala, pending patient assistance approval due to high copay costs. Difficulty with pursed lip breathing due to nasal congestion. -Finish prednisone  taper - Provide sample of Nucala to start treatment today. - Coordinate with insurance for [employer]. - Continue current COPD management as per pulmonologist.  - Breztri  2 puffs twice daily with spacer.  Rinse mouth after use  - DuoNebs 1 vial nebulized every  4-6 hours as needed  - Continue pulmonary rehab  Tobacco Use Disorder Smoking since age 63, currently reduced from two and a half packs to half a pack per day. Smoking exacerbates nasal congestion and complicates COPD management. - Encourage further reduction and cessation of smoking.  Follow up: we will call you with lab results and next steps  Follow up: with pulmonary for nucala and COPD management     This note in its entirety was forwarded to the Provider who requested this consultation.  Other: samples provided of: Nucala, Sinus rinse , reviewed spirometry technique, and reviewed inhaler technique  Thank you for your kind referral. I appreciate the opportunity to take part in Indie's care. Please do not hesitate to contact me with questions.  Sincerely,  Thank you so much for letting me partake in your care today.  Don't hesitate to reach out if you have any additional concerns!  Orelia Binet, MD  [facility] [place], [place]

## 2024-01-22 NOTE — Patient Instructions (Addendum)
 Allergic Rhinitis Year-round allergic rhinitis with nasal congestion and headaches. Nasal congestion complicates COPD management. Possible triggers include pollen and cats. Smoking contributes to nasal congestion. - Switch from Flonase  to budesonide sinus rinses - See instructions for budesonide sinus rinse administration. -Start Atrovent  nasal spray: 1 spray per nostril up to 4 times a day as needed for drainage or postnasal drip - Continue Astelin  1 spray per nostril twice daily - Continue Xyzal 5 mg at night  -Can do additional dose for breakthrough symptoms - Start Singulair 10 mg at night - Order labs for environmental allergies  - Consider CT scan of sinuses if symptoms persist.  Chronic Obstructive Pulmonary Disease (COPD) COPD with frequent exacerbations. Pulmonologist initiated Nucala, pending patient assistance approval due to high copay costs. Difficulty with pursed lip breathing due to nasal congestion. -Finish prednisone  taper - Provide sample of Nucala to start treatment today. - Coordinate with insurance for [employer]. - Continue current COPD management as per pulmonologist.  - Breztri  2 puffs twice daily with spacer.  Rinse mouth after use  - DuoNebs 1 vial nebulized every 4-6 hours as needed  - Continue pulmonary rehab  Tobacco Use Disorder Smoking since age 54, currently reduced from two and a half packs to half a pack per day. Smoking exacerbates nasal congestion and complicates COPD management. - Encourage further reduction and cessation of smoking.  Follow up: we will call you with lab results and next steps  Follow up: with pulmonary for nucala and COPD management   Budesonide Sinus Rinses:

## 2024-01-25 LAB — IGE+ALLERGENS ZONE 2(30)
Alternaria Alternata IgE: <0.1 kU/L
Amer Sycamore IgE Qn: <0.1 kU/L
Aspergillus Fumigatus IgE: <0.1 kU/L
Bahia Grass IgE: <0.1 kU/L
Bermuda Grass IgE: <0.1 kU/L
Cat Dander IgE: 3.38 kU/L — AB
Cedar, Mountain IgE: <0.1 kU/L
Cladosporium Herbarum IgE: <0.1 kU/L
Cockroach, American IgE: <0.1 kU/L
Common Silver Birch IgE: <0.1 kU/L
D Farinae IgE: <0.1 kU/L
D Pteronyssinus IgE: <0.1 kU/L
Dog Dander IgE: 0.29 kU/L — AB
Elm, American IgE: <0.1 kU/L
Hickory, White IgE: <0.1 kU/L
IgE (Immunoglobulin E), Serum: 522 [IU]/mL — ABNORMAL HIGH (ref 6–495)
Johnson Grass IgE: <0.1 kU/L
Maple/Box Elder IgE: <0.1 kU/L
Mucor Racemosus IgE: <0.1 kU/L
Mugwort IgE Qn: <0.1 kU/L
Nettle IgE: 0.1 kU/L — AB
Oak, White IgE: <0.1 kU/L
Penicillium Chrysogen IgE: <0.1 kU/L
Pigweed, Rough IgE: <0.1 kU/L
Plantain, English IgE: <0.1 kU/L
Ragweed, Short IgE: <0.1 kU/L
Sheep Sorrel IgE Qn: <0.1 kU/L
Stemphylium Herbarum IgE: <0.1 kU/L
Sweet gum IgE RAST Ql: <0.1 kU/L
Timothy Grass IgE: <0.1 kU/L
White Mulberry IgE: <0.1 kU/L

## 2024-01-26 ENCOUNTER — Ambulatory Visit: Payer: Self-pay | Admitting: Internal Medicine

## 2024-01-26 ENCOUNTER — Encounter (HOSPITAL_COMMUNITY)
Admission: RE | Admit: 2024-01-26 | Discharge: 2024-01-26 | Disposition: A | Discharge status: Home / Self Care | Source: Ambulatory Visit | Attending: Pulmonary Disease | Admitting: Pulmonary Disease

## 2024-01-26 VITALS — Wt 215.6 lb

## 2024-01-26 DIAGNOSIS — J431 Panlobular emphysema: Secondary | ICD-10-CM

## 2024-01-26 NOTE — Progress Notes (Signed)
 Daily Session Note  Patient Details  Name: Tammy Cowan MRN: [id number] Date of Birth: 1956/07/05 Referring Provider:   Conrad Ports Pulmonary Rehab Walk Test from 01/15/2024 in [facility] for Heart, Vascular, & Lung Health  Referring Provider Beam    Encounter Date: 01/26/2024  Check In:  Session Check In - 01/26/24 9177       Check-In   Supervising physician immediately available to respond to emergencies [facility] MD immediately available    Physician(s) Josefa Beauvais, NP    Location [facility]    Staff Present Johnnie Moats, MS, ACSM-CEP, Exercise Physiologist;Mary Harvy, RN, BSN;Kairyn Olmeda Midge BS, ACSM-CEP, Exercise Physiologist;Casey Claudene, RT    Virtual Visit No    Medication changes reported     No    Fall or balance concerns reported    No    Tobacco Cessation No Change    Warm-up and Cool-down Performed as group-led instruction    Resistance Training Performed Yes    VAD Patient? No    PAD/SET Patient? No      Pain Assessment   Currently in Pain? No/denies    Multiple Pain Sites No          Capillary Blood Glucose: No results found for this or any previous visit (from the past 24 hours).   Exercise Prescription Changes - 01/26/24 0900       Response to Exercise   Blood Pressure (Admit) 92/56    Blood Pressure (Exercise) 124/64    Blood Pressure (Exit) 94/58    Heart Rate (Admit) 75 bpm    Heart Rate (Exercise) 100 bpm    Heart Rate (Exit) 86 bpm    Oxygen Saturation (Admit) 94 %    Oxygen Saturation (Exercise) 92 %    Oxygen Saturation (Exit) 92 %    Rating of Perceived Exertion (Exercise) 11    Perceived Dyspnea (Exercise) 1    Duration Continue with 30 min of aerobic exercise without signs/symptoms of physical distress.    Intensity THRR unchanged      Progression   Progression Continue to progress workloads to maintain intensity without signs/symptoms of physical distress.    Average METs 1.5       Resistance Training   Training Prescription Yes    Weight red bands    Reps 10-15    Time 10 Minutes      NuStep   Level 1    SPM 70    Minutes 30    METs 1.5          Social History   Tobacco Use  Smoking Status Every Day   Current packs/day: 1.00   Average packs/day: 1 pack/day for 52.5 years (52.5 ttl pk-yrs)   Types: Cigarettes   Start date: 1973  Smokeless Tobacco Never  Tobacco Comments   10 cigarettes a day or a pack, most days     Goals Met:  Exercise tolerated well No report of concerns or symptoms today Strength training completed today  Goals Unmet:  Not Applicable  Comments: Service time is from 0809 to 0930.    Dr. Slater Staff is Medical Director for Pulmonary Rehab at [facility].

## 2024-01-26 NOTE — Progress Notes (Signed)
 Blood work showed positive to cat and borderline to dog and weed pollen.   Can we send avoidance measures out?  She should continue to follow with pulmonary, use the rhinitis medications prescribed at last visit and follow up with me for rhinitis symptoms in 6 months

## 2024-01-26 NOTE — Progress Notes (Signed)
 Discussed smoking cessation with pt. She is not ready for making a quit date yet. She has tried chantix but stopped due to cost. She cut down but has recently increased her amount of cigarettes a day. She voices that she smokes due to stress. I encouraged her to think about other ways of dealing with stress and to consider her long term goals in life. Pt given tips for mental smoking addiction as well as physical smoking addiction. Gave pt resources that she can investigate herself and encouraged her to ask any questions she might have. I encouraged her to think about setting a quit date. Pt is pre-contemplative. Will f/u.  [phone] Aliene Aris BS, ACSM-CEP 01/26/2024 9:48 AM

## 2024-01-27 ENCOUNTER — Telehealth: Payer: Self-pay | Admitting: Acute Care

## 2024-01-27 DIAGNOSIS — Z87891 Personal history of nicotine dependence: Secondary | ICD-10-CM

## 2024-01-27 DIAGNOSIS — F17210 Nicotine dependence, cigarettes, uncomplicated: Secondary | ICD-10-CM

## 2024-01-27 DIAGNOSIS — Z122 Encounter for screening for malignant neoplasm of respiratory organs: Secondary | ICD-10-CM

## 2024-01-27 NOTE — Telephone Encounter (Signed)
 Lung Cancer Screening Narrative/Criteria Questionnaire (Cigarette Smokers Only- No Cigars/Pipes/vapes)   Tammy Cowan   SDMV:02/09/2024 12:00 Kristen       Dec 23, 1955   LDCT: 02/10/2024 8:40am GI    68 y.o.   Phone: [phone]  Lung Screening Narrative (confirm age 31-77 yrs Medicare / 50-80 yrs Private pay insurance)   Insurance information:HTA   Referring Provider:Katie Parker [employer] NP   This screening involves an initial phone call with a team member from our program. It is called a shared decision making visit. The initial meeting is required by  insurance and Medicare to make sure you understand the program. This appointment takes about 15-20 minutes to complete. You will complete the screening scan at your scheduled date/time.  This scan takes about 5-10 minutes to complete. You can eat and drink normally before and after the scan.  Criteria questions for Lung Cancer Screening:   Are you a current or former smoker? Current Age began smoking: 68yo   If you are a former smoker, what year did you quit smoking? N/A(within 15 yrs)   To calculate your smoking history, I need an accurate estimate of how many packs of cigarettes you smoked per day and for how many years. (Not just the number of PPD you are now smoking)   Years smoking 52 x Packs per day 1.25 = Pack years 67   (at least 20 pack yrs)   (Make sure they understand that we need to know how much they have smoked in the past, not just the number of PPD they are smoking now)  Do you have a personal history of cancer?  No    Do you have a family history of cancer? Yes  (cancer type and and relative) father - esophageal   Are you coughing up blood?  No  Have you had unexplained weight loss of 15 lbs or more in the last 6 months? No  It looks like you meet all criteria.  When would be a good time for us  to schedule you for this screening?   Additional information: N/A

## 2024-01-28 ENCOUNTER — Encounter (HOSPITAL_COMMUNITY)
Admission: RE | Admit: 2024-01-28 | Discharge: 2024-01-28 | Disposition: A | Discharge status: Home / Self Care | Source: Ambulatory Visit | Attending: Pulmonary Disease | Admitting: Pulmonary Disease

## 2024-01-28 DIAGNOSIS — J431 Panlobular emphysema: Secondary | ICD-10-CM

## 2024-02-01 ENCOUNTER — Telehealth: Payer: Self-pay

## 2024-02-01 NOTE — Telephone Encounter (Signed)
 Copied from CRM [id number]. Topic: Clinical - Prescription Issue >> Feb 01, 2024 12:20 PM Rilla B wrote: Reason for CRM:  [facility] calling regarding script for Nucala . Need quantity and refill.   Direct phone # to pharmacist: [phone] Fax #: [phone]

## 2024-02-01 NOTE — Telephone Encounter (Addendum)
 Returned call to [facility] regarding Nucala  PAP. Received a fax from  [facility] regarding an approval for NUCALA  patient assistance from 01/26/2024 to 08/03/2024. Rep will refax approval letter.  Phone #: [phone] Fax #: [phone]  Spoke with patient. She actually received her first dose of Nucala  pen at allergy appointment on 01/22/2024. Patient denies any injection site reactions. Was monitored in office  Her next Nucal dose is due 02/19/2024  Sherry Pennant, PharmD, MPH, BCPS, CPP Clinical Pharmacist (Rheumatology and Pulmonology)

## 2024-02-01 NOTE — Telephone Encounter (Signed)
 Rx for Nucala  refaxed with qty and refills to [facility]  Spoke with patient. She actually received her first dose of Nucala  pen at allergy appointment on 01/22/2024. Patient denies any injection site reactions. Was monitored in office   Her next Nucala  dose is due 02/19/2024  Sherry Pennant, PharmD, MPH, BCPS, CPP Clinical Pharmacist (Rheumatology and Pulmonology)

## 2024-02-02 ENCOUNTER — Other Ambulatory Visit: Payer: Self-pay

## 2024-02-02 ENCOUNTER — Encounter (HOSPITAL_COMMUNITY)
Admission: RE | Admit: 2024-02-02 | Discharge: 2024-02-02 | Disposition: A | Discharge status: Home / Self Care | Source: Ambulatory Visit | Attending: Pulmonary Disease | Admitting: Pulmonary Disease

## 2024-02-02 DIAGNOSIS — J431 Panlobular emphysema: Secondary | ICD-10-CM | POA: Insufficient documentation

## 2024-02-03 ENCOUNTER — Ambulatory Visit: Admitting: Nurse Practitioner

## 2024-02-03 ENCOUNTER — Telehealth: Payer: Self-pay

## 2024-02-03 ENCOUNTER — Encounter: Payer: Self-pay | Admitting: Nurse Practitioner

## 2024-02-03 VITALS — BP 106/80 | HR 81 | Ht 61.0 in | Wt 213.8 lb

## 2024-02-03 DIAGNOSIS — B370 Candidal stomatitis: Secondary | ICD-10-CM | POA: Diagnosis not present

## 2024-02-03 DIAGNOSIS — Z720 Tobacco use: Secondary | ICD-10-CM

## 2024-02-03 DIAGNOSIS — J449 Chronic obstructive pulmonary disease, unspecified: Secondary | ICD-10-CM

## 2024-02-03 DIAGNOSIS — F17210 Nicotine dependence, cigarettes, uncomplicated: Secondary | ICD-10-CM

## 2024-02-03 DIAGNOSIS — J302 Other seasonal allergic rhinitis: Secondary | ICD-10-CM

## 2024-02-03 MED ORDER — FLUCONAZOLE 100 MG PO TABS: ORAL_TABLET | ORAL | 0 refills | Status: DC

## 2024-02-03 NOTE — Assessment & Plan Note (Signed)
 Recurrent thrush. Started on diflucan - side effect profile reviewed.

## 2024-02-03 NOTE — Assessment & Plan Note (Signed)
 Positive scratch test positive to cat and dog dander. Follow up with allergist as scheduled

## 2024-02-03 NOTE — Patient Instructions (Addendum)
 Continue Albuterol  inhaler 2 puffs or 3 mL neb every 6 hours as needed for shortness of breath or wheezing. Notify if symptoms persist despite rescue inhaler/neb use.  Continue Breztri  2 puffs Twice daily. Brush tongue and rinse mouth afterwards. Use with spacer Continue nasal sprays as prescribed Continue current allergy pills   Continue Nucala  injections every 4 weeks for management of your COPD   Attend lung cancer screening CT chest scheduled 7/9   Continue with pulmonary rehab    Follow up in Dr. [name] in 3 months. If symptoms do not improve or worsen, please contact office for sooner follow up or seek emergency care

## 2024-02-03 NOTE — Telephone Encounter (Signed)
 Received incoming call from pharmacy verifying dosage information for Nucala .  RX information has been updated.

## 2024-02-03 NOTE — Assessment & Plan Note (Signed)
 Smoking cessation advised. LDCT LCS CT scheduled 7/9 with lung cancer screening program

## 2024-02-03 NOTE — Progress Notes (Signed)
 @Patient  ID: Tammy Cowan, female    DOB: 1955/09/05, 68 y.o.   MRN: [id number]  Chief Complaint  Patient presents with   Follow-up    Patient states medication has helped with symptoms.    Referring provider: [name], [name], NP  HPI: 68 year old female, active smoker followed for COPD with emphysema. She is a patient of Dr. Leatha and last seen in office 01/11/2024 by Malachy NP. Past medical history significant for HTN, HLD, allergic rhinitis.   TEST/EVENTS:  12/2022 eos 200  05/11/2023 PFT: FVC 73, FEV1 51, ratio 53, TLC 108, DLCO 54. No BD. Severe obstruction without reversibility and severe diffusion defect  12/09/2023 CXR: both lungs clear   08/12/2023: OV with Dr. [name]. In the past, Breo made throat sore. Tried Breztri . Seemed to help but some issues with tongue discomfort. Used Stiolto in the interim. Never used this. She met with her PCP; diagnosed with thrush. Placed on mouthwash. Uses this with every dose of Breztri . Has resumed Breztri . Breathing is better.   12/09/2023: SHERLEAN with Israella Hubert NP  Tammy Cowan is a 68 year old female with moderate to severe COPD who presents with a flare-up of respiratory symptoms. She has been experiencing exacerbations of her COPD symptoms since her last visit in January. She saw her PCP yesterday for flare-up, she was started on azithromycin , a steroid shot, and a five-day course of prednisone . She always improves with steroid courses but then gets worse as soon as she comes off. Currently, she has a productive cough with clear sputum, but no fever, chills, or hemoptysis. Still wheezing. She restarted Breztri  two weeks ago. She had been off inhalers prior to this. She sometimes uses nystatin  mouthwash to manage thrush symptoms, with improvement. No issues with thrush today.  She has a history of smoking and is attempting to quit, having previously used Chantix successfully for two months. She's been off Chantix now but may considered resuming. She  is concerned about weight gain, attributing it to prolonged prednisone  use, and reports gaining approximately forty pounds over the last year. She has a history of COVID-19 in 2022, RSV in 2023, and pneumonia requiring hospitalization last year.  She reports chronic sinus issues with clear nasal discharge and frequent headaches over the past two months, which she attributes to allergies. She uses Astelin  nasal spray, flonase , Zyrtec . No significant leg swelling, but occasional ankle puffiness is noted with steroid use.  Her current medications include Breztri , DuoNebs. She has not used a breathing treatment today due to work interruptions. She feels better in the mornings but experiences increased difficulty breathing as the day progresses. FeNO 18 ppb   01/11/2024: Today - follow up Discussed the use of AI scribe software for clinical note transcription with the patient, who gave verbal consent to proceed. Tammy Cowan is a 68 year old female with COPD who presents with worsening respiratory symptoms. Her respiratory symptoms have worsened over the last week. She was feeling better after her last visit and completing steroids. She experiences wheezing and is coughing up mostly clear sputum. Has more issues with her activity tolerance due to her breathing. No fever, chills, or hemoptysis. She has a history of frequent steroid use this year, which she feels has contributed to weight gain. She is frustrated with the frequent need for steroids, noting they cause her to feel 'puffy'. She mentions experiencing a burning sensation on her tongue and the roof of her mouth, which she has been managing with baking  soda. She uses a spacer with her Breztri  inhaler most of the time, taking two puffs in the morning and evening. She also uses her nebulizer treatment twice a day. Her rescue inhaler, either Ventolin  or albuterol , is used every morning and evening. She is still smoking. High stress at home; doesn't feel  she can quit at this point.  She has a long history of smoking, having started at age 35 or 63. She smoked up to two and a half packs a day during stressful periods, such as when her husband first got sick with alcohol-induced dementia. Currently, she is trying to limit her smoking to half a pack a day, though she finds it challenging, especially on weekends when she is at home.     02/03/2024: Today - follow up Discussed the use of AI scribe software for clinical note transcription with the patient, who gave verbal consent to proceed.  History of Present Illness   Tammy Cowan is a 68 year old female with COPD who presents for follow-up regarding her Nucala  treatment.  She is currently undergoing treatment for COPD and has recently started on Nucala , an injectable medication, which we prescribed at her last visit. She actually received her first dose on June 20th at her allergist's office since they had samples without any adverse reactions. The pharmacy is arranging home delivery of the medication.  She has a history of allergies, particularly to cats, but does not own any. She has a mild allergy to dogs, but no allergy shots have been recommended. She is participating in pulmonary rehabilitation, which she finds challenging to attend due to her job, but she does feel like it is helping.   She still has some congestion still but cough is clear.  She experiences a burning sensation in her mouth, which she attributes to thrush. She uses nystatin  and apple cider vinegar with baking soda to manage these symptoms but still persists. She uses Mucinex , Flonase , and saline rinses to manage her sinus symptoms.       Allergies  Allergen Reactions   Crestor [Rosuvastatin Calcium ] Diarrhea    Pt states diarrhea for months after taking crestor   Venlafaxine Other (See Comments)    Felt bad on it   Vibegron     Immunization History  Administered Date(s) Administered   Moderna Sars-Covid-2  Vaccination 09/27/2019, 10/25/2019   PFIZER Comirnaty(Gray Top)Covid-19 Tri-Sucrose Vaccine 12/20/2020   PFIZER(Purple Top)SARS-COV-2 Vaccination 06/13/2020    Past Medical History:  Diagnosis Date   Depression    Hypertension     Tobacco History: Social History   Tobacco Use  Smoking Status Every Day   Current packs/day: 1.00   Average packs/day: 1 pack/day for 52.5 years (52.5 ttl pk-yrs)   Types: Cigarettes   Start date: 1973  Smokeless Tobacco Never  Tobacco Comments   10 cigarettes a day or a pack, most days    Ready to quit: Not Answered Counseling given: Not Answered Tobacco comments: 10 cigarettes a day or a pack, most days    Outpatient Medications Prior to Visit  Medication Sig Dispense Refill   albuterol  (VENTOLIN  HFA) 108 (90 Base) MCG/ACT inhaler Inhale 2 puffs into the lungs every 4 (four) hours as needed for wheezing or shortness of breath. 3 each 4   azelastine  (ASTELIN ) 0.1 % nasal spray Place 1 spray into both nostrils 2 (two) times daily. Use in each nostril as directed     Biotin 1 MG CAPS Take by mouth.  budeson-glycopyrrolate-formoterol (BREZTRI  AEROSPHERE) 160-9-4.8 MCG/ACT AERO inhaler Inhale 2 puffs into the lungs in the morning and at bedtime. 10.7 g 11   budeson-glycopyrrolate-formoterol (BREZTRI  AEROSPHERE) 160-9-4.8 MCG/ACT AERO inhaler Inhale 2 puffs into the lungs in the morning and at bedtime.     budesonide  (PULMICORT ) 0.5 MG/2ML nebulizer solution Dissolve 2mL in sinus rinse bottle, use twice daily. 30 mL 12   busPIRone  (BUSPAR ) 10 MG tablet Take 10 mg by mouth 3 (three) times daily.     calcium  carbonate (OS-CAL - DOSED IN MG OF ELEMENTAL CALCIUM ) 1250 (500 Ca) MG tablet Take 1 tablet by mouth.     cholecalciferol  (VITAMIN D3) 25 MCG (1000 UNIT) tablet Take 1,000 Units by mouth once a week.     fluticasone  (FLONASE ) 50 MCG/ACT nasal spray Place 1 spray into both nostrils in the morning and at bedtime. 16 g 2   ipratropium (ATROVENT ) 0.02  % nebulizer solution Take 2.5 mLs (0.5 mg total) by nebulization every 6 (six) hours as needed for wheezing or shortness of breath. 120 mL 12   ipratropium (ATROVENT ) 0.06 % nasal spray Use 1 sprays in each nostril up to 4 times daily 15 mL 5   ipratropium-albuterol  (DUONEB) 0.5-2.5 (3) MG/3ML SOLN Take 3 mLs by nebulization every 6 (six) hours as needed.     levocetirizine (XYZAL) 5 MG tablet Take 5 mg by mouth every evening.     Mepolizumab  (NUCALA ) 100 MG/ML SOAJ Inject 100 mg into the skin every 28 (twenty-eight) days.     montelukast  (SINGULAIR ) 10 MG tablet Take one tablet once daily 30 tablet 5   nicotine  (NICODERM CQ  - DOSED IN MG/24 HOURS) 14 mg/24hr patch Place 1 patch (14 mg total) onto the skin daily. 28 patch 2   nystatin  (MYCOSTATIN ) 100000 UNIT/ML suspension Take 5 mLs (500,000 Units total) by mouth 4 (four) times daily. 210 mL 0   predniSONE  (DELTASONE ) 10 MG tablet 4 tabs for 3 days, then 3 tabs for 3 days, 2 tabs for 3 days, then 1 tab for 3 days, then stop 30 tablet 0   sertraline  (ZOLOFT ) 100 MG tablet Take 100 mg by mouth daily.     traZODone  (DESYREL ) 50 MG tablet Take 50 mg by mouth at bedtime.     valsartan -hydrochlorothiazide  (DIOVAN -HCT) 160-12.5 MG tablet Take 1 tablet by mouth daily.     vitamin B-12 (CYANOCOBALAMIN ) 100 MCG tablet Take 100 mcg by mouth daily.     No facility-administered medications prior to visit.     Review of Systems:   Constitutional: No weight loss or gain, night sweats, fevers, chills, or lassitude. +fatigue  HEENT: No headaches, difficulty swallowing, tooth/dental problems, or sore throat. No sneezing, itching, ear ache +chronic nasal congestion, post nasal drip CV:  No chest pain, orthopnea, PND, swelling in lower extremities, anasarca, dizziness, palpitations, syncope Resp: +shortness of breath with exertion; minimally productive cough (clear phlegm). No wheezing. No hemoptysis. No chest wall deformity GI:  No heartburn, indigestion,  abdominal pain, nausea, vomiting, diarrhea, change in bowel habits, loss of appetite, bloody stools.  GU: No dysuria, change in color of urine, urgency or frequency.  No flank pain, no hematuria  Skin: No rash, lesions, ulcerations MSK:  No joint pain or swelling.   Neuro: No dizziness or lightheadedness.  Psych: No depression or anxiety. Mood stable.     Physical Exam:  BP 106/80 (BP Location: Left Arm, Patient Position: Sitting, Cuff Size: Large)   Pulse 81   Ht 5' 1 (1.549  m)   Wt 213 lb 12.8 oz (97 kg)   SpO2 93%   BMI 40.40 kg/m   GEN: Pleasant, interactive, well-kempt; obese; in no acute distress HEENT:  Normocephalic and atraumatic. PERRLA. Sclera white. Nasal turbinates boggy, moist and patent bilaterally. No rhinorrhea present. Oropharynx erythematous and moist, without exudate or edema. White plaques/erythema to hard palate NECK:  Supple w/ fair ROM. No JVD present. Normal carotid impulses w/o bruits. Thyroid symmetrical with no goiter or nodules palpated. No lymphadenopathy.   CV: RRR, no m/r/g, no peripheral edema. Pulses intact, +2 bilaterally. No cyanosis, pallor or clubbing. PULMONARY:  Unlabored, regular breathing. Mild bibasilar rhonchi otherwise clear b/l A&P. No accessory muscle use.  GI: BS present and normoactive. Soft, non-tender to palpation.  MSK: No erythema, warmth or tenderness. Cap refil <2 sec all extrem.  Neuro: A/Ox3. No focal deficits noted.   Skin: Warm, no lesions or rashe Psych: Normal affect and behavior. Judgement and thought content appropriate.     Lab Results:  CBC    Component Value Date/Time   WBC 8.5 12/14/2022 0109   RBC 5.70 (H) 12/14/2022 0109   HGB 16.8 (H) 12/14/2022 0109   HCT 49.8 (H) 12/14/2022 0109   PLT 299 12/14/2022 0109   MCV 87.4 12/14/2022 0109   MCH 29.5 12/14/2022 0109   MCHC 33.7 12/14/2022 0109   RDW 12.7 12/14/2022 0109   LYMPHSABS 0.9 12/14/2022 0109   MONOABS 0.1 12/14/2022 0109   EOSABS 0.0 12/14/2022  0109   BASOSABS 0.0 12/14/2022 0109    BMET    Component Value Date/Time   NA 132 (L) 12/14/2022 0109   K 4.2 12/14/2022 0109   CL 97 (L) 12/14/2022 0109   CO2 23 12/14/2022 0109   GLUCOSE 157 (H) 12/14/2022 0109   BUN 10 12/14/2022 0109   CREATININE 0.68 12/14/2022 0109   CALCIUM  9.4 12/14/2022 0109   GFRNONAA >60 12/14/2022 0109    BNP    Component Value Date/Time   BNP 42.6 12/10/2022 1801     Imaging:  No results found.   albuterol  (PROVENTIL ) (2.5 MG/3ML) 0.083% nebulizer solution 2.5 mg     Date Action Dose Route User   12/09/2023 1502 Given 2.5 mg Nebulization Obike, Mercy, CMA      mepolizumab  (NUCALA ) injection 100 mg     Date Action Dose Route User   01/22/2024 1019 Given 100 mg Subcutaneous (Right Arm) Sebastian Clotilda SAILOR, CMA      methylPREDNISolone  acetate (DEPO-MEDROL ) injection 80 mg     Date Action Dose Route User   01/11/2024 0903 Given 80 mg Intramuscular (Right Quadriceps) Trudy Warren CROME, CMA          Latest Ref Rng & Units 05/11/2023   10:52 AM  PFT Results  FVC-Pre L 2.17   FVC-Predicted Pre % 73   FVC-Post L 2.32   FVC-Predicted Post % 78   Pre FEV1/FVC % % 54   Post FEV1/FCV % % 53   FEV1-Pre L 1.16   FEV1-Predicted Pre % 51   FEV1-Post L 1.24   DLCO uncorrected ml/min/mmHg 10.11   DLCO UNC% % 54   DLCO corrected ml/min/mmHg 10.11   DLCO COR %Predicted % 54   DLVA Predicted % 59   TLC L 5.17   TLC % Predicted % 108   RV % Predicted % 144     No results found for: NITRICOXIDE      Assessment & Plan:   COPD, moderate (HCC) Moderate COPD  with allergic phenotype. High symptom burden and prone to recurrent exacerbations. Initiated on biologic therapy with Nucala ; first injection 6/20. Advised to continue therapy. Aware of side effect profile. Continue current regimen. Exacerbation resolved today. Encouraged continued mucociliary clearance therapy. Action plan in place. Graded exercises encouraged.  Patient  Instructions  Continue Albuterol  inhaler 2 puffs or 3 mL neb every 6 hours as needed for shortness of breath or wheezing. Notify if symptoms persist despite rescue inhaler/neb use.  Continue Breztri  2 puffs Twice daily. Brush tongue and rinse mouth afterwards. Use with spacer Continue nasal sprays as prescribed Continue current allergy pills   Continue Nucala  injections every 4 weeks for management of your COPD   Attend lung cancer screening CT chest scheduled 7/9   Continue with pulmonary rehab    Follow up in Dr. [name] in 3 months. If symptoms do not improve or worsen, please contact office for sooner follow up or seek emergency care   Allergic rhinitis Positive scratch test positive to cat and dog dander. Follow up with allergist as scheduled  Oral thrush Recurrent thrush. Started on diflucan - side effect profile reviewed.   Tobacco use Smoking cessation advised. LDCT LCS CT scheduled 7/9 with lung cancer screening program     Advised if symptoms do not improve or worsen, to please contact office for sooner follow up or seek emergency care.   I spent 35 minutes of dedicated to the care of this patient on the date of this encounter to include pre-visit review of records, face-to-face time with the patient discussing conditions above, post visit ordering of testing, clinical documentation with the electronic health record, making appropriate referrals as documented, and communicating necessary findings to members of the patients care team.  Comer LULLA Rouleau, NP 02/03/2024  Pt aware and understands NP's role.

## 2024-02-03 NOTE — Assessment & Plan Note (Signed)
 Moderate COPD with allergic phenotype. High symptom burden and prone to recurrent exacerbations. Initiated on biologic therapy with Nucala ; first injection 6/20. Advised to continue therapy. Aware of side effect profile. Continue current regimen. Exacerbation resolved today. Encouraged continued mucociliary clearance therapy. Action plan in place. Graded exercises encouraged.  Patient Instructions  Continue Albuterol  inhaler 2 puffs or 3 mL neb every 6 hours as needed for shortness of breath or wheezing. Notify if symptoms persist despite rescue inhaler/neb use.  Continue Breztri  2 puffs Twice daily. Brush tongue and rinse mouth afterwards. Use with spacer Continue nasal sprays as prescribed Continue current allergy pills   Continue Nucala  injections every 4 weeks for management of your COPD   Attend lung cancer screening CT chest scheduled 7/9   Continue with pulmonary rehab    Follow up in Dr. [name] in 3 months. If symptoms do not improve or worsen, please contact office for sooner follow up or seek emergency care

## 2024-02-03 NOTE — Telephone Encounter (Signed)
 Comer, patient will be scheduled as soon as possible.  Auth Submission: APPROVED Site of care: Site of care: [facility] Payer: [employer] Medication & CPT/J Code(s) submitted: Nucala  (Mepolizumab ) G7817 Diagnosis Code:  Route of submission (phone, fax, portal): fax Phone # Fax # [phone]  Auth type: Buy/Bill PB Units/visits requested: 100mg  x 3 doses Reference number: [id number] Approval from: 02/03/24 to 05/03/24

## 2024-02-03 NOTE — Telephone Encounter (Signed)
 Thanks

## 2024-02-04 ENCOUNTER — Encounter (HOSPITAL_COMMUNITY): Admission: RE | Admit: 2024-02-04 | Source: Ambulatory Visit

## 2024-02-04 ENCOUNTER — Telehealth: Payer: Self-pay | Admitting: Adult Health Nurse Practitioner

## 2024-02-04 NOTE — Telephone Encounter (Signed)
 Spoke with patient regarding prior message . Advised patient her script for was sent diflucan sent into [facility]. Spoke with [facility] to make sure patient script was in the pharmacy. Patient wanted to know if she can do her Nucala  shot's at home instead of coming into infusion for her shot's. Advised patient I will send a message to our pharmacy team to get help with that .    Patient's voice was understanding.

## 2024-02-04 NOTE — Telephone Encounter (Signed)
 BIV through pharmacy benefits was initiated on 01/13/24 and has been completed in it's entirety. Pt is also already approved for PAP as of 02/01/24.  Verified that [facility] pharmacy has her Rx for Nucala  on file and then contacted pt and provided instructions on how to obtain medication as well as the phone number. Confirmed with pt that she is comfortable self-administering, she states that she was instructed how to do it when she received it at her allergist's office and if all else fails my daughter is a Engineer, civil (consulting).   Informed pt that I am unsure how/why the infusion center came to be involved with her case, but regardless I notify them on her behalf that their services are no longer required. Pt verbalized understanding to all. Nothing further required at this time.

## 2024-02-04 NOTE — Telephone Encounter (Signed)
 Copied from CRM [id number]. Topic: Clinical - Prescription Issue >> Feb 04, 2024  9:34 AM Nathanel DEL wrote: Reason for CRM:  1. pt saw Comer Rouleau yesterday and Comer was to call in Doxycycline  for her ongoing thrush.  But it was not called into pharmacy.  Pt leaves work at 1:30pm  today and needs this Rx called before she leaves today into [facility] - [place], [place] - [address]  Phone: [phone] Fax: [phone]  Please call pt @ [phone]  2.  Pt understanding she could do the Nucala  injection at home. Pt spoke to Infusion clinic and they say she is to come into infusion clinic for injection, andd they cannot approve the change.  Can this be arranged to be done at her home?  Comer told her yesterday she could administer at home

## 2024-02-09 ENCOUNTER — Encounter (HOSPITAL_COMMUNITY)
Admission: RE | Admit: 2024-02-09 | Discharge: 2024-02-09 | Disposition: A | Discharge status: Home / Self Care | Source: Ambulatory Visit | Attending: Pulmonary Disease | Admitting: Pulmonary Disease

## 2024-02-09 ENCOUNTER — Encounter: Payer: Self-pay | Admitting: *Deleted

## 2024-02-09 ENCOUNTER — Ambulatory Visit (INDEPENDENT_AMBULATORY_CARE_PROVIDER_SITE_OTHER): Admitting: *Deleted

## 2024-02-09 DIAGNOSIS — J431 Panlobular emphysema: Secondary | ICD-10-CM | POA: Diagnosis present

## 2024-02-09 DIAGNOSIS — F17210 Nicotine dependence, cigarettes, uncomplicated: Secondary | ICD-10-CM | POA: Diagnosis not present

## 2024-02-09 NOTE — Progress Notes (Signed)
 Daily Session Note  Patient Details  Name: Tammy Cowan MRN: [id number] Date of Birth: 06/25/1956 Referring Provider:   Conrad [facility]s Pulmonary Rehab Walk Test from 01/15/2024 in [facility]  Referring Provider Beam    Encounter Date: 02/09/2024  Check In:  Session Check In - 02/09/24 0850       Check-In   Supervising physician immediately available to respond to emergencies [facility] MD immediately available    Physician(s) Damien Braver, NP    Location [facility]    Staff Present Johnnie Moats, MS, ACSM-CEP, Exercise Physiologist;Mary Harvy, RN, Wetzel Sharps, RT    Virtual Visit No    Medication changes reported     No    Fall or balance concerns reported    No    Tobacco Cessation No Change    Warm-up and Cool-down Performed as group-led instruction    Resistance Training Performed Yes    VAD Patient? No    PAD/SET Patient? No      Pain Assessment   Currently in Pain? No/denies    Multiple Pain Sites No          Capillary Blood Glucose: No results found for this or any previous visit (from the past 24 hours).   Exercise Prescription Changes - 02/09/24 0900       Response to Exercise   Blood Pressure (Admit) 122/74    Blood Pressure (Exercise) 130/70    Blood Pressure (Exit) 118/60    Heart Rate (Admit) 76 bpm    Heart Rate (Exercise) 89 bpm    Heart Rate (Exit) 79 bpm    Oxygen Saturation (Admit) 95 %    Oxygen Saturation (Exercise) 94 %    Oxygen Saturation (Exit) 96 %    Rating of Perceived Exertion (Exercise) 10    Perceived Dyspnea (Exercise) 1    Duration Continue with 30 min of aerobic exercise without signs/symptoms of physical distress.    Intensity THRR unchanged      Progression   Progression Continue to progress workloads to maintain intensity without signs/symptoms of physical distress.      Resistance Training   Training Prescription Yes    Weight red bands    Reps  10-15    Time 10 Minutes      NuStep   Level 1    SPM 82    Minutes 30    METs 1.7          Social History   Tobacco Use  Smoking Status Every Day   Current packs/day: 1.00   Average packs/day: 1 pack/day for 52.5 years (52.5 ttl pk-yrs)   Types: Cigarettes   Start date: 1973  Smokeless Tobacco Never  Tobacco Comments   10 cigarettes a day or a pack, most days     Goals Met:  Proper associated with RPD/PD & O2 Sat Exercise tolerated well No report of concerns or symptoms today Strength training completed today  Goals Unmet:  Not Applicable  Comments: Service time is from 0807 to 0932.    Dr. Slater Staff is Medical Director for Pulmonary Rehab at [facility]

## 2024-02-09 NOTE — Progress Notes (Signed)
  Virtual Visit via Telephone Note  I connected with Tammy Cowan on 02/09/24 at 12:00 PM EDT by telephone and verified that I am speaking with the correct person using two identifiers.  Location: Patient: in home Provider: [address] [place], [place], [address]   Shared Decision Making Visit Lung Cancer Screening Program [phone])   Eligibility: Age 68 y.o. Pack Years Smoking History Calculation 65 (# packs/per year x # years smoked) Recent History of coughing up blood  no Unexplained weight loss? no ( >Than 15 pounds within the last 6 months ) Prior History Lung / other cancer no (Diagnosis within the last 5 years already requiring surveillance chest CT Scans). Smoking Status Current Smoker Former Smokers: Years since quit: NA  Quit Date: NA  Visit Components: Discussion included one or more decision making aids. yes Discussion included risk/benefits of screening. yes Discussion included potential follow up diagnostic testing for abnormal scans. yes Discussion included meaning and risk of over diagnosis. yes Discussion included meaning and risk of False Positives. yes Discussion included meaning of total radiation exposure. yes  Counseling Included: Importance of adherence to annual lung cancer LDCT screening. yes Impact of comorbidities on ability to participate in the program. yes Ability and willingness to under diagnostic treatment. yes  Smoking Cessation Counseling: Current Smokers:  Discussed importance of smoking cessation. yes Information about tobacco cessation classes and interventions provided to patient. yes Patient provided with ticket for LDCT Scan. yes Symptomatic Patient. no  Counseling NA Diagnosis Code: Tobacco Use Z72.0 Asymptomatic Patient yes  Counseling (Intermediate counseling: > three minutes counseling) [id number] Former Smokers:  Discussed the importance of maintaining cigarette abstinence. yes Diagnosis Code: Personal History of  Nicotine  Dependence. S12.108 Information about tobacco cessation classes and interventions provided to patient. Yes Patient provided with ticket for LDCT Scan. yes Written Order for Lung Cancer Screening with LDCT placed in Epic. Yes (CT Chest Lung Cancer Screening Low Dose W/O CM) [id number] Z12.2-Screening of respiratory organs Z87.891-Personal history of nicotine  dependence   Josette Ranger, RN 02/09/24

## 2024-02-09 NOTE — Patient Instructions (Signed)

## 2024-02-10 ENCOUNTER — Ambulatory Visit
Admission: RE | Admit: 2024-02-10 | Discharge: 2024-02-10 | Disposition: A | Discharge status: Home / Self Care | Source: Ambulatory Visit | Attending: Acute Care | Admitting: Acute Care

## 2024-02-10 DIAGNOSIS — F17210 Nicotine dependence, cigarettes, uncomplicated: Secondary | ICD-10-CM

## 2024-02-10 DIAGNOSIS — Z122 Encounter for screening for malignant neoplasm of respiratory organs: Secondary | ICD-10-CM

## 2024-02-10 DIAGNOSIS — Z87891 Personal history of nicotine dependence: Secondary | ICD-10-CM

## 2024-02-10 NOTE — Progress Notes (Signed)
 Pulmonary Individual Treatment Plan  Patient Details  Name: Tammy Cowan MRN: [id number] Date of Birth: [birth year] Referring Provider:   Conrad Ports Pulmonary Rehab Walk Test from 01/15/2024 in [facility]  Referring Provider Beam    Initial Encounter Date:  Flowsheet Row Pulmonary Rehab Walk Test from 01/15/2024 in [facility]  Date 01/15/24    Visit Diagnosis: Panlobular emphysema (HCC)  Patient's Home Medications on Admission:   Current Outpatient Medications:    albuterol  (VENTOLIN  HFA) 108 (90 Base) MCG/ACT inhaler, Inhale 2 puffs into the lungs every 4 (four) hours as needed for wheezing or shortness of breath., Disp: 3 each, Rfl: 4   azelastine  (ASTELIN ) 0.1 % nasal spray, Place 1 spray into both nostrils 2 (two) times daily. Use in each nostril as directed, Disp: , Rfl:    Biotin 1 MG CAPS, Take by mouth., Disp: , Rfl:    budeson-glycopyrrolate-formoterol (BREZTRI  AEROSPHERE) 160-9-4.8 MCG/ACT AERO inhaler, Inhale 2 puffs into the lungs in the morning and at bedtime., Disp: 10.7 g, Rfl: 11   budeson-glycopyrrolate-formoterol (BREZTRI  AEROSPHERE) 160-9-4.8 MCG/ACT AERO inhaler, Inhale 2 puffs into the lungs in the morning and at bedtime., Disp: , Rfl:    budesonide  (PULMICORT ) 0.5 MG/2ML nebulizer solution, Dissolve 2mL in sinus rinse bottle, use twice daily., Disp: 30 mL, Rfl: 12   busPIRone  (BUSPAR ) 10 MG tablet, Take 10 mg by mouth 3 (three) times daily., Disp: , Rfl:    calcium  carbonate (OS-CAL - DOSED IN MG OF ELEMENTAL CALCIUM ) 1250 (500 Ca) MG tablet, Take 1 tablet by mouth., Disp: , Rfl:    cholecalciferol  (VITAMIN D3) 25 MCG (1000 UNIT) tablet, Take 1,000 Units by mouth once a week., Disp: , Rfl:    fluconazole  (DIFLUCAN ) 100 MG tablet, Take 200 mg (2 tablets) on day one then 100 mg (1 tab) daily for 6 more days, Disp: 8 tablet, Rfl: 0   fluticasone   (FLONASE ) 50 MCG/ACT nasal spray, Place 1 spray into both nostrils in the morning and at bedtime., Disp: 16 g, Rfl: 2   ipratropium (ATROVENT ) 0.02 % nebulizer solution, Take 2.5 mLs (0.5 mg total) by nebulization every 6 (six) hours as needed for wheezing or shortness of breath., Disp: 120 mL, Rfl: 12   ipratropium (ATROVENT ) 0.06 % nasal spray, Use 1 sprays in each nostril up to 4 times daily, Disp: 15 mL, Rfl: 5   ipratropium-albuterol  (DUONEB) 0.5-2.5 (3) MG/3ML SOLN, Take 3 mLs by nebulization every 6 (six) hours as needed., Disp: , Rfl:    levocetirizine (XYZAL) 5 MG tablet, Take 5 mg by mouth every evening., Disp: , Rfl:    Mepolizumab  (NUCALA ) 100 MG/ML SOAJ, Inject 100 mg into the skin every 28 (twenty-eight) days., Disp: , Rfl:    montelukast  (SINGULAIR ) 10 MG tablet, Take one tablet once daily, Disp: 30 tablet, Rfl: 5   nicotine  (NICODERM CQ  - DOSED IN MG/24 HOURS) 14 mg/24hr patch, Place 1 patch (14 mg total) onto the skin daily., Disp: 28 patch, Rfl: 2   nystatin  (MYCOSTATIN ) 100000 UNIT/ML suspension, Take 5 mLs (500,000 Units total) by mouth 4 (four) times daily., Disp: 210 mL, Rfl: 0   predniSONE  (DELTASONE ) 10 MG tablet, 4 tabs for 3 days, then 3 tabs for 3 days, 2 tabs for 3 days, then 1 tab for 3 days, then stop, Disp: 30 tablet, Rfl: 0   sertraline  (ZOLOFT ) 100 MG tablet, Take 100  mg by mouth daily., Disp: , Rfl:    traZODone  (DESYREL ) 50 MG tablet, Take 50 mg by mouth at bedtime., Disp: , Rfl:    valsartan -hydrochlorothiazide  (DIOVAN -HCT) 160-12.5 MG tablet, Take 1 tablet by mouth daily., Disp: , Rfl:    vitamin B-12 (CYANOCOBALAMIN ) 100 MCG tablet, Take 100 mcg by mouth daily., Disp: , Rfl:   Past Medical History: Past Medical History:  Diagnosis Date   Depression    Hypertension     Tobacco Use: Social History   Tobacco Use  Smoking Status Every Day   Current packs/day: 1.00   Average packs/day: 1 pack/day for 52.5 years (52.5 ttl pk-yrs)   Types: Cigarettes    Start date: 1973  Smokeless Tobacco Never  Tobacco Comments   10 cigarettes a day or a pack, most days     Labs: Review Flowsheet        No data to display          Capillary Blood Glucose: No results found for: GLUCAP   Pulmonary Assessment Scores:  Pulmonary Assessment Scores     Row Name 01/15/24 0915         ADL UCSD   ADL Phase Entry     SOB Score total 31       CAT Score   CAT Score 20       mMRC Score   mMRC Score 3       UCSD: Self-administered rating of dyspnea associated with activities of daily living (ADLs) 6-point scale (0 = not at all to 5 = maximal or unable to do because of breathlessness)  Scoring Scores range from 0 to 120.  Minimally important difference is 5 units  CAT: CAT can identify the health impairment of COPD patients and is better correlated with disease progression.  CAT has a scoring range of zero to 40. The CAT score is classified into four groups of low (less than 10), medium (10 - 20), high (21-30) and very high (31-40) based on the impact level of disease on health status. A CAT score over 10 suggests significant symptoms.  A worsening CAT score could be explained by an exacerbation, poor medication adherence, poor inhaler technique, or progression of COPD or comorbid conditions.  CAT MCID is 2 points  mMRC: mMRC (Modified Medical Research Council) Dyspnea Scale is used to assess the degree of baseline functional disability in patients of respiratory disease due to dyspnea. No minimal important difference is established. A decrease in score of 1 point or greater is considered a positive change.   Pulmonary Function Assessment:  Pulmonary Function Assessment - 01/15/24 0915       Breath   Shortness of Breath Yes;Limiting activity          Exercise Target Goals: Exercise Program Goal: Individual exercise prescription set using results from initial 6 min walk test and THRR while considering  patient's activity  barriers and safety.   Exercise Prescription Goal: Initial exercise prescription builds to 30-45 minutes a day of aerobic activity, 2-3 days per week.  Home exercise guidelines will be given to patient during program as part of exercise prescription that the participant will acknowledge.  Activity Barriers & Risk Stratification:  Activity Barriers & Cardiac Risk Stratification - 01/15/24 0924       Activity Barriers & Cardiac Risk Stratification   Activity Barriers Deconditioning;Muscular Weakness;Shortness of Breath          6 Minute Walk:  6 Minute Walk  Row Name 01/15/24 1027         6 Minute Walk   Phase Initial     Distance 410 feet     Walk Time 6 minutes     # of Rest Breaks 2  1:47-2:53, 4:10-5:30     MPH 0.78     METS 0.82     RPE 13.5     Perceived Dyspnea  4     VO2 Peak 2.86     Symptoms No     Resting HR 84 bpm     Resting BP 102/58     Resting Oxygen Saturation  92 %     Exercise Oxygen Saturation  during 6 min walk 88 %     Max Ex. HR 97 bpm     Max Ex. BP 128/70     2 Minute Post BP 100/60       Interval HR   1 Minute HR 130  artifact     2 Minute HR 97     3 Minute HR 86     4 Minute HR 98     5 Minute HR 90     6 Minute HR 92     2 Minute Post HR 85     Interval Heart Rate? Yes       Interval Oxygen   Interval Oxygen? Yes     Baseline Oxygen Saturation % 92 %     1 Minute Oxygen Saturation % 91 %     1 Minute Liters of Oxygen 0 L     2 Minute Oxygen Saturation % 88 %     2 Minute Liters of Oxygen 0 L     3 Minute Oxygen Saturation % 92 %     3 Minute Liters of Oxygen 0 L     4 Minute Oxygen Saturation % 91 %     4 Minute Liters of Oxygen 0 L     5 Minute Oxygen Saturation % 90 %     5 Minute Liters of Oxygen 0 L     6 Minute Oxygen Saturation % 93 %     6 Minute Liters of Oxygen 0 L     2 Minute Post Oxygen Saturation % 94 %     2 Minute Post Liters of Oxygen 0 L        Oxygen Initial Assessment:  Oxygen Initial Assessment  - 01/15/24 0914       Home Oxygen   Home Oxygen Device None    Sleep Oxygen Prescription None    Home Exercise Oxygen Prescription None    Home Resting Oxygen Prescription None      Initial 6 min Walk   Oxygen Used None      Program Oxygen Prescription   Program Oxygen Prescription None      Intervention   Short Term Goals To learn and understand importance of maintaining oxygen saturations>88%;To learn and demonstrate proper use of respiratory medications;To learn and understand importance of monitoring SPO2 with pulse oximeter and demonstrate accurate use of the pulse oximeter.;To learn and demonstrate proper pursed lip breathing techniques or other breathing techniques.     Long  Term Goals Maintenance of O2 saturations>88%;Verbalizes importance of monitoring SPO2 with pulse oximeter and return demonstration;Exhibits proper breathing techniques, such as pursed lip breathing or other method taught during program session;Compliance with respiratory medication;Demonstrates proper use of MDI's          Oxygen Re-Evaluation:  Oxygen  Re-Evaluation     Row Name 02/03/24 0953             Program Oxygen Prescription   Program Oxygen Prescription None         Home Oxygen   Home Oxygen Device None       Sleep Oxygen Prescription None       Home Exercise Oxygen Prescription None       Home Resting Oxygen Prescription None         Goals/Expected Outcomes   Short Term Goals To learn and understand importance of maintaining oxygen saturations>88%;To learn and demonstrate proper use of respiratory medications;To learn and understand importance of monitoring SPO2 with pulse oximeter and demonstrate accurate use of the pulse oximeter.;To learn and demonstrate proper pursed lip breathing techniques or other breathing techniques.        Long  Term Goals Maintenance of O2 saturations>88%;Verbalizes importance of monitoring SPO2 with pulse oximeter and return demonstration;Exhibits proper  breathing techniques, such as pursed lip breathing or other method taught during program session;Compliance with respiratory medication;Demonstrates proper use of MDI's       Goals/Expected Outcomes Compliance and understanding of oxygen saturation monitoring and breathing techniques to decrease shortness of breath.          Oxygen Discharge (Final Oxygen Re-Evaluation):  Oxygen Re-Evaluation - 02/03/24 0953       Program Oxygen Prescription   Program Oxygen Prescription None      Home Oxygen   Home Oxygen Device None    Sleep Oxygen Prescription None    Home Exercise Oxygen Prescription None    Home Resting Oxygen Prescription None      Goals/Expected Outcomes   Short Term Goals To learn and understand importance of maintaining oxygen saturations>88%;To learn and demonstrate proper use of respiratory medications;To learn and understand importance of monitoring SPO2 with pulse oximeter and demonstrate accurate use of the pulse oximeter.;To learn and demonstrate proper pursed lip breathing techniques or other breathing techniques.     Long  Term Goals Maintenance of O2 saturations>88%;Verbalizes importance of monitoring SPO2 with pulse oximeter and return demonstration;Exhibits proper breathing techniques, such as pursed lip breathing or other method taught during program session;Compliance with respiratory medication;Demonstrates proper use of MDI's    Goals/Expected Outcomes Compliance and understanding of oxygen saturation monitoring and breathing techniques to decrease shortness of breath.          Initial Exercise Prescription:  Initial Exercise Prescription - 01/15/24 1000       Date of Initial Exercise RX and Referring Provider   Date 01/15/24    Referring Provider Beam    Expected Discharge Date 04/12/24      NuStep   Level 1    SPM 60    Minutes 30    METs 1.5      Prescription Details   Frequency (times per week) 2    Duration Progress to 30 minutes of continuous  aerobic without signs/symptoms of physical distress      Intensity   THRR 40-80% of Max Heartrate 62-124    Ratings of Perceived Exertion 11-13    Perceived Dyspnea 0-4      Progression   Progression Continue to progress workloads to maintain intensity without signs/symptoms of physical distress.      Resistance Training   Training Prescription Yes    Weight red bands    Reps 10-15          Perform Capillary Blood Glucose checks as needed.  Exercise Prescription Changes:   Exercise Prescription Changes     Row Name 01/26/24 0900 02/09/24 0900           Response to Exercise   Blood Pressure (Admit) 92/56 122/74      Blood Pressure (Exercise) 124/64 130/70      Blood Pressure (Exit) 94/58 118/60      Heart Rate (Admit) 75 bpm 76 bpm      Heart Rate (Exercise) 100 bpm 89 bpm      Heart Rate (Exit) 86 bpm 79 bpm      Oxygen Saturation (Admit) 94 % 95 %      Oxygen Saturation (Exercise) 92 % 94 %      Oxygen Saturation (Exit) 92 % 96 %      Rating of Perceived Exertion (Exercise) 11 10      Perceived Dyspnea (Exercise) 1 1      Duration Continue with 30 min of aerobic exercise without signs/symptoms of physical distress. Continue with 30 min of aerobic exercise without signs/symptoms of physical distress.      Intensity THRR unchanged THRR unchanged        Progression   Progression Continue to progress workloads to maintain intensity without signs/symptoms of physical distress. Continue to progress workloads to maintain intensity without signs/symptoms of physical distress.      Average METs 1.5 --        Resistance Training   Training Prescription Yes Yes      Weight red bands red bands      Reps 10-15 10-15      Time 10 Minutes 10 Minutes        NuStep   Level 1 1      SPM 70 82      Minutes 30 30      METs 1.5 1.7         Exercise Comments:   Exercise Comments     Row Name 01/21/24 0943           Exercise Comments Pt completed first day of group  exercise. She exercised on the recumbent stepper for 30 min, level 1, METs 1.4. Tolerated fair. She performed warm up and cool down, including squats, however she is significantly deconditioned and needed rest stops. Discussed METs.          Exercise Goals and Review:   Exercise Goals     Row Name 01/15/24 0925             Exercise Goals   Increase Physical Activity Yes       Intervention Provide advice, education, support and counseling about physical activity/exercise needs.;Develop an individualized exercise prescription for aerobic and resistive training based on initial evaluation findings, risk stratification, comorbidities and participant's personal goals.       Expected Outcomes Short Term: Attend rehab on a regular basis to increase amount of physical activity.;Long Term: Add in home exercise to make exercise part of routine and to increase amount of physical activity.;Long Term: Exercising regularly at least 3-5 days a week.       Increase Strength and Stamina Yes       Intervention Develop an individualized exercise prescription for aerobic and resistive training based on initial evaluation findings, risk stratification, comorbidities and participant's personal goals.;Provide advice, education, support and counseling about physical activity/exercise needs.       Expected Outcomes Short Term: Increase workloads from initial exercise prescription for resistance, speed, and METs.;Short Term: Perform resistance training exercises  routinely during rehab and add in resistance training at home;Long Term: Improve cardiorespiratory fitness, muscular endurance and strength as measured by increased METs and functional capacity ( )       Able to understand and use rate of perceived exertion (RPE) scale Yes       Intervention Provide education and explanation on how to use RPE scale       Expected Outcomes Short Term: Able to use RPE daily in rehab to express subjective intensity level;Long  Term:  Able to use RPE to guide intensity level when exercising independently       Able to understand and use Dyspnea scale Yes       Intervention Provide education and explanation on how to use Dyspnea scale       Expected Outcomes Short Term: Able to use Dyspnea scale daily in rehab to express subjective sense of shortness of breath during exertion;Long Term: Able to use Dyspnea scale to guide intensity level when exercising independently       Knowledge and understanding of Target Heart Rate Range (THRR) Yes       Intervention Provide education and explanation of THRR including how the numbers were predicted and where they are located for reference       Expected Outcomes Short Term: Able to state/look up THRR;Long Term: Able to use THRR to govern intensity when exercising independently;Short Term: Able to use daily as guideline for intensity in rehab       Understanding of Exercise Prescription Yes       Intervention Provide education, explanation, and written materials on patient's individual exercise prescription       Expected Outcomes Short Term: Able to explain program exercise prescription;Long Term: Able to explain home exercise prescription to exercise independently          Exercise Goals Re-Evaluation :  Exercise Goals Re-Evaluation     Row Name 02/03/24 0951             Exercise Goal Re-Evaluation   Exercise Goals Review Increase Physical Activity;Able to understand and use Dyspnea scale;Understanding of Exercise Prescription;Increase Strength and Stamina;Knowledge and understanding of Target Heart Rate Range (THRR);Able to understand and use rate of perceived exertion (RPE) scale       Comments Tammy Cowan has completed 3 exercise sessions. She exercises for 30 min on the Nustep. Tammy Cowan averages 1.5 METs at level 1 on the Nustep. She performs the warmup and cooldown standing with rest breaks as needed. Tammy Cowan is very deconditioned. It is too soon to notate any discernable  progressions. Will continue to monitor and progress as able.       Expected Outcomes Through exercise at rehab and home, the patient will decrease shortness of breath with daily activities and feel confident in carrying out an exercise regimen at home.          Discharge Exercise Prescription (Final Exercise Prescription Changes):  Exercise Prescription Changes - 02/09/24 0900       Response to Exercise   Blood Pressure (Admit) 122/74    Blood Pressure (Exercise) 130/70    Blood Pressure (Exit) 118/60    Heart Rate (Admit) 76 bpm    Heart Rate (Exercise) 89 bpm    Heart Rate (Exit) 79 bpm    Oxygen Saturation (Admit) 95 %    Oxygen Saturation (Exercise) 94 %    Oxygen Saturation (Exit) 96 %    Rating of Perceived Exertion (Exercise) 10    Perceived Dyspnea (Exercise) 1  Duration Continue with 30 min of aerobic exercise without signs/symptoms of physical distress.    Intensity THRR unchanged      Progression   Progression Continue to progress workloads to maintain intensity without signs/symptoms of physical distress.      Resistance Training   Training Prescription Yes    Weight red bands    Reps 10-15    Time 10 Minutes      NuStep   Level 1    SPM 82    Minutes 30    METs 1.7          Nutrition:  Target Goals: Understanding of nutrition guidelines, daily intake of sodium 1500mg , cholesterol 200mg , calories 30% from fat and 7% or less from saturated fats, daily to have 5 or more servings of fruits and vegetables.  Biometrics:    Nutrition Therapy Plan and Nutrition Goals:  Nutrition Therapy & Goals - 01/21/24 0959       Nutrition Therapy   Diet General Healthy Diet      Personal Nutrition Goals   Nutrition Goal Patient to improve diet quality by using the plate method as a guide for meal planning to include lean protein/plant protein, fruits, vegetables, whole grains, nonfat dairy as part of a well-balanced diet.    Personal Goal #2 Patient to identify  strategies for weight loss of 0.5-2.0# per week.    Comments Tammy Cowan has medical history of COPD, emphysema, HTN, current smoker. She reports concerns of weight gain (~45#) over the last three years due to stress, prednisone , etc. She does continue to work full time. She lives at home with her husband, diagnosed with dementia, and grandson (age 45). She does not cook often and reports high calorie meals in the evening when her grandson cooks but does enjoy a wide variety of foods including fruits and vegetables, lean proteins, etc. Will continue to discuss strategies for weight loss including benefits of high protien/high fiber intake, calorie density, the plate method as a guide for meal planning, etc.  Patient will benefit from participation in pulmonaryrehab for nutrition, exercise, and lifestyle modification.      Intervention Plan   Intervention Prescribe, educate and counsel regarding individualized specific dietary modifications aiming towards targeted core components such as weight, hypertension, lipid management, diabetes, heart failure and other comorbidities.;Nutrition handout(s) given to patient.    Expected Outcomes Short Term Goal: Understand basic principles of dietary content, such as calories, fat, sodium, cholesterol and nutrients.;Long Term Goal: Adherence to prescribed nutrition plan.          Nutrition Assessments:  MEDIFICTS Score Key: >=70 Need to make dietary changes  40-70 Heart Healthy Diet <= 40 Therapeutic Level Cholesterol Diet   Picture Your Plate Scores: <59 Unhealthy dietary pattern with much room for improvement. 41-50 Dietary pattern unlikely to meet recommendations for good health and room for improvement. 51-60 More healthful dietary pattern, with some room for improvement.  >60 Healthy dietary pattern, although there may be some specific behaviors that could be improved.    Nutrition Goals Re-Evaluation:  Nutrition Goals Re-Evaluation     Row Name  01/21/24 0959             Goals   Current Weight 214 lb 11.7 oz (97.4 kg)       Comment LDL 110, HDL 43       Expected Outcome Tammy Cowan has medical history of COPD, emphysema, HTN, current smoker. She reports concerns of weight gain (~45#) over the last three years  due to stress, prednisone , etc. She does continue to work full time. She lives at home with her husband, diagnosed with dementia, and grandson (age 46). She does not cook often and reports high calorie meals in the evening when her grandson cooks but does enjoy a wide variety of foods including fruits and vegetables, lean proteins, etc. Will continue to discuss strategies for weight loss including benefits of high protien/high fiber intake, calorie density, the plate method as a guide for meal planning, etc. Patient will benefit from participation in pulmonaryrehab for nutrition, exercise, and lifestyle modification.          Nutrition Goals Discharge (Final Nutrition Goals Re-Evaluation):  Nutrition Goals Re-Evaluation - 01/21/24 0959       Goals   Current Weight 214 lb 11.7 oz (97.4 kg)    Comment LDL 110, HDL 43    Expected Outcome Tammy Cowan has medical history of COPD, emphysema, HTN, current smoker. She reports concerns of weight gain (~45#) over the last three years due to stress, prednisone , etc. She does continue to work full time. She lives at home with her husband, diagnosed with dementia, and grandson (age 61). She does not cook often and reports high calorie meals in the evening when her grandson cooks but does enjoy a wide variety of foods including fruits and vegetables, lean proteins, etc. Will continue to discuss strategies for weight loss including benefits of high protien/high fiber intake, calorie density, the plate method as a guide for meal planning, etc. Patient will benefit from participation in pulmonaryrehab for nutrition, exercise, and lifestyle modification.          Psychosocial: Target Goals: Acknowledge  presence or absence of significant depression and/or stress, maximize coping skills, provide positive support system. Participant is able to verbalize types and ability to use techniques and skills needed for reducing stress and depression.  Initial Review & Psychosocial Screening:  Initial Psych Review & Screening - 01/15/24 0920       Initial Review   Current issues with Current Anxiety/Panic;Current Psychotropic Meds;Current Stress Concerns    Source of Stress Concerns Family    Comments Pt admits to stressful job and home life but feels mediacation is helping her      Family Dynamics   Good Support System? Yes    Comments daughter and son      Barriers   Psychosocial barriers to participate in program There are no identifiable barriers or psychosocial needs.      Screening Interventions   Interventions Encouraged to exercise          Quality of Life Scores:  Scores of 19 and below usually indicate a poorer quality of life in these areas.  A difference of  2-3 points is a clinically meaningful difference.  A difference of 2-3 points in the total score of the Quality of Life Index has been associated with significant improvement in overall quality of life, self-image, physical symptoms, and general health in studies assessing change in quality of life.  PHQ-9: Review Flowsheet       01/15/2024  Depression screen PHQ 2/9  Decreased Interest 1  Down, Depressed, Hopeless 0  PHQ - 2 Score 1  Altered sleeping 0  Tired, decreased energy 0  Change in appetite 1  Feeling bad or failure about yourself  0  Trouble concentrating 0  Moving slowly or fidgety/restless 0  Suicidal thoughts 0  PHQ-9 Score 2  Difficult doing work/chores Not difficult at all   Interpretation of  Total Score  Total Score Depression Severity:  1-4 = Minimal depression, 5-9 = Mild depression, 10-14 = Moderate depression, 15-19 = Moderately severe depression, 20-27 = Severe depression   Psychosocial  Evaluation and Intervention:  Psychosocial Evaluation - 01/15/24 0921       Psychosocial Evaluation & Interventions   Interventions Stress management education;Relaxation education;Encouraged to exercise with the program and follow exercise prescription    Comments Pt shows signs of anxiety and depression but feels medications are helping her.    Expected Outcomes For Tammy Cowan to participate rehab free of psychosocial barriers.    Continue Psychosocial Services  Follow up required by staff          Psychosocial Re-Evaluation:  Psychosocial Re-Evaluation     Row Name 02/01/24 1545             Psychosocial Re-Evaluation   Current issues with Current Anxiety/Panic;Current Psychotropic Meds;Current Stress Concerns       Comments Tammy Cowan has attended 3 sessions so far. She is still dealing with a stressful home life and job. Staff will provide her with education and strategies on ways to reduce her stress. She denies any new psychosocial barriers or concerns.       Expected Outcomes For Tammy Cowan to participate in PR with less stress       Interventions Encouraged to attend Pulmonary Rehabilitation for the exercise;Stress management education;Relaxation education       Continue Psychosocial Services  No Follow up required          Psychosocial Discharge (Final Psychosocial Re-Evaluation):  Psychosocial Re-Evaluation - 02/01/24 1545       Psychosocial Re-Evaluation   Current issues with Current Anxiety/Panic;Current Psychotropic Meds;Current Stress Concerns    Comments Tammy Cowan has attended 3 sessions so far. She is still dealing with a stressful home life and job. Staff will provide her with education and strategies on ways to reduce her stress. She denies any new psychosocial barriers or concerns.    Expected Outcomes For Tammy Cowan to participate in PR with less stress    Interventions Encouraged to attend Pulmonary Rehabilitation for the exercise;Stress management education;Relaxation  education    Continue Psychosocial Services  No Follow up required          Education: Education Goals: Education classes will be provided on a weekly basis, covering required topics. Participant will state understanding/return demonstration of topics presented.  Learning Barriers/Preferences:  Learning Barriers/Preferences - 01/15/24 0920       Learning Barriers/Preferences   Learning Barriers None    Learning Preferences None          Education Topics: Know Your Numbers Group instruction that is supported by a PowerPoint presentation. Instructor discusses importance of knowing and understanding resting, exercise, and post-exercise oxygen saturation, heart rate, and blood pressure. Oxygen saturation, heart rate, blood pressure, rating of perceived exertion, and dyspnea are reviewed along with a normal range for these values.  Flowsheet Row PULMONARY REHAB OTHER RESPIRATORY from 01/28/2024 in [facility] for Heart, Vascular, & Lung Health  Date 01/28/24  Educator EP  Instruction Review Code 1- Verbalizes Understanding    Exercise for the Pulmonary Patient Group instruction that is supported by a PowerPoint presentation. Instructor discusses benefits of exercise, core components of exercise, frequency, duration, and intensity of an exercise routine, importance of utilizing pulse oximetry during exercise, safety while exercising, and options of places to exercise outside of rehab.  Flowsheet Row PULMONARY REHAB OTHER RESPIRATORY from 01/21/2024 in [employer]  [facility] for Heart, Vascular, & Lung Health  Date 01/21/24  Educator EP  Instruction Review Code 1- Verbalizes Understanding    MET Level  Group instruction provided by PowerPoint, verbal discussion, and written material to support subject matter. Instructor reviews what METs are and how to increase METs.    Pulmonary Medications Verbally interactive group education provided by  instructor with focus on inhaled medications and proper administration.   Anatomy and Physiology of the Respiratory System Group instruction provided by PowerPoint, verbal discussion, and written material to support subject matter. Instructor reviews respiratory cycle and anatomical components of the respiratory system and their functions. Instructor also reviews differences in obstructive and restrictive respiratory diseases with examples of each.    Oxygen Safety Group instruction provided by PowerPoint, verbal discussion, and written material to support subject matter. There is an overview of "What is Oxygen" and "Why do we need it".  Instructor also reviews how to create a safe environment for oxygen use, the importance of using oxygen as prescribed, and the risks of noncompliance. There is a brief discussion on traveling with oxygen and resources the patient may utilize.   Oxygen Use Group instruction provided by PowerPoint, verbal discussion, and written material to discuss how supplemental oxygen is prescribed and different types of oxygen supply systems. Resources for more information are provided.    Breathing Techniques Group instruction that is supported by demonstration and informational handouts. Instructor discusses the benefits of pursed lip and diaphragmatic breathing and detailed demonstration on how to perform both.     Risk Factor Reduction Group instruction that is supported by a PowerPoint presentation. Instructor discusses the definition of a risk factor, different risk factors for pulmonary disease, and how the heart and lungs work together.   Pulmonary Diseases Group instruction provided by PowerPoint, verbal discussion, and written material to support subject matter. Instructor gives an overview of the different type of pulmonary diseases. There is also a discussion on risk factors and symptoms as well as ways to manage the diseases.   Stress and Energy  Conservation Group instruction provided by PowerPoint, verbal discussion, and written material to support subject matter. Instructor gives an overview of stress and the impact it can have on the body. Instructor also reviews ways to reduce stress. There is also a discussion on energy conservation and ways to conserve energy throughout the day.   Warning Signs and Symptoms Group instruction provided by PowerPoint, verbal discussion, and written material to support subject matter. Instructor reviews warning signs and symptoms of stroke, heart attack, cold and flu. Instructor also reviews ways to prevent the spread of infection.   Other Education Group or individual verbal, written, or video instructions that support the educational goals of the pulmonary rehab program.    Knowledge Questionnaire Score:  Knowledge Questionnaire Score - 01/15/24 1033       Knowledge Questionnaire Score   Pre Score 17/18          Core Components/Risk Factors/Patient Goals at Admission:  Personal Goals and Risk Factors at Admission - 01/15/24 0923       Core Components/Risk Factors/Patient Goals on Admission    Weight Management Yes;Weight Loss    Intervention Weight Management: Develop a combined nutrition and exercise program designed to reach desired caloric intake, while maintaining appropriate intake of nutrient and fiber, sodium and fats, and appropriate energy expenditure required for the weight goal.;Weight Management: Provide education and appropriate resources to help participant work on and attain dietary goals.;Weight Management/Obesity:  Establish reasonable short term and long term weight goals.;Obesity: Provide education and appropriate resources to help participant work on and attain dietary goals.    Expected Outcomes Short Term: Continue to assess and modify interventions until short term weight is achieved;Long Term: Adherence to nutrition and physical activity/exercise program aimed toward  attainment of established weight goal;Weight Maintenance: Understanding of the daily nutrition guidelines, which includes 25-35% calories from fat, 7% or less cal from saturated fats, less than 200mg  cholesterol, less than 1.5gm of sodium, & 5 or more servings of fruits and vegetables daily;Weight Loss: Understanding of general recommendations for a balanced deficit meal plan, which promotes 1-2 lb weight loss per week and includes a negative energy balance of [phone] kcal/d;Understanding recommendations for meals to include 15-35% energy as protein, 25-35% energy from fat, 35-60% energy from carbohydrates, less than 200mg  of dietary cholesterol, 20-35 gm of total fiber daily;Understanding of distribution of calorie intake throughout the day with the consumption of 4-5 meals/snacks    Tobacco Cessation Yes    Number of packs per day 1    Intervention Assist the participant in steps to quit. Provide individualized education and counseling about committing to Tobacco Cessation, relapse prevention, and pharmacological support that can be provided by physician.;Education officer, environmental, assist with locating and accessing local/national Quit Smoking programs, and support quit date choice.    Expected Outcomes Short Term: Will demonstrate readiness to quit, by selecting a quit date.;Long Term: Complete abstinence from all tobacco products for at least 12 months from quit date.;Short Term: Will quit all tobacco product use, adhering to prevention of relapse plan.    Improve shortness of breath with ADL's Yes    Intervention Provide education, individualized exercise plan and daily activity instruction to help decrease symptoms of SOB with activities of daily living.    Expected Outcomes Short Term: Improve cardiorespiratory fitness to achieve a reduction of symptoms when performing ADLs;Long Term: Be able to perform more ADLs without symptoms or delay the onset of symptoms          Core Components/Risk  Factors/Patient Goals Review:   Goals and Risk Factor Review     Row Name 02/01/24 1548             Core Components/Risk Factors/Patient Goals Review   Personal Goals Review Weight Management/Obesity;Tobacco Cessation;Improve shortness of breath with ADL's;Develop more efficient breathing techniques such as purse lipped breathing and diaphragmatic breathing and practicing self-pacing with activity.       Review Monthly review of patient's Core Components/Risk Factors/Patient Goals are as follows: Goal progressing for tobacco cessation. Goal progressing for improving shortness of breath. Tammy Cowan is currently exercising on RA to maintain sats >88%. She is exercising on the Nustep for 30 minutes. Goal progressing for weight loss. Rickia is working with the staff dietitician on ways to obtain her weight loss goals. Goal progressing for developing more efficient breathing techniques such as purse lipped breathing and diaphragmatic breathing; and practicing self-pacing with activity. We will continue to monitor her progress throughout the program.       Expected Outcomes To improve shortness of breath with ADL's, develop more efficient breathing techniques such as purse lipped breathing and diaphragmatic breathing; and practicing self-pacing with activity, lose weight and quit smoking          Core Components/Risk Factors/Patient Goals at Discharge (Final Review):   Goals and Risk Factor Review - 02/01/24 1548       Core Components/Risk Factors/Patient Goals Review   Personal  Goals Review Weight Management/Obesity;Tobacco Cessation;Improve shortness of breath with ADL's;Develop more efficient breathing techniques such as purse lipped breathing and diaphragmatic breathing and practicing self-pacing with activity.    Review Monthly review of patient's Core Components/Risk Factors/Patient Goals are as follows: Goal progressing for tobacco cessation. Goal progressing for improving shortness of breath.  Tammy Cowan is currently exercising on RA to maintain sats >88%. She is exercising on the Nustep for 30 minutes. Goal progressing for weight loss. Tammy Cowan is working with the staff dietitician on ways to obtain her weight loss goals. Goal progressing for developing more efficient breathing techniques such as purse lipped breathing and diaphragmatic breathing; and practicing self-pacing with activity. We will continue to monitor her progress throughout the program.    Expected Outcomes To improve shortness of breath with ADL's, develop more efficient breathing techniques such as purse lipped breathing and diaphragmatic breathing; and practicing self-pacing with activity, lose weight and quit smoking          ITP Comments: Pt is making expected progress toward Pulmonary Rehab goals after completing 4 session(s). Recommend continued exercise, life style modification, education, and utilization of breathing techniques to increase stamina and strength, while also decreasing shortness of breath with exertion.  Dr. Slater Staff is Medical Director for Pulmonary Rehab at [facility].

## 2024-02-11 ENCOUNTER — Encounter (HOSPITAL_COMMUNITY)
Admission: RE | Admit: 2024-02-11 | Discharge: 2024-02-11 | Disposition: A | Discharge status: Home / Self Care | Source: Ambulatory Visit | Attending: Pulmonary Disease | Admitting: Pulmonary Disease

## 2024-02-11 DIAGNOSIS — J431 Panlobular emphysema: Secondary | ICD-10-CM | POA: Diagnosis not present

## 2024-02-11 NOTE — Progress Notes (Signed)
 Daily Session Note  Patient Details  Name: NARY SNEED MRN: [id number] Date of Birth: 1956/01/12 Referring Provider:   Conrad Ports Pulmonary Rehab Walk Test from 01/15/2024 in [facility] for Heart, Vascular, & Lung [facility]  Referring Provider Beam    Encounter Date: 02/11/2024  Check In:  Session Check In - 02/11/24 9177       Check-In   Supervising physician immediately available to respond to emergencies [facility] MD immediately available    Physician(s) Orbie Mose, NP    Location [facility]    Staff Present Johnnie Moats, MS, ACSM-CEP, Exercise Physiologist;Mary Harvy, RN, Wetzel Sharps, RT    Virtual Visit No    Medication changes reported     No    Fall or balance concerns reported    No    Tobacco Cessation No Change    Warm-up and Cool-down Performed as group-led instruction    Resistance Training Performed Yes    VAD Patient? No    PAD/SET Patient? No      Pain Assessment   Currently in Pain? No/denies    Multiple Pain Sites No          Capillary Blood Glucose: No results found for this or any previous visit (from the past 24 hours).    Social History   Tobacco Use  Smoking Status Every Day   Current packs/day: 1.00   Average packs/day: 1 pack/day for 52.5 years (52.5 ttl pk-yrs)   Types: Cigarettes   Start date: 1973  Smokeless Tobacco Never  Tobacco Comments   10 cigarettes a day or a pack, most days     Goals Met:  Proper associated with RPD/PD & O2 Sat Exercise tolerated well No report of concerns or symptoms today Strength training completed today  Goals Unmet:  Not Applicable  Comments: Service time is from 0809 to [phone].    Dr. Slater Staff is Medical Director for Pulmonary Rehab at [facility].

## 2024-02-16 ENCOUNTER — Telehealth (HOSPITAL_COMMUNITY): Payer: Self-pay

## 2024-02-16 ENCOUNTER — Encounter (HOSPITAL_COMMUNITY): Admission: RE | Admit: 2024-02-16 | Source: Ambulatory Visit

## 2024-02-16 NOTE — Telephone Encounter (Signed)
 Received message that pt would be missing PR class on 02/16/24 and 02/18/24 due to work conflict. Appointment canceled.

## 2024-02-18 ENCOUNTER — Encounter (HOSPITAL_COMMUNITY): Admission: RE | Admit: 2024-02-18 | Source: Ambulatory Visit

## 2024-02-23 ENCOUNTER — Encounter (HOSPITAL_COMMUNITY)
Admission: RE | Admit: 2024-02-23 | Discharge: 2024-02-23 | Disposition: A | Discharge status: Home / Self Care | Source: Ambulatory Visit | Attending: Pulmonary Disease | Admitting: Pulmonary Disease

## 2024-02-23 VITALS — Wt 213.6 lb

## 2024-02-23 DIAGNOSIS — J431 Panlobular emphysema: Secondary | ICD-10-CM

## 2024-02-23 NOTE — Progress Notes (Signed)
 Daily Session Note  Patient Details  Name: Tammy Cowan MRN: [id number] Date of Birth: Jul 26, 1956 Referring Provider:   Conrad Ports Pulmonary Rehab Walk Test from 01/15/2024 in [facility] for Heart, Vascular, & Lung Health  Referring Provider Beam    Encounter Date: 02/23/2024  Check In:  Session Check In - 02/23/24 9176       Check-In   Supervising physician immediately available to respond to emergencies [facility] MD immediately available    Physician(s) Rosaline Skains, NP    Location [facility]    Staff Present Johnnie Moats, MS, ACSM-CEP, Exercise Physiologist;Mary Harvy, RN, Wetzel Sharps, RT    Virtual Visit No    Medication changes reported     No    Fall or balance concerns reported    No    Tobacco Cessation No Change    Warm-up and Cool-down Performed as group-led instruction    Resistance Training Performed Yes    VAD Patient? No    PAD/SET Patient? No      Pain Assessment   Currently in Pain? No/denies    Multiple Pain Sites No          Capillary Blood Glucose: No results found for this or any previous visit (from the past 24 hours).   Exercise Prescription Changes - 02/23/24 0900       Response to Exercise   Blood Pressure (Admit) 128/70    Blood Pressure (Exercise) 122/70    Blood Pressure (Exit) 108/68    Heart Rate (Admit) 86 bpm    Heart Rate (Exercise) 85 bpm    Heart Rate (Exit) 84 bpm    Oxygen Saturation (Admit) 94 %    Oxygen Saturation (Exercise) 93 %    Oxygen Saturation (Exit) 95 %    Rating of Perceived Exertion (Exercise) 13    Perceived Dyspnea (Exercise) 1    Duration Continue with 30 min of aerobic exercise without signs/symptoms of physical distress.    Intensity THRR unchanged      Progression   Progression Continue to progress workloads to maintain intensity without signs/symptoms of physical distress.      Resistance Training   Training Prescription Yes    Weight red bands     Reps 10-15    Time 10 Minutes      NuStep   Level 1    SPM 78    Minutes 15    METs 1.6      Track   Laps 6    Minutes 10    METs 1.92          Social History   Tobacco Use  Smoking Status Every Day   Current packs/day: 1.00   Average packs/day: 1 pack/day for 52.6 years (52.6 ttl pk-yrs)   Types: Cigarettes   Start date: 1973  Smokeless Tobacco Never  Tobacco Comments   10 cigarettes a day or a pack, most days     Goals Met:  Proper associated with RPD/PD & O2 Sat Independence with exercise equipment Exercise tolerated well No report of concerns or symptoms today Strength training completed today  Goals Unmet:  Not Applicable  Comments: Service time is from 0813 to 0920.    Dr. Slater Staff is Medical Director for Pulmonary Rehab at [facility]

## 2024-02-25 ENCOUNTER — Encounter (HOSPITAL_COMMUNITY)
Admission: RE | Admit: 2024-02-25 | Discharge: 2024-02-25 | Disposition: A | Discharge status: Home / Self Care | Source: Ambulatory Visit | Attending: Pulmonary Disease

## 2024-02-25 DIAGNOSIS — J431 Panlobular emphysema: Secondary | ICD-10-CM

## 2024-02-25 NOTE — Progress Notes (Signed)
 Daily Session Note  Patient Details  Name: Tammy Cowan MRN: [id number] Date of Birth: [birth year] Referring Provider:   Conrad Ports Pulmonary Rehab Walk Test from 01/15/2024 in [facility] for Heart, Vascular, & Lung Health  Referring Provider Beam    Encounter Date: 02/25/2024  Check In:  Session Check In - 02/25/24 9180       Check-In   Supervising physician immediately available to respond to emergencies [facility] MD immediately available    Physician(s) Lum Louis, NP    Location [facility]    Staff Present Johnnie Moats, MS, ACSM-CEP, Exercise Physiologist;Muhamad Serano Harvy, RN, Wetzel Sharps, RT    Virtual Visit No    Medication changes reported     No    Fall or balance concerns reported    No    Tobacco Cessation No Change    Warm-up and Cool-down Performed as group-led instruction    Resistance Training Performed Yes    VAD Patient? No    PAD/SET Patient? No      Pain Assessment   Currently in Pain? No/denies    Multiple Pain Sites No          Capillary Blood Glucose: No results found for this or any previous visit (from the past 24 hours).    Social History   Tobacco Use  Smoking Status Every Day   Current packs/day: 1.00   Average packs/day: 1 pack/day for 52.6 years (52.6 ttl pk-yrs)   Types: Cigarettes   Start date: 1973  Smokeless Tobacco Never  Tobacco Comments   10 cigarettes a day or a pack, most days     Goals Met:  Independence with exercise equipment Exercise tolerated well Queuing for purse lip breathing No report of concerns or symptoms today Strength training completed today  Goals Unmet:  Not Applicable  Comments: Service time is from 0805 to 0915    Dr. Slater Staff is Medical Director for Pulmonary Rehab at [facility].

## 2024-03-01 ENCOUNTER — Telehealth (HOSPITAL_COMMUNITY): Payer: Self-pay

## 2024-03-01 ENCOUNTER — Encounter (HOSPITAL_COMMUNITY)
Admission: RE | Admit: 2024-03-01 | Discharge: 2024-03-01 | Disposition: A | Discharge status: Home / Self Care | Source: Ambulatory Visit | Attending: Pulmonary Disease | Admitting: Pulmonary Disease

## 2024-03-01 NOTE — Telephone Encounter (Signed)
 Called Tammy Cowan since she was absent from Pulmonary Rehab. Luca stated she had ankle swelling and could not come. Jerusha also voiced that she wanted to be discharged from Pulmonary Rehab due to work commitments. Will discharge patient.

## 2024-03-02 NOTE — Progress Notes (Signed)
 Discharge Progress Report  Patient Details  Name: Tammy Cowan MRN: [id number] Date of Birth: May 17, 1956 Referring Provider:   [facility] Walk Test from 01/15/2024 in [facility]  Referring Provider Beam     Number of Visits: 7  Reason for Discharge:  Early Exit:  Personal  Smoking History:  Social History   Tobacco Use  Smoking Status Every Day   Current packs/day: 1.00   Average packs/day: 1 pack/day for 52.6 years (52.6 ttl pk-yrs)   Types: Cigarettes   Start date: 1973  Smokeless Tobacco Never  Tobacco Comments   10 cigarettes a day or a pack, most days     Diagnosis:  Panlobular emphysema (HCC)  ADL [facility]:  Pulmonary Assessment Scores     Row Name 01/15/24 0915         ADL [facility]SD   ADL Phase Entry     SOB Score total 31       CAT Score   CAT Score 20       mMRC Score   mMRC Score 3        Initial Exercise Prescription:  Initial Exercise Prescription - 01/15/24 1000       Date of Initial Exercise RX and Referring Provider   Date 01/15/24    Referring Provider Beam    Expected Discharge Date 04/12/24      NuStep   Level 1    SPM 60    Minutes 30    METs 1.5      Prescription Details   Frequency (times per week) 2    Duration Progress to 30 minutes of continuous aerobic without signs/symptoms of physical distress      Intensity   THRR 40-80% of Max Heartrate 62-124    Ratings of Perceived Exertion 11-13    Perceived Dyspnea 0-4      Progression   Progression Continue to progress workloads to maintain intensity without signs/symptoms of physical distress.      Resistance Training   Training Prescription Yes    Weight red bands    Reps 10-15          Discharge Exercise Prescription (Final Exercise Prescription Changes):  Exercise Prescription Changes - 02/23/24 0900       Response to Exercise   Blood Pressure (Admit) 128/70    Blood Pressure  (Exercise) 122/70    Blood Pressure (Exit) 108/68    Heart Rate (Admit) 86 bpm    Heart Rate (Exercise) 85 bpm    Heart Rate (Exit) 84 bpm    Oxygen Saturation (Admit) 94 %    Oxygen Saturation (Exercise) 93 %    Oxygen Saturation (Exit) 95 %    Rating of Perceived Exertion (Exercise) 13    Perceived Dyspnea (Exercise) 1    Duration Continue with 30 min of aerobic exercise without signs/symptoms of physical distress.    Intensity THRR unchanged      Progression   Progression Continue to progress workloads to maintain intensity without signs/symptoms of physical distress.      Resistance Training   Training Prescription Yes    Weight red bands    Reps 10-15    Time 10 Minutes      NuStep   Level 1    SPM 78    Minutes 15    METs 1.6      Track   Laps 6    Minutes 10  METs 1.92          Functional Capacity:  6 Minute Walk     Row Name 01/15/24 1027         6 Minute Walk   Phase Initial     Distance 410 feet     Walk Time 6 minutes     # of Rest Breaks 2  1:47-2:53, 4:10-5:30     MPH 0.78     METS 0.82     RPE 13.5     Perceived Dyspnea  4     VO2 Peak 2.86     Symptoms No     Resting HR 84 bpm     Resting BP 102/58     Resting Oxygen Saturation  92 %     Exercise Oxygen Saturation  during 6 min walk 88 %     Max Ex. HR 97 bpm     Max Ex. BP 128/70     2 Minute Post BP 100/60       Interval HR   1 Minute HR 130  artifact     2 Minute HR 97     3 Minute HR 86     4 Minute HR 98     5 Minute HR 90     6 Minute HR 92     2 Minute Post HR 85     Interval Heart Rate? Yes       Interval Oxygen   Interval Oxygen? Yes     Baseline Oxygen Saturation % 92 %     1 Minute Oxygen Saturation % 91 %     1 Minute Liters of Oxygen 0 L     2 Minute Oxygen Saturation % 88 %     2 Minute Liters of Oxygen 0 L     3 Minute Oxygen Saturation % 92 %     3 Minute Liters of Oxygen 0 L     4 Minute Oxygen Saturation % 91 %     4 Minute Liters of Oxygen 0 L      5 Minute Oxygen Saturation % 90 %     5 Minute Liters of Oxygen 0 L     6 Minute Oxygen Saturation % 93 %     6 Minute Liters of Oxygen 0 L     2 Minute Post Oxygen Saturation % 94 %     2 Minute Post Liters of Oxygen 0 L        Psychological, QOL, Others - Outcomes: PHQ 2/9:    01/15/2024   10:31 AM  Depression screen PHQ 2/9  Decreased Interest 1  Down, Depressed, Hopeless 0  PHQ - 2 Score 1  Altered sleeping 0  Tired, decreased energy 0  Change in appetite 1  Feeling bad or failure about yourself  0  Trouble concentrating 0  Moving slowly or fidgety/restless 0  Suicidal thoughts 0  PHQ-9 Score 2  Difficult doing work/chores Not difficult at all    Quality of Life:   Personal Goals: Goals established at orientation with interventions provided to work toward goal.  Personal Goals and Risk Factors at Admission - 01/15/24 0923       Core Components/Risk Factors/Patient Goals on Admission    Weight Management Yes;Weight Loss    Intervention Weight Management: Develop a combined nutrition and exercise program designed to reach desired caloric intake, while maintaining appropriate intake of nutrient and fiber, sodium and fats, and appropriate energy expenditure  required for the weight goal.;Weight Management: Provide education and appropriate resources to help participant work on and attain dietary goals.;Weight Management/Obesity: Establish reasonable short term and long term weight goals.;Obesity: Provide education and appropriate resources to help participant work on and attain dietary goals.    Expected Outcomes Short Term: Continue to assess and modify interventions until short term weight is achieved;Long Term: Adherence to nutrition and physical activity/exercise program aimed toward attainment of established weight goal;Weight Maintenance: Understanding of the daily nutrition guidelines, which includes 25-35% calories from fat, 7% or less cal from saturated fats, less than  200mg  cholesterol, less than 1.5gm of sodium, & 5 or more servings of fruits and vegetables daily;Weight Loss: Understanding of general recommendations for a balanced deficit meal plan, which promotes 1-2 lb weight loss per week and includes a negative energy balance of [phone] kcal/d;Understanding recommendations for meals to include 15-35% energy as protein, 25-35% energy from fat, 35-60% energy from carbohydrates, less than 200mg  of dietary cholesterol, 20-35 gm of total fiber daily;Understanding of distribution of calorie intake throughout the day with the consumption of 4-5 meals/snacks    Tobacco Cessation Yes    Number of packs per day 1    Intervention Assist the participant in steps to quit. Provide individualized education and counseling about committing to Tobacco Cessation, relapse prevention, and pharmacological support that can be provided by physician.;Education officer, environmental, assist with locating and accessing local/national Quit Smoking programs, and support quit date choice.    Expected Outcomes Short Term: Will demonstrate readiness to quit, by selecting a quit date.;Long Term: Complete abstinence from all tobacco products for at least 12 months from quit date.;Short Term: Will quit all tobacco product use, adhering to prevention of relapse plan.    Improve shortness of breath with ADL's Yes    Intervention Provide education, individualized exercise plan and daily activity instruction to help decrease symptoms of SOB with activities of daily living.    Expected Outcomes Short Term: Improve cardiorespiratory fitness to achieve a reduction of symptoms when performing ADLs;Long Term: Be able to perform more ADLs without symptoms or delay the onset of symptoms           Personal Goals Discharge:  Goals and Risk Factor Review     Row Name 02/01/24 1548 03/02/24 0832           Core Components/Risk Factors/Patient Goals Review   Personal Goals Review Weight  Management/Obesity;Tobacco Cessation;Improve shortness of breath with ADL's;Develop more efficient breathing techniques such as purse lipped breathing and diaphragmatic breathing and practicing self-pacing with activity. Weight Management/Obesity;Tobacco Cessation;Improve shortness of breath with ADL's;Develop more efficient breathing techniques such as purse lipped breathing and diaphragmatic breathing and practicing self-pacing with activity.      Review Monthly review of patient's Core Components/Risk Factors/Patient Goals are as follows: Goal progressing for tobacco cessation. Goal progressing for improving shortness of breath. Tynia is currently exercising on RA to maintain sats >88%. She is exercising on the Nustep for 30 minutes. Goal progressing for weight loss. Tammye is working with the staff dietitician on ways to obtain her weight loss goals. Goal progressing for developing more efficient breathing techniques such as purse lipped breathing and diaphragmatic breathing; and practicing self-pacing with activity. We will continue to monitor her progress throughout the program. Navaeh was discharged from the [facility] program on 03/01/24 due to work commitments. Unfortunately, Lateasha did not meet any of her goals due to only attending 7 sessions.      Expected Outcomes To  improve shortness of breath with ADL's, develop more efficient breathing techniques such as purse lipped breathing and diaphragmatic breathing; and practicing self-pacing with activity, lose weight and quit smoking --         Exercise Goals and Review:  Exercise Goals     Row Name 01/15/24 0925             Exercise Goals   Increase Physical Activity Yes       Intervention Provide advice, education, support and counseling about physical activity/exercise needs.;Develop an individualized exercise prescription for aerobic and resistive training based on initial evaluation findings, risk stratification, comorbidities and participant's  personal goals.       Expected Outcomes Short Term: Attend rehab on a regular basis to increase amount of physical activity.;Long Term: Add in home exercise to make exercise part of routine and to increase amount of physical activity.;Long Term: Exercising regularly at least 3-5 days a week.       Increase Strength and Stamina Yes       Intervention Develop an individualized exercise prescription for aerobic and resistive training based on initial evaluation findings, risk stratification, comorbidities and participant's personal goals.;Provide advice, education, support and counseling about physical activity/exercise needs.       Expected Outcomes Short Term: Increase workloads from initial exercise prescription for resistance, speed, and METs.;Short Term: Perform resistance training exercises routinely during rehab and add in resistance training at home;Long Term: Improve cardiorespiratory fitness, muscular endurance and strength as measured by increased METs and functional capacity ( )       Able to understand and use rate of perceived exertion (RPE) scale Yes       Intervention Provide education and explanation on how to use RPE scale       Expected Outcomes Short Term: Able to use RPE daily in rehab to express subjective intensity level;Long Term:  Able to use RPE to guide intensity level when exercising independently       Able to understand and use Dyspnea scale Yes       Intervention Provide education and explanation on how to use Dyspnea scale       Expected Outcomes Short Term: Able to use Dyspnea scale daily in rehab to express subjective sense of shortness of breath during exertion;Long Term: Able to use Dyspnea scale to guide intensity level when exercising independently       Knowledge and understanding of Target Heart Rate Range (THRR) Yes       Intervention Provide education and explanation of THRR including how the numbers were predicted and where they are located for reference        Expected Outcomes Short Term: Able to state/look up THRR;Long Term: Able to use THRR to govern intensity when exercising independently;Short Term: Able to use daily as guideline for intensity in rehab       Understanding of Exercise Prescription Yes       Intervention Provide education, explanation, and written materials on patient's individual exercise prescription       Expected Outcomes Short Term: Able to explain program exercise prescription;Long Term: Able to explain home exercise prescription to exercise independently          Exercise Goals Re-Evaluation:  Exercise Goals Re-Evaluation     Row Name 02/03/24 0951 03/01/24 1605           Exercise Goal Re-Evaluation   Exercise Goals Review Increase Physical Activity;Able to understand and use Dyspnea scale;Understanding of Exercise Prescription;Increase Strength and Stamina;Knowledge and  understanding of Target Heart Rate Range (THRR);Able to understand and use rate of perceived exertion (RPE) scale Increase Physical Activity;Able to understand and use Dyspnea scale;Understanding of Exercise Prescription;Increase Strength and Stamina;Knowledge and understanding of Target Heart Rate Range (THRR);Able to understand and use rate of perceived exertion (RPE) scale      Comments Sibyl has completed 3 exercise sessions. She exercises for 30 min on the Nustep. Shanie averages 1.5 METs at level 1 on the Nustep. She performs the warmup and cooldown standing with rest breaks as needed. Emalina is very deconditioned. It is too soon to notate any discernable progressions. Will continue to monitor and progress as able. Pt completed 7 exercise sessions. Peak METs were 1.6 on the Nustep and 2.23 on the track. Lalena wishes to be discharged and plans to exercise at work.      Expected Outcomes Through exercise at rehab and home, the patient will decrease shortness of breath with daily activities and feel confident in carrying out an exercise regimen at home.  Through exercise at rehab and home, the patient will decrease shortness of breath with daily activities and feel confident in carrying out an exercise regimen at home.         Nutrition & Weight - Outcomes:    Nutrition:  Nutrition Therapy & Goals - 01/21/24 0959       Nutrition Therapy   Diet General Healthy Diet      Personal Nutrition Goals   Nutrition Goal Patient to improve diet quality by using the plate method as a guide for meal planning to include lean protein/plant protein, fruits, vegetables, whole grains, nonfat dairy as part of a well-balanced diet.    Personal Goal #2 Patient to identify strategies for weight loss of 0.5-2.0# per week.    Comments Tariana has medical history of COPD, emphysema, HTN, current smoker. She reports concerns of weight gain (~45#) over the last three years due to stress, prednisone , etc. She does continue to work full time. She lives at home with her husband, diagnosed with dementia, and grandson (age 38). She does not cook often and reports high calorie meals in the evening when her grandson cooks but does enjoy a wide variety of foods including fruits and vegetables, lean proteins, etc. Will continue to discuss strategies for weight loss including benefits of high protien/high fiber intake, calorie density, the plate method as a guide for meal planning, etc.  Patient will benefit from participation in pulmonaryrehab for nutrition, exercise, and lifestyle modification.      Intervention Plan   Intervention Prescribe, educate and counsel regarding individualized specific dietary modifications aiming towards targeted core components such as weight, hypertension, lipid management, diabetes, heart failure and other comorbidities.;Nutrition handout(s) given to patient.    Expected Outcomes Short Term Goal: Understand basic principles of dietary content, such as calories, fat, sodium, cholesterol and nutrients.;Long Term Goal: Adherence to prescribed nutrition  plan.          Nutrition Discharge:   Education Questionnaire Score:  Knowledge Questionnaire Score - 01/15/24 1033       Knowledge Questionnaire Score   Pre Score 17/18          Goals reviewed with patient; copy given to patient.

## 2024-03-03 ENCOUNTER — Encounter (HOSPITAL_COMMUNITY)

## 2024-03-08 ENCOUNTER — Encounter (HOSPITAL_COMMUNITY)

## 2024-03-10 ENCOUNTER — Encounter (HOSPITAL_COMMUNITY)

## 2024-03-15 ENCOUNTER — Encounter (HOSPITAL_COMMUNITY)

## 2024-03-17 ENCOUNTER — Other Ambulatory Visit: Payer: Self-pay | Admitting: Acute Care

## 2024-03-17 ENCOUNTER — Encounter (HOSPITAL_COMMUNITY)

## 2024-03-17 DIAGNOSIS — F17210 Nicotine dependence, cigarettes, uncomplicated: Secondary | ICD-10-CM

## 2024-03-17 DIAGNOSIS — Z87891 Personal history of nicotine dependence: Secondary | ICD-10-CM

## 2024-03-17 DIAGNOSIS — Z122 Encounter for screening for malignant neoplasm of respiratory organs: Secondary | ICD-10-CM

## 2024-03-22 ENCOUNTER — Encounter (HOSPITAL_COMMUNITY)

## 2024-03-24 ENCOUNTER — Encounter (HOSPITAL_COMMUNITY)

## 2024-03-29 ENCOUNTER — Encounter (HOSPITAL_COMMUNITY)

## 2024-03-31 ENCOUNTER — Encounter (HOSPITAL_COMMUNITY)

## 2024-04-05 ENCOUNTER — Encounter (HOSPITAL_COMMUNITY)

## 2024-04-07 ENCOUNTER — Encounter (HOSPITAL_COMMUNITY)

## 2024-04-12 ENCOUNTER — Encounter (HOSPITAL_COMMUNITY)

## 2024-04-13 ENCOUNTER — Ambulatory Visit (INDEPENDENT_AMBULATORY_CARE_PROVIDER_SITE_OTHER): Admitting: Pulmonary Disease

## 2024-04-13 ENCOUNTER — Encounter: Payer: Self-pay | Admitting: Pulmonary Disease

## 2024-04-13 VITALS — BP 130/72 | HR 77 | Ht 62.0 in | Wt 214.0 lb

## 2024-04-13 DIAGNOSIS — J449 Chronic obstructive pulmonary disease, unspecified: Secondary | ICD-10-CM

## 2024-04-13 NOTE — Progress Notes (Signed)
 @Patient  ID: Tammy Cowan, female    DOB: [birth year], 68 y.o.   MRN: [id number]  Chief Complaint  Patient presents with   Medical Management of Chronic Issues    Pt states still congested,SOB, Wheezing    Referring provider: Wallie Drafts, NP  HPI:   68 y.o. woman whom we are seeing for evaluation of dyspnea on exertion.  Most recent PCP note reviewed.  Most recent pulmonary note via NP reviewed.  Patient returns in follow-up.  Started Nucala  in the interim.  Tolerating Breztri .  A lot of issues with thrush but able to tolerate with certain modifications with rinsing etc.  Seen by allergist.  Not a lot of changes.  Continues on aggressive nasal regimen.  Unable to tolerate rinses with budesonide .  Back on Flonase , ipratropium, azelastine .  She thinks the Nucala  has helped some.  As well as the cooler weather.  Breathing a little more clearly.  Less tightness etc. she remains dyspneic while carrying things.  She like to be able to get back to normal.  But has not felt normal in over a year since multiple bouts of pneumonia RSV COVID etc.  CT lung cancer screening in the interim reviewed, significant emphysematous changes.  She continues to smoke.  HPI initial visit: Patient with  breathing issues prior to hospitalization for pneumonia 12/2022.  She presented meeting SIRS criteria.  Chest x-ray reviewed and interpreted as mild interstitial thickening diffusely as well as hazy left lower lobe infiltrate.  She started on antibiotics and given steroids given history of concern of COPD.  She has had no PFTs to diagnose this.  She suddenly improved rather rapidly.  Discharged with a course of prednisone  and oral antibiotics.  Felt better for a couple of weeks.  Then became more short of breath.  Consistently wheezy.  Seen by PCP few times for this.  Her PCP says she is always wheezing.  Has been prescribed DuoNebs and albuterol .  Continuously wheeze.  Prescribed Breo but this caused hoarse voice  and sore throat so she stopped taking.  She had repeat imaging 02/02/2023 CT scan the report I can view, no images, reportedly no infiltrate with background emphysematous changes.  She is a former smoker, recently quit.  She was congratulated on this.  Approximately 100-pack-year, up to 2 packs a day for 50 years.    Questionaires / Pulmonary Flowsheets:   ACT:      No data to display          MMRC:     No data to display          Epworth:      No data to display          Tests:   FENO:  No results found for: NITRICOXIDE  PFT:    Latest Ref Rng & Units 05/11/2023   10:52 AM  PFT Results  FVC-Pre L 2.17   FVC-Predicted Pre % 73   FVC-Post L 2.32   FVC-Predicted Post % 78   Pre FEV1/FVC % % 54   Post FEV1/FCV % % 53   FEV1-Pre L 1.16   FEV1-Predicted Pre % 51   FEV1-Post L 1.24   DLCO uncorrected ml/min/mmHg 10.11   DLCO UNC% % 54   DLCO corrected ml/min/mmHg 10.11   DLCO COR %Predicted % 54   DLVA Predicted % 59   TLC L 5.17   TLC % Predicted % 108   RV % Predicted % 144   Personally  reviewed interpreted as moderate COPD, lung volumes with air trapping, moderately reduced DLCO  WALK:     01/15/2024   10:27 AM  SIX MIN WALK  2 Minute Oxygen Saturation % 88 %  2 Minute HR 97  4 Minute Oxygen Saturation % 91 %  4 Minute HR 98  6 Minute Oxygen Saturation % 93 %  6 Minute HR 92    Imaging: Personally reviewed and as per EMR and discussion in this note No results found.  Lab Results: Personally reviewed CBC    Component Value Date/Time   WBC 8.5 12/14/2022 0109   RBC 5.70 (H) 12/14/2022 0109   HGB 16.8 (H) 12/14/2022 0109   HCT 49.8 (H) 12/14/2022 0109   PLT 299 12/14/2022 0109   MCV 87.4 12/14/2022 0109   MCH 29.5 12/14/2022 0109   MCHC 33.7 12/14/2022 0109   RDW 12.7 12/14/2022 0109   LYMPHSABS 0.9 12/14/2022 0109   MONOABS 0.1 12/14/2022 0109   EOSABS 0.0 12/14/2022 0109   BASOSABS 0.0 12/14/2022 0109    BMET    Component  Value Date/Time   NA 132 (L) 12/14/2022 0109   K 4.2 12/14/2022 0109   CL 97 (L) 12/14/2022 0109   CO2 23 12/14/2022 0109   GLUCOSE 157 (H) 12/14/2022 0109   BUN 10 12/14/2022 0109   CREATININE 0.68 12/14/2022 0109   CALCIUM  9.4 12/14/2022 0109   GFRNONAA >60 12/14/2022 0109    BNP    Component Value Date/Time   BNP 42.6 12/10/2022 1801    ProBNP No results found for: PROBNP  Specialty Problems       Pulmonary Problems   CAP (community acquired pneumonia)   COPD, moderate (HCC)   Allergic rhinitis    Allergies  Allergen Reactions   Crestor [Rosuvastatin Calcium ] Diarrhea    Pt states diarrhea for months after taking crestor   Venlafaxine Other (See Comments)    Felt bad on it   Vibegron     Immunization History  Administered Date(s) Administered   Moderna Sars-Covid-2 Vaccination 09/27/2019, 10/25/2019   PFIZER Comirnaty(Gray Top)Covid-19 Tri-Sucrose Vaccine 12/20/2020   PFIZER(Purple Top)SARS-COV-2 Vaccination 06/13/2020    Past Medical History:  Diagnosis Date   Depression    Hypertension     Tobacco History: Social History   Tobacco Use  Smoking Status Every Day   Current packs/day: 1.00   Average packs/day: 1 pack/day for 52.7 years (52.7 ttl pk-yrs)   Types: Cigarettes   Start date: 1973  Smokeless Tobacco Never  Tobacco Comments   10 cigarettes a day or a pack, most days    Ready to quit: Not Answered Counseling given: Not Answered Tobacco comments: 10 cigarettes a day or a pack, most days    Continue to not smoke  Outpatient Encounter Medications as of 04/13/2024  Medication Sig   albuterol  (VENTOLIN  HFA) 108 (90 Base) MCG/ACT inhaler Inhale 2 puffs into the lungs every 4 (four) hours as needed for wheezing or shortness of breath.   azelastine  (ASTELIN ) 0.1 % nasal spray Place 1 spray into both nostrils 2 (two) times daily. Use in each nostril as directed   Biotin 1 MG CAPS Take by mouth.   budeson-glycopyrrolate-formoterol (BREZTRI   AEROSPHERE) 160-9-4.8 MCG/ACT AERO inhaler Inhale 2 puffs into the lungs in the morning and at bedtime.   budeson-glycopyrrolate-formoterol (BREZTRI  AEROSPHERE) 160-9-4.8 MCG/ACT AERO inhaler Inhale 2 puffs into the lungs in the morning and at bedtime.   budesonide  (PULMICORT ) 0.5 MG/2ML nebulizer solution Dissolve 2mL in  sinus rinse bottle, use twice daily.   busPIRone  (BUSPAR ) 10 MG tablet Take 10 mg by mouth 3 (three) times daily.   calcium  carbonate (OS-CAL - DOSED IN MG OF ELEMENTAL CALCIUM ) 1250 (500 Ca) MG tablet Take 1 tablet by mouth.   cholecalciferol  (VITAMIN D3) 25 MCG (1000 UNIT) tablet Take 1,000 Units by mouth once a week.   fluconazole  (DIFLUCAN ) 100 MG tablet Take 200 mg (2 tablets) on day one then 100 mg (1 tab) daily for 6 more days   fluticasone  (FLONASE ) 50 MCG/ACT nasal spray Place 1 spray into both nostrils in the morning and at bedtime.   ipratropium (ATROVENT ) 0.02 % nebulizer solution Take 2.5 mLs (0.5 mg total) by nebulization every 6 (six) hours as needed for wheezing or shortness of breath.   ipratropium (ATROVENT ) 0.06 % nasal spray Use 1 sprays in each nostril up to 4 times daily   ipratropium-albuterol  (DUONEB) 0.5-2.5 (3) MG/3ML SOLN Take 3 mLs by nebulization every 6 (six) hours as needed.   levocetirizine (XYZAL) 5 MG tablet Take 5 mg by mouth every evening.   Mepolizumab  (NUCALA ) 100 MG/ML SOAJ Inject 100 mg into the skin every 28 (twenty-eight) days.   montelukast  (SINGULAIR ) 10 MG tablet Take one tablet once daily   nicotine  (NICODERM CQ  - DOSED IN MG/24 HOURS) 14 mg/24hr patch Place 1 patch (14 mg total) onto the skin daily.   nystatin  (MYCOSTATIN ) 100000 UNIT/ML suspension Take 5 mLs (500,000 Units total) by mouth 4 (four) times daily.   predniSONE  (DELTASONE ) 10 MG tablet 4 tabs for 3 days, then 3 tabs for 3 days, 2 tabs for 3 days, then 1 tab for 3 days, then stop   sertraline  (ZOLOFT ) 100 MG tablet Take 100 mg by mouth daily.   traZODone  (DESYREL ) 50 MG  tablet Take 50 mg by mouth at bedtime.   valsartan -hydrochlorothiazide  (DIOVAN -HCT) 160-12.5 MG tablet Take 1 tablet by mouth daily.   vitamin B-12 (CYANOCOBALAMIN ) 100 MCG tablet Take 100 mcg by mouth daily.   No facility-administered encounter medications on file as of 04/13/2024.     Review of Systems  Review of Systems  N/a Physical Exam  BP 130/72   Pulse 77   Ht 5' 2 (1.575 m)   Wt 214 lb (97.1 kg)   SpO2 93%   BMI 39.14 kg/m   Wt Readings from Last 5 Encounters:  04/13/24 214 lb (97.1 kg)  02/23/24 213 lb 10 oz (96.9 kg)  02/03/24 213 lb 12.8 oz (97 kg)  01/26/24 215 lb 9.8 oz (97.8 kg)  01/22/24 213 lb 8 oz (96.8 kg)    BMI Readings from Last 5 Encounters:  04/13/24 39.14 kg/m  02/23/24 40.36 kg/m  02/03/24 40.40 kg/m  01/26/24 38.81 kg/m  01/22/24 38.43 kg/m     Physical Exam General: Sitting in chair, in no acute distress Eyes: EOMI, no icterus Neck: Supple, no JVP appreciated sitting upright Pulmonary: Clear bilaterally, diminished air movement and diminished sounds throughout Cardiovascular: Warm, no edema Abdomen: Nondistended sounds present MSK: No synovitis, no joint effusion Neuro: Normal gait, no weakness Psych: Normal mood, full affect   Assessment & Plan:    Dyspnea on exertion/wheeze largely due to 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  Former smoker.  PFTs 05/2023 consistent with moderate COPD and air trapping.  Mild improvement symptoms with Breztri .  Unfortunate developed adverse reaction, tongue discomfort tingling current thrush.  Has since resumed with mouthwash prevent thrush.  Continue Breztri .  Discussed Ohtuvayre , consider at next visit.  Continue Nucala .  Nasal/sinus congestion: Persistent for months.  Continue azelastine , ipratropium, Flonase .  Return in about 4 months (around 08/13/2024) for f/u Dr. [name].   Tammy JONELLE [name], MD 04/13/2024

## 2024-04-28 ENCOUNTER — Emergency Department (HOSPITAL_BASED_OUTPATIENT_CLINIC_OR_DEPARTMENT_OTHER)
Admission: EM | Admit: 2024-04-28 | Discharge: 2024-04-28 | Disposition: A | Discharge status: Home / Self Care | Source: Ambulatory Visit | Attending: Emergency Medicine | Admitting: Emergency Medicine

## 2024-04-28 ENCOUNTER — Emergency Department (HOSPITAL_BASED_OUTPATIENT_CLINIC_OR_DEPARTMENT_OTHER)

## 2024-04-28 ENCOUNTER — Other Ambulatory Visit: Payer: Self-pay

## 2024-04-28 ENCOUNTER — Encounter (HOSPITAL_BASED_OUTPATIENT_CLINIC_OR_DEPARTMENT_OTHER): Payer: Self-pay

## 2024-04-28 DIAGNOSIS — R0602 Shortness of breath: Secondary | ICD-10-CM | POA: Diagnosis present

## 2024-04-28 DIAGNOSIS — I10 Essential (primary) hypertension: Secondary | ICD-10-CM | POA: Diagnosis not present

## 2024-04-28 DIAGNOSIS — R059 Cough, unspecified: Secondary | ICD-10-CM | POA: Insufficient documentation

## 2024-04-28 DIAGNOSIS — Z7951 Long term (current) use of inhaled steroids: Secondary | ICD-10-CM | POA: Diagnosis not present

## 2024-04-28 DIAGNOSIS — J441 Chronic obstructive pulmonary disease with (acute) exacerbation: Secondary | ICD-10-CM | POA: Insufficient documentation

## 2024-04-28 DIAGNOSIS — Z79899 Other long term (current) drug therapy: Secondary | ICD-10-CM | POA: Diagnosis not present

## 2024-04-28 HISTORY — DX: Chronic obstructive pulmonary disease, unspecified: J44.9

## 2024-04-28 LAB — CBC WITH DIFFERENTIAL/PLATELET
Abs Immature Granulocytes: 0.09 K/uL — ABNORMAL HIGH (ref 0.00–0.07)
Basophils Absolute: 0 K/uL (ref 0.0–0.1)
Basophils Relative: 0 %
Eosinophils Absolute: 0 K/uL (ref 0.0–0.5)
Eosinophils Relative: 0 %
HCT: 48 % — ABNORMAL HIGH (ref 36.0–46.0)
Hemoglobin: 16.1 g/dL — ABNORMAL HIGH (ref 12.0–15.0)
Immature Granulocytes: 1 %
Lymphocytes Relative: 7 %
Lymphs Abs: 1 K/uL (ref 0.7–4.0)
MCH: 29.8 pg (ref 26.0–34.0)
MCHC: 33.5 g/dL (ref 30.0–36.0)
MCV: 88.7 fL (ref 80.0–100.0)
Monocytes Absolute: 0.4 K/uL (ref 0.1–1.0)
Monocytes Relative: 3 %
Neutro Abs: 11.7 K/uL — ABNORMAL HIGH (ref 1.7–7.7)
Neutrophils Relative %: 89 %
Platelets: 296 K/uL (ref 150–400)
RBC: 5.41 MIL/uL — ABNORMAL HIGH (ref 3.87–5.11)
RDW: 13.2 % (ref 11.5–15.5)
WBC: 13.2 K/uL — ABNORMAL HIGH (ref 4.0–10.5)
nRBC: 0 % (ref 0.0–0.2)

## 2024-04-28 LAB — BASIC METABOLIC PANEL WITH GFR
Anion gap: 15 (ref 5–15)
BUN: 12 mg/dL (ref 8–23)
CO2: 21 mmol/L — ABNORMAL LOW (ref 22–32)
Calcium: 9.7 mg/dL (ref 8.9–10.3)
Chloride: 97 mmol/L — ABNORMAL LOW (ref 98–111)
Creatinine, Ser: 0.72 mg/dL (ref 0.44–1.00)
GFR, Estimated: >60 mL/min (ref >60)
Glucose, Bld: 132 mg/dL — ABNORMAL HIGH (ref 70–99)
Potassium: 4 mmol/L (ref 3.5–5.1)
Sodium: 133 mmol/L — ABNORMAL LOW (ref 135–145)

## 2024-04-28 LAB — RESP PANEL BY RT-PCR (RSV, FLU A&B, COVID)  RVPGX2
Influenza A by PCR: NEGATIVE
Influenza B by PCR: NEGATIVE
Resp Syncytial Virus by PCR: NEGATIVE
SARS Coronavirus 2 by RT PCR: NEGATIVE

## 2024-04-28 MED ORDER — DEXAMETHASONE 4 MG PO TABS: 4.0000 mg | ORAL_TABLET | Freq: Once | ORAL | Status: DC

## 2024-04-28 MED ORDER — METHYLPREDNISOLONE SODIUM SUCC 125 MG IJ SOLR
125.0000 mg | Freq: Once | INTRAMUSCULAR | Status: AC
Start: 2024-04-28 — End: 2024-04-28
  Administered 2024-04-28: 125 mg via INTRAVENOUS
  Filled 2024-04-28: qty 2

## 2024-04-28 MED ORDER — ALBUTEROL SULFATE (2.5 MG/3ML) 0.083% IN NEBU
5.0000 mg | INHALATION_SOLUTION | Freq: Once | RESPIRATORY_TRACT | Status: AC
Start: 2024-04-28 — End: 2024-04-28
  Administered 2024-04-28: 5 mg via RESPIRATORY_TRACT
  Filled 2024-04-28: qty 6

## 2024-04-28 MED ORDER — IPRATROPIUM BROMIDE 0.02 % IN SOLN
0.5000 mg | Freq: Once | RESPIRATORY_TRACT | Status: AC
Start: 2024-04-28 — End: 2024-04-28
  Administered 2024-04-28: 0.5 mg via RESPIRATORY_TRACT
  Filled 2024-04-28: qty 2.5

## 2024-04-28 MED ORDER — IPRATROPIUM-ALBUTEROL 0.5-2.5 (3) MG/3ML IN SOLN
6.0000 mL | Freq: Once | RESPIRATORY_TRACT | Status: AC
  Administered 2024-04-28: 6 mL via RESPIRATORY_TRACT
  Filled 2024-04-28: qty 6

## 2024-04-28 MED ORDER — MAGNESIUM SULFATE 2 GM/50ML IV SOLN
2.0000 g | Freq: Once | INTRAVENOUS | Status: AC
  Administered 2024-04-28: 2 g via INTRAVENOUS
  Filled 2024-04-28: qty 50

## 2024-04-28 NOTE — Progress Notes (Signed)
 Medicare AWV  Tammy Cowan is a 68 y.o. female who presents for her Welcome to Medicare visit.  Clinical documentation was reviewed and is accessible via encounter-level attachments.    Any physical exam components or additional concerns beyond the scope of the Annual Wellness Visit may be documented in a separate note within this encounter.  Medicare Required Components    Past Medical History, Past Surgery History, Allergies, Social History, and Family History were reviewed and updated.    Substance Use Disorder or Overdose Risk Score: 68 (12/25/2023 12:00 AM) Medicare required assessment: Future risk of substance use disorder / overdose risk of 68% is higher (>=20%).    Patient Care Team: [name] [name], NP as PCP - General (Family Medicine) Therisa Massa Black, LCSW as [facility] Therapist (Social Work)  Vitals   Vitals:   04/28/24 1055 04/28/24 1216  BP: 114/64   Patient Position: Sitting   Pulse: 91   Temp: 97 F (36.1 C)   TempSrc: Temporal   Resp: 20   Height: 5' 1 (1.549 m)   Weight: 216 lb 3.2 oz (98.1 kg)   SpO2: 93% 92%  BMI (Calculated): 40.9   PainSc: 0-No pain     Review of Systems is complete and negative except as noted.  BP 114/64 (BP Location: Left Upper Arm, Patient Position: Sitting)   Pulse 91   Temp 97 F (36.1 C) (Temporal)   Resp 20   Ht 5' 1 (1.549 m)   Wt 216 lb 3.2 oz (98.1 kg)   SpO2 92%   BMI 40.85 kg/m  General:  Well Developed, Well Nourished Head, Eyes, Ears, Nose, Throat: Normocephalic, atraumatic, PERRLA, Conjunctiva normal, Bilateral EAC and TM normal, Nares normal, Oropharynx moist and clear. Whisper test passed B/L Neck:  Supple with normal range of motion, No lymphadenopathy  Cardiovascular:  S1S2, regular, rate, and rhythm without murmurs, gallops, or rubs Lungs:  Increased resp effort, wheezing RLL and LLL, no crackles  Abdomen:  Bowel sounds active, soft, non-tender, non-distended, no hepatosplenomegaly or masses Skin:   No Focal Rashes  Extremities:  Mild pedal edema B/L. Dorsalis pedis and posterior tibial pulses 2+ Neurologic:  No focal deficits, Cranial Nerves II-XII intact     Assessment 1. Welcome to Medicare preventive visit   2. Mixed hyperlipidemia   3. Essential hypertension   4. Prediabetes   5. Cigarette smoker   6. Thrush   7. COPD exacerbation (*)   8. Wheezing   9. DOE (dyspnea on exertion)      Plan 1. Health maintenance issues were discussed with the patient.  A written plan was provided to the patient in the form of patient instructions in the After Visit Summary document.  2-3. BP at goal today. Cont medications as prescribed. Labs today: see orders.   4. Last A1C 5.8% 2024. Repeat A1C today.  5. Pt reports she is ready to quit smoking.  Tobacco cessation counseling approx 5-10 minutes. Wellbutrin 150 mg SR initiated.  Discussed taking med 1-2 weeks before stopping smoking. Discussed SE. Rx sent to pharmacy. Follow up in 4 weeks via VV. Encouragement given.  6. Rx for Nystatin  sent to pharmacy. Discussed proper administration of Breztri  and rinsing her mouth out.  7-9. Pt in clinic 2 days ago for COPD exacerbation.  Started on Azithromycin  and oral Prednisone . Reports compliance. Presents today with inc resp effort, wheezing in RLL and LLL. 92% on RA. DuoNeb performed in office, little to no improvement in resp effort. Pt advised  to go to [facility] ER. She verbalizes understanding and is in agreement with plan. She feels safe to drive. [facility] ED charge nurse made aware.      Disposition   1. Welcome to Medicare preventive visit (Primary) -     WELCOME TO MEDICARE EKG; Future 2. Mixed hyperlipidemia -     Lipid Panel; Future 3. Essential hypertension -     CMP14+CBC/D/Plt+TSH; Future 4. Prediabetes -     Hemoglobin A1c; Future 5. Cigarette smoker -     buPROPion (WELLBUTRIN SR) 150 mg 12 hr tablet; Multiple Dosages:Starting Thu  04/28/2024, Until Sat 04/30/2024 at 2359, THEN Starting Sun 05/01/2024, Until Sun 08/28/2024 at 2359Take one tablet (150 mg dose) by mouth daily for 3 days, THEN one tablet (150 mg dose) 2 (two) times daily., Normal 6. Thrush -     nystatin  (MYCOSTATIN ) 100000 UNIT/ML suspension; Take 5 mLs (500,000 Units dose) by mouth 4 (four) times daily. Swish in the mouth and retain as long as possible, then swallow, four times daily for 7 days, Starting Thu 04/28/2024, Normal 7. COPD exacerbation (*) 8. Wheezing 9. DOE (dyspnea on exertion)   Follow up in about 4 weeks (around 05/26/2024) for Med check for Wellbutrin via VV.

## 2024-04-28 NOTE — ED Triage Notes (Signed)
 C/o SHOB since Tuesday. Hx of COPD. Given breathing treatments at PCP but no relief.

## 2024-04-28 NOTE — ED Provider Notes (Signed)
  Physical Exam  BP 124/82 (BP Location: Right Arm)   Pulse 86   Temp 97.8 F (36.6 C) (Oral)   Resp 19   SpO2 93%   Physical Exam  Procedures  Procedures  ED Course / MDM    Medical Decision Making Amount and/or Complexity of Data Reviewed Labs: ordered. Radiology: ordered.  Risk Prescription drug management.   Tammy Tammy, assumed care for this patient.  Patient ambulated in the emergency room with O2 sats between 88 and 90%.  She stated this was about her baseline.  I again offered admission to this patient for further management of her COPD exacerbation, however the patient tells me that she has a husband at home with dementia and a large dog that she needs to take care of.  She has all of her medications at home, including steroids, her inhalers.  She also states that she has some of her husbands oxygen at home and she feels she will use this if she needs to.  I explained return precautions to the patient at bedside.  She assured me she would return to the emergency room if she felt she needed to.  Ultimately, I would prefer to admit the patient for her COPD exacerbation, however the patient does not wish to stay, she is capacity to make this decision.  Her O2 sat were not so bad that I feel shared decision making is more appropriate than an AGAINST MEDICAL ADVICE disposition which might alienate the patient from return visits.       Tammy Fairy T, DO 04/28/24 1712

## 2024-04-28 NOTE — Discharge Instructions (Addendum)
 While you were in the emergency room, you received additional steroids.  Continue taking the steroids that you were prescribed, as well as using your inhalers as instructed.  Return to the emergency room if you develop increased difficulty with your breathing or shortness of breath.

## 2024-04-28 NOTE — ED Notes (Signed)
 Pt ambulated in hallway on room air while measuring SpO2. Pt WOB increased but pt denied increased SHOB. Pt exhibiting pursed lip breathing in hallway, dyspnea with exertion.. SpO2 started at 93% sitting RA, dropped to 88-90% while ambulating. Pt placed back on monitor in stretcher with call bell within reach. Pt in NAD. EDP notified.

## 2024-04-28 NOTE — ED Provider Notes (Signed)
 [place] EMERGENCY DEPARTMENT AT [facility] Provider Note   CSN: [id number] Arrival date & time: 04/28/24  1247     Patient presents with: Shortness of Breath   Tammy Cowan is a 68 y.o. female.   Pt is a 67y/o female with hx of COPD, HTN and depression who is presenting for 1 week of worsening SOB.  Pt states last week she started to get a dry cough and SOB.  She was trying her inhaler at home but it was not helping.  She saw her doctor on Monday and was given a steroid shot and oral steroids and azithromycin .  She also continued her inhaler at  home but has continued to worsen and saw her pt today due to clear sputum and worsening SOB on exertion and was given 2 treatments at the office and sent here.  Pt denies any fever, CP or lower ext swelling.  No known heart hx and denies any abd pain.  No prior hx of PE.  The history is provided by the patient and medical records.  Shortness of Breath      Prior to Admission medications   Medication Sig Start Date End Date Taking? Authorizing Provider  albuterol  (VENTOLIN  HFA) 108 (90 Base) MCG/ACT inhaler Inhale 2 puffs into the lungs every 4 (four) hours as needed for wheezing or shortness of breath. 05/14/23   Hunsucker, Donnice SAUNDERS, MD  azelastine  (ASTELIN ) 0.1 % nasal spray Place 1 spray into both nostrils 2 (two) times daily. Use in each nostril as directed    [provider]  Biotin 1 MG CAPS Take by mouth.    [provider]  budeson-glycopyrrolate-formoterol (BREZTRI  AEROSPHERE) 160-9-4.8 MCG/ACT AERO inhaler Inhale 2 puffs into the lungs in the morning and at bedtime. 12/09/23   Cobb, Comer GAILS, NP  budeson-glycopyrrolate-formoterol (BREZTRI  AEROSPHERE) 160-9-4.8 MCG/ACT AERO inhaler Inhale 2 puffs into the lungs in the morning and at bedtime. 12/09/23   Cobb, Comer GAILS, NP  budesonide  (PULMICORT ) 0.5 MG/2ML nebulizer solution Dissolve 2mL in sinus rinse bottle, use twice daily. 01/22/24   Lorin Norris, MD  busPIRone  (BUSPAR ) 10 MG tablet Take 10 mg by mouth 3 (three) times daily.    [provider]  calcium  carbonate (OS-CAL - DOSED IN MG OF ELEMENTAL CALCIUM ) 1250 (500 Ca) MG tablet Take 1 tablet by mouth.    [provider]  cholecalciferol  (VITAMIN D3) 25 MCG (1000 UNIT) tablet Take 1,000 Units by mouth once a week.    [provider]  fluconazole  (DIFLUCAN ) 100 MG tablet Take 200 mg (2 tablets) on day one then 100 mg (1 tab) daily for 6 more days 02/03/24   Cobb, Comer GAILS, NP  fluticasone  (FLONASE ) 50 MCG/ACT nasal spray Place 1 spray into both nostrils in the morning and at bedtime. 05/14/23   Hunsucker, Donnice SAUNDERS, MD  ipratropium (ATROVENT ) 0.02 % nebulizer solution Take 2.5 mLs (0.5 mg total) by nebulization every 6 (six) hours as needed for wheezing or shortness of breath. 08/12/23   Hunsucker, Donnice SAUNDERS, MD  ipratropium (ATROVENT ) 0.06 % nasal spray Use 1 sprays in each nostril up to 4 times daily 01/22/24   Lorin Norris, MD  ipratropium-albuterol  (DUONEB) 0.5-2.5 (3) MG/3ML SOLN Take 3 mLs by nebulization every 6 (six) hours as needed.    [provider]  levocetirizine (XYZAL) 5 MG tablet Take 5 mg by mouth every evening.    [provider]  Mepolizumab  (NUCALA ) 100 MG/ML SOAJ Inject 100 mg  into the skin every 28 (twenty-eight) days. 01/22/24   [provider]  montelukast  (SINGULAIR ) 10 MG tablet Take one tablet once daily 01/22/24   Lorin Norris, MD  nicotine  (NICODERM CQ  - DOSED IN MG/24 HOURS) 14 mg/24hr patch Place 1 patch (14 mg total) onto the skin daily. 12/14/22   Patsy Lenis, MD  nystatin  (MYCOSTATIN ) 100000 UNIT/ML suspension Take 5 mLs (500,000 Units total) by mouth 4 (four) times daily. 01/11/24   Cobb, Comer GAILS, NP  predniSONE  (DELTASONE ) 10 MG tablet 4 tabs for 3 days, then 3 tabs for 3 days, 2 tabs for 3 days, then 1 tab for 3 days, then stop 01/11/24   Cobb, Comer GAILS, NP  sertraline  (ZOLOFT ) 100 MG  tablet Take 100 mg by mouth daily.    [provider]  traZODone  (DESYREL ) 50 MG tablet Take 50 mg by mouth at bedtime.    [provider]  valsartan -hydrochlorothiazide  (DIOVAN -HCT) 160-12.5 MG tablet Take 1 tablet by mouth daily.    [provider]  vitamin B-12 (CYANOCOBALAMIN ) 100 MCG tablet Take 100 mcg by mouth daily.    [provider]    Allergies: Crestor [rosuvastatin calcium ], Venlafaxine, and Vibegron    Review of Systems  Respiratory:  Positive for shortness of breath.     Updated Vital Signs BP (!) 110/59   Pulse 86   Temp 98.7 F (37.1 C) (Oral)   Resp (!) 21   SpO2 95%   Physical Exam Vitals and nursing note reviewed.  Constitutional:      General: She is not in acute distress.    Appearance: She is well-developed.  HENT:     Head: Normocephalic and atraumatic.  Eyes:     Pupils: Pupils are equal, round, and reactive to light.  Cardiovascular:     Rate and Rhythm: Normal rate and regular rhythm.     Heart sounds: Normal heart sounds. No murmur heard.    No friction rub.  Pulmonary:     Effort: Pulmonary effort is normal. Tachypnea present.     Breath sounds: Wheezing present. No rales.     Comments: Expiratory wheezing in all lung fields worse in the bases Abdominal:     General: Bowel sounds are normal. There is no distension.     Palpations: Abdomen is soft.     Tenderness: There is no abdominal tenderness. There is no guarding or rebound.  Musculoskeletal:        General: No tenderness. Normal range of motion.     Right lower leg: No edema.     Left lower leg: No edema.     Comments: No edema  Skin:    General: Skin is warm and dry.     Findings: No rash.  Neurological:     Mental Status: She is alert and oriented to person, place, and time.     Cranial Nerves: No cranial nerve deficit.  Psychiatric:        Behavior: Behavior normal.     (all labs ordered are listed, but only abnormal results are  displayed) Labs Reviewed  CBC WITH DIFFERENTIAL/PLATELET - Abnormal; Notable for the following components:      Result Value   WBC 13.2 (*)    RBC 5.41 (*)    Hemoglobin 16.1 (*)    HCT 48.0 (*)    Neutro Abs 11.7 (*)    Abs Immature Granulocytes 0.09 (*)    All other components within normal limits  BASIC METABOLIC PANEL WITH  GFR - Abnormal; Notable for the following components:   Sodium 133 (*)    Chloride 97 (*)    CO2 21 (*)    Glucose, Bld 132 (*)    All other components within normal limits  RESP PANEL BY RT-PCR (RSV, FLU A&B, COVID)  RVPGX2    EKG: EKG Interpretation Date/Time:  Thursday April 28 2024 13:00:56 EDT Ventricular Rate:  80 PR Interval:  143 QRS Duration:  98 QT Interval:  395 QTC Calculation: 456 R Axis:   80  Text Interpretation: Sinus rhythm Multiform ventricular premature complexes Low voltage, precordial leads Baseline wander in lead(s) II III aVF V2 V4 V6 No significant change since last tracing Confirmed by Doretha Folks ([id number]) on 04/28/2024 1:15:58 PM  Radiology: [facility] Chest Port 1 View Result Date: 04/28/2024 EXAM: 1 VIEW(S) XRAY OF THE CHEST 04/28/2024 02:16:27 PM COMPARISON: 12/09/2023 CLINICAL HISTORY: sob. COPD exacerbation FINDINGS: LUNGS AND PLEURA: Bibasilar atelectasis. Bilateral coarsened interstitial markings. No pulmonary edema. No pleural effusion. No pneumothorax. HEART AND MEDIASTINUM: Aortic arch calcifications. No acute abnormality of the cardiac and mediastinal silhouettes. BONES AND SOFT TISSUES: No acute osseous abnormality. IMPRESSION: 1. Bibasilar atelectasis. 2. Bilateral coarsened interstitial markings. Electronically signed by: Waddell Calk MD 04/28/2024 02:54 PM EDT RP Workstation: [id number]     Procedures   Medications Ordered in the ED  magnesium  sulfate IVPB 2 g 50 mL (2 g Intravenous New Bag/Given 04/28/24 1456)  ipratropium-albuterol  (DUONEB) 0.5-2.5 (3) MG/3ML nebulizer solution 6 mL (6 mLs Nebulization Given  04/28/24 1257)  albuterol  (PROVENTIL ) (2.5 MG/3ML) 0.083% nebulizer solution 5 mg (5 mg Nebulization Given 04/28/24 1431)  ipratropium (ATROVENT ) nebulizer solution 0.5 mg (0.5 mg Nebulization Given 04/28/24 1432)  methylPREDNISolone  sodium succinate (SOLU-MEDROL ) 125 mg/2 mL injection 125 mg (125 mg Intravenous Given 04/28/24 1453)                                    Medical Decision Making Amount and/or Complexity of Data Reviewed Labs: ordered. Decision-making details documented in ED Course. Radiology: ordered and independent interpretation performed. Decision-making details documented in ED Course. ECG/medicine tests: ordered and independent interpretation performed. Decision-making details documented in ED Course.  Risk Prescription drug management.   Pt with multiple medical problems and comorbidities and presenting today with a complaint that caries a high risk for morbidity and mortality.  Pt here today with SOB.  Concern for COPD exacerbation, viral etiology, pna, anemia, low suspicion for cardiac cause including acs or chf.  Lower suspicion for PE at this time.  Pt given further albuterol /atrovent  here and solumedrol and mag.  I independently reviewed and interpreted pt's labs with mild leukocytosis of 13 most likely from steroids, BMP without acute findings.  EKG without acute findings.  I have independently visualized and interpreted pt's images today.  Cxr without acute findings today.  Pt was placed on O2 due to sats of 89%.  Pt checked out to Dr. Lorren to f/u and re-access.     Final diagnoses:  COPD with acute exacerbation [facility])    ED Discharge Orders     None          Doretha Folks, MD 04/28/24 1542

## 2024-05-13 ENCOUNTER — Inpatient Hospital Stay (HOSPITAL_COMMUNITY)
Admission: EM | Admit: 2024-05-13 | Discharge: 2024-05-20 | DRG: 193 | Disposition: A | Discharge status: Home / Self Care | Attending: Family Medicine | Admitting: Family Medicine

## 2024-05-13 ENCOUNTER — Other Ambulatory Visit: Payer: Self-pay

## 2024-05-13 ENCOUNTER — Encounter (HOSPITAL_COMMUNITY): Payer: Self-pay | Admitting: Emergency Medicine

## 2024-05-13 ENCOUNTER — Emergency Department (HOSPITAL_COMMUNITY)

## 2024-05-13 DIAGNOSIS — J439 Emphysema, unspecified: Secondary | ICD-10-CM

## 2024-05-13 DIAGNOSIS — Z1152 Encounter for screening for COVID-19: Secondary | ICD-10-CM

## 2024-05-13 DIAGNOSIS — Z23 Encounter for immunization: Secondary | ICD-10-CM

## 2024-05-13 DIAGNOSIS — F17210 Nicotine dependence, cigarettes, uncomplicated: Secondary | ICD-10-CM | POA: Diagnosis present

## 2024-05-13 DIAGNOSIS — Z79899 Other long term (current) drug therapy: Secondary | ICD-10-CM

## 2024-05-13 DIAGNOSIS — E785 Hyperlipidemia, unspecified: Secondary | ICD-10-CM | POA: Diagnosis present

## 2024-05-13 DIAGNOSIS — J811 Chronic pulmonary edema: Secondary | ICD-10-CM | POA: Diagnosis present

## 2024-05-13 DIAGNOSIS — J440 Chronic obstructive pulmonary disease with acute lower respiratory infection: Secondary | ICD-10-CM | POA: Diagnosis present

## 2024-05-13 DIAGNOSIS — R7303 Prediabetes: Secondary | ICD-10-CM | POA: Diagnosis present

## 2024-05-13 DIAGNOSIS — J189 Pneumonia, unspecified organism: Secondary | ICD-10-CM | POA: Diagnosis not present

## 2024-05-13 DIAGNOSIS — E669 Obesity, unspecified: Secondary | ICD-10-CM | POA: Diagnosis present

## 2024-05-13 DIAGNOSIS — Z7951 Long term (current) use of inhaled steroids: Secondary | ICD-10-CM

## 2024-05-13 DIAGNOSIS — B9789 Other viral agents as the cause of diseases classified elsewhere: Secondary | ICD-10-CM | POA: Diagnosis present

## 2024-05-13 DIAGNOSIS — E871 Hypo-osmolality and hyponatremia: Secondary | ICD-10-CM | POA: Diagnosis present

## 2024-05-13 DIAGNOSIS — F32A Depression, unspecified: Secondary | ICD-10-CM | POA: Diagnosis present

## 2024-05-13 DIAGNOSIS — I10 Essential (primary) hypertension: Secondary | ICD-10-CM | POA: Diagnosis present

## 2024-05-13 DIAGNOSIS — J441 Chronic obstructive pulmonary disease with (acute) exacerbation: Secondary | ICD-10-CM | POA: Diagnosis present

## 2024-05-13 DIAGNOSIS — R739 Hyperglycemia, unspecified: Secondary | ICD-10-CM

## 2024-05-13 DIAGNOSIS — J9601 Acute respiratory failure with hypoxia: Principal | ICD-10-CM | POA: Diagnosis present

## 2024-05-13 DIAGNOSIS — R0602 Shortness of breath: Secondary | ICD-10-CM | POA: Diagnosis not present

## 2024-05-13 DIAGNOSIS — J188 Other pneumonia, unspecified organism: Secondary | ICD-10-CM | POA: Insufficient documentation

## 2024-05-13 DIAGNOSIS — J309 Allergic rhinitis, unspecified: Secondary | ICD-10-CM | POA: Diagnosis present

## 2024-05-13 DIAGNOSIS — Z888 Allergy status to other drugs, medicaments and biological substances status: Secondary | ICD-10-CM

## 2024-05-13 LAB — CBC WITH DIFFERENTIAL/PLATELET
Abs Immature Granulocytes: 0.15 K/uL — ABNORMAL HIGH (ref 0.00–0.07)
Basophils Absolute: 0 K/uL (ref 0.0–0.1)
Basophils Relative: 0 %
Eosinophils Absolute: 0.1 K/uL (ref 0.0–0.5)
Eosinophils Relative: 0 %
HCT: 44.1 % (ref 36.0–46.0)
Hemoglobin: 14.3 g/dL (ref 12.0–15.0)
Immature Granulocytes: 1 %
Lymphocytes Relative: 9 %
Lymphs Abs: 1.3 K/uL (ref 0.7–4.0)
MCH: 28.9 pg (ref 26.0–34.0)
MCHC: 32.4 g/dL (ref 30.0–36.0)
MCV: 89.3 fL (ref 80.0–100.0)
Monocytes Absolute: 1 K/uL (ref 0.1–1.0)
Monocytes Relative: 7 %
Neutro Abs: 11.8 K/uL — ABNORMAL HIGH (ref 1.7–7.7)
Neutrophils Relative %: 83 %
Platelets: 247 K/uL (ref 150–400)
RBC: 4.94 MIL/uL (ref 3.87–5.11)
RDW: 13.2 % (ref 11.5–15.5)
WBC: 14.3 K/uL — ABNORMAL HIGH (ref 4.0–10.5)
nRBC: 0 % (ref 0.0–0.2)

## 2024-05-13 MED ORDER — IPRATROPIUM BROMIDE 0.02 % IN SOLN
0.5000 mg | Freq: Once | RESPIRATORY_TRACT | Status: AC
  Administered 2024-05-14: 0.5 mg via RESPIRATORY_TRACT
  Filled 2024-05-13: qty 2.5

## 2024-05-13 MED ORDER — ALBUTEROL SULFATE (2.5 MG/3ML) 0.083% IN NEBU
15.0000 mg/h | INHALATION_SOLUTION | Freq: Once | RESPIRATORY_TRACT | Status: AC
  Administered 2024-05-14: 2.5 mg via RESPIRATORY_TRACT
  Filled 2024-05-13: qty 3

## 2024-05-13 NOTE — ED Provider Notes (Signed)
 [place] EMERGENCY DEPARTMENT AT [facility] Provider Note   CSN: [id number] Arrival date & time: 05/13/24  2306     Patient presents with: Shortness of Breath   Tammy Cowan is a 68 y.o. female.   The history is provided by the patient and the EMS personnel.  Shortness of Breath  History of hypertension, COPD who comes in because of increasing cough and shortness of breath over the last 3 days.  Cough was initially productive of white sputum but it is now yellow.  She denies fever but has had chills and sweats.  She is having some soreness in her chest from coughing but denies any true chest pain.  She took a home COVID and influenza test both which were negative.  She has been using her home nebulizer which is giving slight, temporary relief.  She came in by ambulance where she received continuous albuterol  nebulization, intravenous magnesium , intravenous methylprednisolone .  She is a cigarette smoker but states that she stopped this week.  EMS noted initial hypoxia with oxygen saturation 85% on room air.    Prior to Admission medications   Medication Sig Start Date End Date Taking? Authorizing Provider  albuterol  (VENTOLIN  HFA) 108 (90 Base) MCG/ACT inhaler Inhale 2 puffs into the lungs every 4 (four) hours as needed for wheezing or shortness of breath. 05/14/23   Hunsucker, Donnice SAUNDERS, MD  azelastine  (ASTELIN ) 0.1 % nasal spray Place 1 spray into both nostrils 2 (two) times daily. Use in each nostril as directed    [provider]  Biotin 1 MG CAPS Take by mouth.    [provider]  budeson-glycopyrrolate-formoterol (BREZTRI  AEROSPHERE) 160-9-4.8 MCG/ACT AERO inhaler Inhale 2 puffs into the lungs in the morning and at bedtime. 12/09/23   Cobb, Comer GAILS, NP  budeson-glycopyrrolate-formoterol (BREZTRI  AEROSPHERE) 160-9-4.8 MCG/ACT AERO inhaler Inhale 2 puffs into the lungs in the morning and at bedtime. 12/09/23   Cobb, Comer GAILS, NP  budesonide   (PULMICORT ) 0.5 MG/2ML nebulizer solution Dissolve 2mL in sinus rinse bottle, use twice daily. 01/22/24   Lorin Norris, MD  busPIRone  (BUSPAR ) 10 MG tablet Take 10 mg by mouth 3 (three) times daily.    [provider]  calcium  carbonate (OS-CAL - DOSED IN MG OF ELEMENTAL CALCIUM ) 1250 (500 Ca) MG tablet Take 1 tablet by mouth.    [provider]  cholecalciferol  (VITAMIN D3) 25 MCG (1000 UNIT) tablet Take 1,000 Units by mouth once a week.    [provider]  fluconazole  (DIFLUCAN ) 100 MG tablet Take 200 mg (2 tablets) on day one then 100 mg (1 tab) daily for 6 more days 02/03/24   Cobb, Comer GAILS, NP  fluticasone  (FLONASE ) 50 MCG/ACT nasal spray Place 1 spray into both nostrils in the morning and at bedtime. 05/14/23   Hunsucker, Donnice SAUNDERS, MD  ipratropium (ATROVENT ) 0.02 % nebulizer solution Take 2.5 mLs (0.5 mg total) by nebulization every 6 (six) hours as needed for wheezing or shortness of breath. 08/12/23   Hunsucker, Donnice SAUNDERS, MD  ipratropium (ATROVENT ) 0.06 % nasal spray Use 1 sprays in each nostril up to 4 times daily 01/22/24   Lorin Norris, MD  ipratropium-albuterol  (DUONEB) 0.5-2.5 (3) MG/3ML SOLN Take 3 mLs by nebulization every 6 (six) hours as needed.    [provider]  levocetirizine (XYZAL) 5 MG tablet Take 5 mg by mouth every evening.    [provider]  Mepolizumab  (NUCALA ) 100 MG/ML SOAJ Inject 100 mg into the skin  every 28 (twenty-eight) days. 01/22/24   [provider]  montelukast  (SINGULAIR ) 10 MG tablet Take one tablet once daily 01/22/24   Lorin Norris, MD  nicotine  (NICODERM CQ  - DOSED IN MG/24 HOURS) 14 mg/24hr patch Place 1 patch (14 mg total) onto the skin daily. 12/14/22   Patsy Lenis, MD  nystatin  (MYCOSTATIN ) 100000 UNIT/ML suspension Take 5 mLs (500,000 Units total) by mouth 4 (four) times daily. 01/11/24   Cobb, Comer GAILS, NP  predniSONE  (DELTASONE ) 10 MG tablet 4 tabs for 3 days, then 3 tabs for 3 days, 2  tabs for 3 days, then 1 tab for 3 days, then stop 01/11/24   Cobb, Comer GAILS, NP  sertraline  (ZOLOFT ) 100 MG tablet Take 100 mg by mouth daily.    [provider]  traZODone  (DESYREL ) 50 MG tablet Take 50 mg by mouth at bedtime.    [provider]  valsartan -hydrochlorothiazide  (DIOVAN -HCT) 160-12.5 MG tablet Take 1 tablet by mouth daily.    [provider]  vitamin B-12 (CYANOCOBALAMIN ) 100 MCG tablet Take 100 mcg by mouth daily.    [provider]    Allergies: Crestor [rosuvastatin calcium ], Venlafaxine, and Vibegron    Review of Systems  Respiratory:  Positive for shortness of breath.   All other systems reviewed and are negative.   Updated Vital Signs BP 107/70   Pulse 86   Temp 98.6 F (37 C)   Resp 20   SpO2 91%   Physical Exam Vitals and nursing note reviewed.   68 year old female who is mildly dyspneic at rest but not using accessory muscles of respiration.  She is able to speak in complete sentences as long as they are not very long sentences. Vital signs are normal. Oxygen saturation is 91%, which is normal, but only maintained with supplemental oxygen. Head is normocephalic and atraumatic. PERRLA, EOMI.  Neck is nontender and supple. Lungs have diminished airflow with diffuse expiratory wheezes. Chest is nontender. Heart has regular rate and rhythm without murmur. Abdomen is soft, flat, nontender. Extremities have no cyanosis or edema, full range of motion is present. Skin is warm and dry without rash. Neurologic: Mental status is normal, cranial nerves are intact, moves all extremities equally.  (all labs ordered are listed, but only abnormal results are displayed) Labs Reviewed  BASIC METABOLIC PANEL WITH GFR - Abnormal; Notable for the following components:      Result Value   Sodium 127 (*)    Chloride 91 (*)    Glucose, Bld 107 (*)    All other components within normal limits  CBC WITH DIFFERENTIAL/PLATELET - Abnormal;  Notable for the following components:   WBC 14.3 (*)    Neutro Abs 11.8 (*)    Abs Immature Granulocytes 0.15 (*)    All other components within normal limits  RESP PANEL BY RT-PCR (RSV, FLU A&B, COVID)  RVPGX2    EKG: EKG Interpretation Date/Time:  Friday May 13 2024 23:19:06 EDT Ventricular Rate:  87 PR Interval:  154 QRS Duration:  87 QT Interval:  369 QTC Calculation: 444 R Axis:   82  Text Interpretation: Sinus rhythm Borderline right axis deviation Otherwise within normal limits When compared with ECG of 04/28/2024, No significant change was found Confirmed by Raford Lenis ([id number]) on 05/13/2024 11:35:32 PM  Radiology: [facility] Chest Port 1 View Result Date: 05/13/2024 CLINICAL DATA:  Chronic cough and shortness of breath. EXAM: PORTABLE CHEST 1 VIEW COMPARISON:  None 12/22/2023 FINDINGS: Cardiac enlargement. Emphysematous changes  in the lungs. Increasing infiltration in the lung bases may represent multifocal pneumonia or less likely edema. No pleural effusion or pneumothorax. Mediastinal contours appear intact. Calcification of the aorta. IMPRESSION: Increasing infiltrates in the lung bases likely to represent pneumonia. Cardiac enlargement. Emphysematous changes in the lungs. Electronically Signed   By: Elsie Gravely M.D.   On: 05/13/2024 23:59     Procedures  Cardiac monitor shows normal sinus rhythm, per my interpretation. Medications Ordered in the ED  azithromycin  (ZITHROMAX ) 500 mg in sodium chloride  0.9 % 250 mL IVPB (500 mg Intravenous New Bag/Given 05/14/24 0124)  albuterol  (PROVENTIL ) (2.5 MG/3ML) 0.083% nebulizer solution (2.5 mg Nebulization Given 05/14/24 0008)  ipratropium (ATROVENT ) nebulizer solution 0.5 mg (0.5 mg Nebulization Given 05/14/24 0008)  cefTRIAXone  (ROCEPHIN ) 2 g in sodium chloride  0.9 % 100 mL IVPB (2 g Intravenous New Bag/Given 05/14/24 0123)  albuterol  (PROVENTIL ) (2.5 MG/3ML) 0.083% nebulizer solution (20 mg/hr Nebulization Given 05/14/24  0148)                                    Medical Decision Making Amount and/or Complexity of Data Reviewed Labs: ordered. Radiology: ordered.  Risk Prescription drug management. Decision regarding hospitalization.   Shortness of breath with cough.  This is a presentation with a wide range of treatment options and carries with it a high risk of morbidity and complications.  Differential diagnosis includes, but is not limited to, COPD exacerbation, community-acquired pneumonia, acute bronchitis.  I have ordered chest x-ray, screening labs, respiratory pathogen PCR panel.  I have reviewed her past records, and note ED visit on 04/28/2024 for COPD exacerbation, hospitalization on 12/10/2022 for community-acquired pneumonia.  I have reviewed her electrocardiogram, and my interpretation is sinus rhythm with borderline right axis deviation and unchanged from prior.  Chest x-ray shows bibasilar infiltrates concerning for pneumonia.  I have independently viewed the image, and agree with radiologist's interpretation.  I reviewed her laboratory tests, my interpretation is negative PCR for COVID-19 and influenza and RSV, mild leukocytosis with report of increased absolute immature granulocytes, hyponatremia which appears to be chronic and is not felt to be clinically significant, mildly elevated random glucose level.  She continued to have significant wheezing and continued to be dyspneic in spite of albuterol .  I feel she will need to be admitted.  At this time she does not appear to be tiring but at some point she may need BiPAP.  I have ordered additional albuterol  with ipratropium.  I have discussed case with Dr. Melrose Saba of [facility], who agrees to admit the patient.     Final diagnoses:  Acute hypoxemic respiratory failure (HCC)  Community acquired pneumonia of left lung, unspecified part of lung  COPD exacerbation (HCC)  Hyponatremia  Elevated random blood glucose level    ED  Discharge Orders     None          Raford Lenis, MD 05/14/24 [phone]

## 2024-05-13 NOTE — ED Triage Notes (Signed)
 Pt arrives W/ [facility] from home w/ c/o [facility] since Tuesday. Worsening tonight. Hx asthma. Wheezing noted. 85% RA. 2 duonebs given en route. 15 albuterol  1 of Atrovent . 125 solumedrol, 2g mag given en route.  20 G L Hand

## 2024-05-14 ENCOUNTER — Inpatient Hospital Stay (HOSPITAL_COMMUNITY)

## 2024-05-14 DIAGNOSIS — B9789 Other viral agents as the cause of diseases classified elsewhere: Secondary | ICD-10-CM | POA: Diagnosis present

## 2024-05-14 DIAGNOSIS — J441 Chronic obstructive pulmonary disease with (acute) exacerbation: Secondary | ICD-10-CM | POA: Diagnosis present

## 2024-05-14 DIAGNOSIS — J440 Chronic obstructive pulmonary disease with acute lower respiratory infection: Secondary | ICD-10-CM | POA: Diagnosis present

## 2024-05-14 DIAGNOSIS — R0602 Shortness of breath: Secondary | ICD-10-CM | POA: Diagnosis present

## 2024-05-14 DIAGNOSIS — F32A Depression, unspecified: Secondary | ICD-10-CM | POA: Diagnosis present

## 2024-05-14 DIAGNOSIS — J309 Allergic rhinitis, unspecified: Secondary | ICD-10-CM | POA: Diagnosis present

## 2024-05-14 DIAGNOSIS — E669 Obesity, unspecified: Secondary | ICD-10-CM | POA: Diagnosis present

## 2024-05-14 DIAGNOSIS — E785 Hyperlipidemia, unspecified: Secondary | ICD-10-CM | POA: Diagnosis present

## 2024-05-14 DIAGNOSIS — Z79899 Other long term (current) drug therapy: Secondary | ICD-10-CM | POA: Diagnosis not present

## 2024-05-14 DIAGNOSIS — I5021 Acute systolic (congestive) heart failure: Secondary | ICD-10-CM | POA: Diagnosis not present

## 2024-05-14 DIAGNOSIS — Z1152 Encounter for screening for COVID-19: Secondary | ICD-10-CM | POA: Diagnosis not present

## 2024-05-14 DIAGNOSIS — F17210 Nicotine dependence, cigarettes, uncomplicated: Secondary | ICD-10-CM | POA: Diagnosis present

## 2024-05-14 DIAGNOSIS — J9601 Acute respiratory failure with hypoxia: Secondary | ICD-10-CM | POA: Diagnosis present

## 2024-05-14 DIAGNOSIS — Z888 Allergy status to other drugs, medicaments and biological substances status: Secondary | ICD-10-CM | POA: Diagnosis not present

## 2024-05-14 DIAGNOSIS — Z23 Encounter for immunization: Secondary | ICD-10-CM | POA: Diagnosis present

## 2024-05-14 DIAGNOSIS — J811 Chronic pulmonary edema: Secondary | ICD-10-CM | POA: Diagnosis present

## 2024-05-14 DIAGNOSIS — Z7951 Long term (current) use of inhaled steroids: Secondary | ICD-10-CM | POA: Diagnosis not present

## 2024-05-14 DIAGNOSIS — R7303 Prediabetes: Secondary | ICD-10-CM | POA: Diagnosis present

## 2024-05-14 DIAGNOSIS — J189 Pneumonia, unspecified organism: Secondary | ICD-10-CM | POA: Diagnosis present

## 2024-05-14 DIAGNOSIS — E871 Hypo-osmolality and hyponatremia: Secondary | ICD-10-CM | POA: Diagnosis present

## 2024-05-14 DIAGNOSIS — I10 Essential (primary) hypertension: Secondary | ICD-10-CM | POA: Diagnosis present

## 2024-05-14 LAB — COMPREHENSIVE METABOLIC PANEL WITH GFR
ALT: 13 U/L (ref 0–44)
AST: 16 U/L (ref 15–41)
Albumin: 3.6 g/dL (ref 3.5–5.0)
Alkaline Phosphatase: 73 U/L (ref 38–126)
Anion gap: 12 (ref 5–15)
BUN: 14 mg/dL (ref 8–23)
CO2: 24 mmol/L (ref 22–32)
Calcium: 9.5 mg/dL (ref 8.9–10.3)
Chloride: 92 mmol/L — ABNORMAL LOW (ref 98–111)
Creatinine, Ser: 0.57 mg/dL (ref 0.44–1.00)
GFR, Estimated: >60 mL/min (ref >60)
Glucose, Bld: 149 mg/dL — ABNORMAL HIGH (ref 70–99)
Potassium: 4.3 mmol/L (ref 3.5–5.1)
Sodium: 128 mmol/L — ABNORMAL LOW (ref 135–145)
Total Bilirubin: 0.4 mg/dL (ref 0.0–1.2)
Total Protein: 6.9 g/dL (ref 6.5–8.1)

## 2024-05-14 LAB — PRO BRAIN NATRIURETIC PEPTIDE: Pro Brain Natriuretic Peptide: 468 pg/mL — ABNORMAL HIGH (ref <300.0)

## 2024-05-14 LAB — RESP PANEL BY RT-PCR (RSV, FLU A&B, COVID)  RVPGX2
Influenza A by PCR: NEGATIVE
Influenza B by PCR: NEGATIVE
Resp Syncytial Virus by PCR: NEGATIVE
SARS Coronavirus 2 by RT PCR: NEGATIVE

## 2024-05-14 LAB — BASIC METABOLIC PANEL WITH GFR
Anion gap: 11 (ref 5–15)
BUN: 13 mg/dL (ref 8–23)
CO2: 26 mmol/L (ref 22–32)
Calcium: 9.6 mg/dL (ref 8.9–10.3)
Chloride: 91 mmol/L — ABNORMAL LOW (ref 98–111)
Creatinine, Ser: 0.56 mg/dL (ref 0.44–1.00)
GFR, Estimated: >60 mL/min (ref >60)
Glucose, Bld: 107 mg/dL — ABNORMAL HIGH (ref 70–99)
Potassium: 4 mmol/L (ref 3.5–5.1)
Sodium: 127 mmol/L — ABNORMAL LOW (ref 135–145)

## 2024-05-14 LAB — HEMOGLOBIN A1C
Hgb A1c MFr Bld: 5.6 % (ref 4.8–5.6)
Mean Plasma Glucose: 114.02 mg/dL

## 2024-05-14 LAB — MRSA NEXT GEN BY PCR, NASAL: MRSA by PCR Next Gen: NOT DETECTED

## 2024-05-14 LAB — HIV ANTIBODY (ROUTINE TESTING W REFLEX): HIV Screen 4th Generation wRfx: NONREACTIVE

## 2024-05-14 LAB — OSMOLALITY: Osmolality: 277 mosm/kg (ref 275–295)

## 2024-05-14 MED ORDER — VALSARTAN-HYDROCHLOROTHIAZIDE 160-12.5 MG PO TABS: 0.5000 | ORAL_TABLET | Freq: Every day | ORAL | Status: DC

## 2024-05-14 MED ORDER — SODIUM CHLORIDE 0.9 % IV SOLN
2.0000 g | Freq: Once | INTRAVENOUS | Status: AC
  Administered 2024-05-14: 2 g via INTRAVENOUS
  Filled 2024-05-14: qty 20

## 2024-05-14 MED ORDER — ENOXAPARIN SODIUM 40 MG/0.4ML IJ SOSY
40.0000 mg | PREFILLED_SYRINGE | INTRAMUSCULAR | Status: DC
  Administered 2024-05-14 – 2024-05-20 (×7): 40 mg via SUBCUTANEOUS
  Filled 2024-05-14 (×7): qty 0.4

## 2024-05-14 MED ORDER — SODIUM CHLORIDE 0.9 % IV SOLN
500.0000 mg | INTRAVENOUS | Status: DC
  Administered 2024-05-14 – 2024-05-16 (×3): 500 mg via INTRAVENOUS
  Filled 2024-05-14 (×3): qty 5

## 2024-05-14 MED ORDER — MONTELUKAST SODIUM 10 MG PO TABS
10.0000 mg | ORAL_TABLET | Freq: Every day | ORAL | Status: AC
Start: 2024-05-14 — End: ?
  Administered 2024-05-14 – 2024-05-20 (×7): 10 mg via ORAL
  Filled 2024-05-14 (×7): qty 1

## 2024-05-14 MED ORDER — EZETIMIBE 10 MG PO TABS
10.0000 mg | ORAL_TABLET | Freq: Every day | ORAL | Status: DC
  Administered 2024-05-14 – 2024-05-20 (×7): 10 mg via ORAL
  Filled 2024-05-14 (×7): qty 1

## 2024-05-14 MED ORDER — ORAL CARE MOUTH RINSE: 15.0000 mL | OROMUCOSAL | Status: DC | PRN

## 2024-05-14 MED ORDER — HYDROCHLOROTHIAZIDE 12.5 MG PO TABS
6.2500 mg | ORAL_TABLET | Freq: Every day | ORAL | Status: DC
  Administered 2024-05-14 – 2024-05-20 (×7): 6.25 mg via ORAL
  Filled 2024-05-14 (×7): qty 1

## 2024-05-14 MED ORDER — IRBESARTAN 75 MG PO TABS
75.0000 mg | ORAL_TABLET | Freq: Every day | ORAL | Status: DC
  Administered 2024-05-14 – 2024-05-20 (×7): 75 mg via ORAL
  Filled 2024-05-14 (×7): qty 1

## 2024-05-14 MED ORDER — GUAIFENESIN ER 600 MG PO TB12
600.0000 mg | ORAL_TABLET | Freq: Two times a day (BID) | ORAL | Status: DC
  Administered 2024-05-14 – 2024-05-20 (×13): 600 mg via ORAL
  Filled 2024-05-14 (×13): qty 1

## 2024-05-14 MED ORDER — FUROSEMIDE 10 MG/ML IJ SOLN
40.0000 mg | Freq: Once | INTRAMUSCULAR | Status: AC
  Administered 2024-05-14: 40 mg via INTRAVENOUS
  Filled 2024-05-14: qty 4

## 2024-05-14 MED ORDER — SODIUM CHLORIDE 0.9 % IV SOLN
2.0000 g | INTRAVENOUS | Status: AC
  Administered 2024-05-15 – 2024-05-19 (×5): 2 g via INTRAVENOUS
  Filled 2024-05-14 (×5): qty 20

## 2024-05-14 MED ORDER — INFLUENZA VAC SPLIT HIGH-DOSE 0.5 ML IM SUSY: 0.5000 mL | PREFILLED_SYRINGE | INTRAMUSCULAR | Status: DC | PRN

## 2024-05-14 MED ORDER — BUSPIRONE HCL 10 MG PO TABS
10.0000 mg | ORAL_TABLET | Freq: Three times a day (TID) | ORAL | Status: DC
  Administered 2024-05-14 – 2024-05-20 (×19): 10 mg via ORAL
  Filled 2024-05-14 (×19): qty 1

## 2024-05-14 MED ORDER — LORATADINE 10 MG PO TABS
10.0000 mg | ORAL_TABLET | Freq: Every day | ORAL | Status: DC
  Administered 2024-05-14 – 2024-05-20 (×7): 10 mg via ORAL
  Filled 2024-05-14 (×7): qty 1

## 2024-05-14 MED ORDER — ALBUTEROL SULFATE (2.5 MG/3ML) 0.083% IN NEBU
20.0000 mg/h | INHALATION_SOLUTION | Freq: Once | RESPIRATORY_TRACT | Status: AC
Start: 2024-05-14 — End: 2024-05-14
  Administered 2024-05-14: 20 mg/h via RESPIRATORY_TRACT
  Filled 2024-05-14: qty 24
  Filled 2024-05-14: qty 3

## 2024-05-14 MED ORDER — FLUTICASONE PROPIONATE 50 MCG/ACT NA SUSP
1.0000 | Freq: Two times a day (BID) | NASAL | Status: DC
  Administered 2024-05-14 – 2024-05-20 (×13): 1 via NASAL
  Filled 2024-05-14: qty 16

## 2024-05-14 MED ORDER — BUPROPION HCL ER (SR) 150 MG PO TB12
150.0000 mg | ORAL_TABLET | Freq: Two times a day (BID) | ORAL | Status: DC
  Administered 2024-05-14 – 2024-05-20 (×13): 150 mg via ORAL
  Filled 2024-05-14 (×13): qty 1

## 2024-05-14 MED ORDER — TRAZODONE HCL 50 MG PO TABS
50.0000 mg | ORAL_TABLET | Freq: Every evening | ORAL | Status: DC | PRN
  Administered 2024-05-14 – 2024-05-19 (×6): 50 mg via ORAL
  Filled 2024-05-14 (×6): qty 1

## 2024-05-14 MED ORDER — VITAMIN D (ERGOCALCIFEROL) 1.25 MG (50000 UNIT) PO CAPS
50000.0000 [IU] | ORAL_CAPSULE | ORAL | Status: DC
  Administered 2024-05-20: 50000 [IU] via ORAL
  Filled 2024-05-14: qty 1

## 2024-05-14 MED ORDER — CHLORHEXIDINE GLUCONATE CLOTH 2 % EX PADS
6.0000 | MEDICATED_PAD | Freq: Every day | CUTANEOUS | Status: DC
  Administered 2024-05-14 – 2024-05-20 (×7): 6 via TOPICAL

## 2024-05-14 MED ORDER — BUDESONIDE 0.5 MG/2ML IN SUSP
0.5000 mg | Freq: Two times a day (BID) | RESPIRATORY_TRACT | Status: AC
Start: 2024-05-14 — End: ?
  Administered 2024-05-14 – 2024-05-20 (×12): 0.5 mg via RESPIRATORY_TRACT
  Filled 2024-05-14 (×13): qty 2

## 2024-05-14 MED ORDER — IPRATROPIUM-ALBUTEROL 0.5-2.5 (3) MG/3ML IN SOLN
3.0000 mL | Freq: Four times a day (QID) | RESPIRATORY_TRACT | Status: DC
  Administered 2024-05-14 – 2024-05-20 (×24): 3 mL via RESPIRATORY_TRACT
  Filled 2024-05-14 (×26): qty 3

## 2024-05-14 MED ORDER — LEVOCETIRIZINE DIHYDROCHLORIDE 5 MG PO TABS: 5.0000 mg | ORAL_TABLET | Freq: Every evening | ORAL | Status: DC

## 2024-05-14 MED ORDER — SODIUM CHLORIDE 0.9 % IV SOLN
500.0000 mg | Freq: Once | INTRAVENOUS | Status: AC
  Administered 2024-05-14: 500 mg via INTRAVENOUS
  Filled 2024-05-14: qty 5

## 2024-05-14 MED ORDER — SERTRALINE HCL 100 MG PO TABS
200.0000 mg | ORAL_TABLET | Freq: Every day | ORAL | Status: DC
  Administered 2024-05-14 – 2024-05-20 (×7): 200 mg via ORAL
  Filled 2024-05-14 (×7): qty 2

## 2024-05-14 MED ORDER — METHYLPREDNISOLONE SODIUM SUCC 125 MG IJ SOLR
60.0000 mg | Freq: Two times a day (BID) | INTRAMUSCULAR | Status: DC
  Administered 2024-05-14 – 2024-05-19 (×11): 60 mg via INTRAVENOUS
  Filled 2024-05-14 (×11): qty 2

## 2024-05-14 MED ORDER — ALBUTEROL SULFATE (2.5 MG/3ML) 0.083% IN NEBU
2.5000 mg | INHALATION_SOLUTION | RESPIRATORY_TRACT | Status: DC | PRN
  Filled 2024-05-14: qty 3

## 2024-05-14 NOTE — Plan of Care (Signed)
  Problem: Education: Goal: Knowledge of disease or condition will improve Outcome: Progressing Goal: Knowledge of the prescribed therapeutic regimen will improve Outcome: Progressing   Problem: Respiratory: Goal: Ability to maintain a clear airway will improve Outcome: Progressing Goal: Ability to maintain adequate ventilation will improve Outcome: Progressing   Problem: Respiratory: Goal: Ability to maintain adequate ventilation will improve Outcome: Progressing Goal: Ability to maintain a clear airway will improve Outcome: Progressing

## 2024-05-14 NOTE — Progress Notes (Addendum)
 Assisted with transporting PT from [facility] ED to [facility] ICU while trailing off [facility] (on 15 LPM Salter).  PT tolerated well, continues to be on Salter at 15 lpm- 94% (Sp02 goal >92%). ICU RT aware PT has arrived.  Safety valve on Salter passed.

## 2024-05-14 NOTE — Progress Notes (Signed)
 Patient insisted that she be able to ambulate to bathroom with out her oxygen,states she has been doing that all day, however when patient ambulate with oxygen her saturations drop into the 70's and she experiences server dyspnea, patient educated on the need to use bed side commode and why it was important due to her oxygen levels, charge nurse and respiratory therapist also spoke with patient, however patient declined and continued to get up and ambulate without oxygen.

## 2024-05-14 NOTE — Progress Notes (Addendum)
 Tammy Cowan is a 68 y.o. 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  She has been started on bronchodilators, Rocephin , Zithromax  as well as Solu-Medrol .  Patient seen and examined in the ICU/stepdown unit.  Patient on 30 L with FiO2 60% currently however she says that she is feeling much better.  Patient is able to speak in full sentences.  On lung examination, she has diffuse inspiratory as well as expiratory wheezes and bilateral rhonchi.  Rest of the physical exam benign.  I have reviewed patient's H&P, labs and vitals in detail.  Continue current management plan.  Multiple labs pending.  Sodium slightly low but appears to be very close to her baseline.  Respiratory viral panel is also pending.  Also her BNP is elevated.  Prior BNP in 2024 was within normal range.  No echo in the chart.  I will give her a dose of Lasix 40 mg IV and check echo.  Total time spent 39 minutes of critical care

## 2024-05-14 NOTE — ED Notes (Signed)
 Patient self removed IV fluids connected and infusing at this time.

## 2024-05-14 NOTE — Plan of Care (Signed)
 Patient continues to ambulate to bathroom without oxygen and continues to experience dyspnea, education provided, plan of care and goals reviewed with time given for questions, patient hand book is at bedside, patient remains on 15 liters high flow nasal cannula. Bed in lowest locked position with side rails up, bedside commode in reach, call bell in reach, bed alarm on with mat on flow, patient instructed to use call bell before trying to get up and to wait for staff due to sider ails being up and bed alarm being on, patient states she understands.  Problem: Education: Goal: Knowledge of disease or condition will improve Outcome: Progressing Goal: Knowledge of the prescribed therapeutic regimen will improve Outcome: Progressing Goal: Individualized Educational Video(s) Outcome: Progressing   Problem: Education: Goal: Knowledge of the prescribed therapeutic regimen will improve Outcome: Progressing   Problem: Activity: Goal: Ability to tolerate increased activity will improve Outcome: Progressing Goal: Will verbalize the importance of balancing activity with adequate rest periods Outcome: Progressing   Problem: Respiratory: Goal: Ability to maintain a clear airway will improve Outcome: Progressing Goal: Levels of oxygenation will improve Outcome: Progressing Goal: Ability to maintain adequate ventilation will improve Outcome: Progressing   Problem: Activity: Goal: Ability to tolerate increased activity will improve Outcome: Progressing   Problem: Respiratory: Goal: Ability to maintain adequate ventilation will improve Outcome: Progressing Goal: Ability to maintain a clear airway will improve Outcome: Progressing   Problem: Education: Goal: Knowledge of General Education information will improve Description: Including pain rating scale, medication(s)/side effects and non-pharmacologic comfort measures Outcome: Progressing   Problem: Clinical Measurements: Goal: Ability to  maintain clinical measurements within normal limits will improve Outcome: Progressing Goal: Will remain free from infection Outcome: Progressing Goal: Diagnostic test results will improve Outcome: Progressing Goal: Respiratory complications will improve Outcome: Progressing Goal: Cardiovascular complication will be avoided Outcome: Progressing   Problem: Safety: Goal: Ability to remain free from injury will improve Outcome: Progressing

## 2024-05-14 NOTE — H&P (Signed)
 History and Physical    Tammy Cowan FMW:[id number] DOB: 28-Nov-1955 DOA: 05/13/2024  PCP: [name], [name], NP  Patient coming from: Home  Chief Complaint: Shortness of breath  HPI: Tammy Cowan is a 68 y.o. 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  Oxygen saturation 85% on room air initially.  Patient was given 2 DuoNebs, albuterol , Atrovent , IV mag 2 g, and Solu-Medrol  125 mg by EMS.  In the ED, patient was placed heated high flow nasal cannula.  Afebrile.  Labs notable for WBC count 14.3, sodium 127 (chronically in the 128-134 range), chloride 91, COVID/influenza/RSV PCR negative.  Chest x-ray showing increasing infiltrates in the lung bases suspicious for multifocal pneumonia or less likely edema.  EKG showing sinus rhythm and no acute ischemic changes.  Patient was given albuterol , ipratropium, ceftriaxone , and azithromycin  in the ED.  [facility] called to admit.  Patient is reporting 1 week history of progressively worsening dyspnea, cough productive of yellow sputum, and wheezing.  Also endorsing rhinorrhea.  She manages apartment complexes for the elderly and is not sure if she was exposed to anyone who is sick.  States she was prescribed prednisone  by her doctor which she has taken for the past 3 days and also using her home inhalers but her symptoms did not improve prompting her to come into the ED to be evaluated.  She is not on home oxygen.  Denies fevers or chest pain.  She reports significant improvement of her dyspnea after receiving breathing treatments and being placed on heated high flow nasal cannula in the ED.  Patient states she was previously smoking 1-1.5 packs of cigarettes daily but was recently prescribed Wellbutrin by her doctor and has not  smoked any cigarettes for the past 1 week.  She receives monthly Nucala  injections.  Review of Systems:  Review of Systems  All other systems reviewed and are negative.   Past Medical History:  Diagnosis Date   COPD (chronic obstructive pulmonary disease) (HCC)    Depression    Hypertension     Past Surgical History:  Procedure Laterality Date   CARPAL TUNNEL RELEASE Right 05/10/2020   Procedure: CARPAL TUNNEL RELEASE;  Surgeon: Murrell Drivers, MD;  Location: [facility];  Service: Orthopedics;  Laterality: Right;  block in preop   CARPAL TUNNEL RELEASE Left 07/19/2020   Procedure: CARPAL TUNNEL RELEASE;  Surgeon: Murrell Drivers, MD;  Location: [facility];  Service: Orthopedics;  Laterality: Left;   CHOLECYSTECTOMY     CYST EXCISION Right 05/10/2020   Procedure: RIGHT LUNATE CURRETAGE CYST AND BONE GRAFTING  ALLOGRAFT;  Surgeon: Murrell Drivers, MD;  Location: [facility];  Service: Orthopedics;  Laterality: Right;  block in preop   TONSILLECTOMY     TRIGGER FINGER RELEASE Left 07/19/2020   Procedure: RELEASE TRIGGER FINGER/A-1 PULLEY LEFT SMALL FINGER;  Surgeon: Murrell Drivers, MD;  Location: [facility];  Service: Orthopedics;  Laterality: Left;     reports that she has been smoking cigarettes. She started smoking about 52 years ago. She has a 52.8 pack-year smoking history. She has never used smokeless tobacco. She reports that she does not currently use alcohol. She reports that she does not use drugs.  Allergies  Allergen Reactions   Crestor [Rosuvastatin Calcium ] Diarrhea    Pt states diarrhea  for months after taking crestor   Venlafaxine Other (See Comments)    Felt bad on it   Vibegron     History reviewed. No pertinent family history.  Prior to Admission medications   Medication Sig Start Date End Date Taking? Authorizing Provider  albuterol  (VENTOLIN  HFA) 108 (90 Base) MCG/ACT inhaler Inhale 2 puffs into the lungs  every 4 (four) hours as needed for wheezing or shortness of breath. 05/14/23  Yes Hunsucker, Donnice SAUNDERS, MD  budeson-glycopyrrolate-formoterol (BREZTRI  AEROSPHERE) 160-9-4.8 MCG/ACT AERO inhaler Inhale 2 puffs into the lungs in the morning and at bedtime. 12/09/23  Yes Cobb, Comer GAILS, NP  budesonide  (PULMICORT ) 0.5 MG/2ML nebulizer solution Dissolve 2mL in sinus rinse bottle, use twice daily. 01/22/24  Yes Lorin Norris, MD  buPROPion (WELLBUTRIN SR) 150 MG 12 hr tablet Take 150 mg by mouth 2 (two) times daily.   Yes [provider]  busPIRone  (BUSPAR ) 10 MG tablet Take 10 mg by mouth 3 (three) times daily.   Yes [provider]  ezetimibe (ZETIA) 10 MG tablet Take 10 mg by mouth daily. 04/16/24  Yes [provider]  fluticasone  (FLONASE ) 50 MCG/ACT nasal spray Place 1 spray into both nostrils in the morning and at bedtime. 05/14/23  Yes Hunsucker, Donnice SAUNDERS, MD  ipratropium (ATROVENT ) 0.02 % nebulizer solution Take 2.5 mLs (0.5 mg total) by nebulization every 6 (six) hours as needed for wheezing or shortness of breath. 08/12/23  Yes Hunsucker, Donnice SAUNDERS, MD  ipratropium (ATROVENT ) 0.06 % nasal spray Use 1 sprays in each nostril up to 4 times daily 01/22/24  Yes Lorin Norris, MD  ipratropium-albuterol  (DUONEB) 0.5-2.5 (3) MG/3ML SOLN Take 3 mLs by nebulization every 6 (six) hours as needed.   Yes [provider]  levocetirizine (XYZAL) 5 MG tablet Take 5 mg by mouth every evening.   Yes [provider]  Mepolizumab  (NUCALA ) 100 MG/ML SOAJ Inject 100 mg into the skin every 28 (twenty-eight) days. 01/22/24  Yes [provider]  montelukast  (SINGULAIR ) 10 MG tablet Take one tablet once daily 01/22/24  Yes Lorin Norris, MD  nystatin  (MYCOSTATIN ) 100000 UNIT/ML suspension Take 5 mLs (500,000 Units total) by mouth 4 (four) times daily. 01/11/24  Yes Cobb, Comer GAILS, NP  sertraline  (ZOLOFT ) 100 MG tablet Take 200 mg by mouth daily.   Yes [provider]  traZODone  (DESYREL ) 50 MG tablet Take 50 mg by mouth at bedtime as needed.   Yes [provider]  valsartan -hydrochlorothiazide  (DIOVAN -HCT) 160-12.5 MG tablet Take 0.5 tablets by mouth daily.   Yes [provider]  Vitamin D , Ergocalciferol , (DRISDOL) 1.25 MG (50000 UNIT) CAPS capsule Take 50,000 Units by mouth every 7 (seven) days.   Yes [provider]  budeson-glycopyrrolate-formoterol (BREZTRI  AEROSPHERE) 160-9-4.8 MCG/ACT AERO inhaler Inhale 2 puffs into the lungs in the morning and at bedtime. Patient not taking: Reported on 05/14/2024 12/09/23   Malachy Comer GAILS, NP  fluconazole  (DIFLUCAN ) 100 MG tablet Take 200 mg (2 tablets) on day one then 100 mg (1 tab) daily for 6 more days Patient not taking: Reported on 05/14/2024 02/03/24   Malachy Comer GAILS, NP  nicotine  (NICODERM CQ  - DOSED IN MG/24 HOURS) 14 mg/24hr patch Place 1 patch (14 mg total) onto the skin daily. Patient not taking: Reported on 05/14/2024 12/14/22   Patsy Lenis, MD  predniSONE  (DELTASONE ) 10 MG tablet 4 tabs for 3 days, then 3 tabs for 3 days, 2 tabs for 3 days, then 1 tab for 3  days, then stop Patient not taking: Reported on 05/14/2024 01/11/24   Malachy Comer GAILS, NP    Physical Exam: Vitals:   05/14/24 0245 05/14/24 0250 05/14/24 0251 05/14/24 0358  BP:    108/75  Pulse: 91 91 93 89  Resp: 19 16 19 20   Temp:    98 F (36.7 C)  TempSrc:    Oral  SpO2: 93% 96% 95% 92%    Physical Exam Vitals reviewed.  Constitutional:      General: She is not in acute distress. HENT:     Head: Normocephalic and atraumatic.  Eyes:     Extraocular Movements: Extraocular movements intact.  Cardiovascular:     Rate and Rhythm: Normal rate and regular rhythm.     Heart sounds: Normal heart sounds.  Pulmonary:     Effort: Pulmonary effort is normal. No respiratory distress.     Breath sounds: Wheezing present.  Abdominal:     General: Bowel sounds are normal.     Palpations:  Abdomen is soft.     Tenderness: There is no abdominal tenderness. There is no guarding.  Musculoskeletal:     Cervical back: Normal range of motion.     Right lower leg: No edema.     Left lower leg: No edema.  Skin:    General: Skin is warm and dry.  Neurological:     General: No focal deficit present.     Mental Status: She is alert and oriented to person, place, and time.     Labs on Admission: I have personally reviewed following labs and imaging studies  CBC: Recent Labs  Lab 05/13/24 2347  WBC 14.3*  NEUTROABS 11.8*  HGB 14.3  HCT 44.1  MCV 89.3  PLT 247   Basic Metabolic Panel: Recent Labs  Lab 05/13/24 2347  NA 127*  K 4.0  CL 91*  CO2 26  GLUCOSE 107*  BUN 13  CREATININE 0.56  CALCIUM  9.6   GFR: CrCl cannot be calculated (Unknown ideal weight.). Liver Function Tests: No results for input(s): AST, ALT, ALKPHOS, BILITOT, PROT, ALBUMIN in the last 168 hours. No results for input(s): LIPASE, AMYLASE in the last 168 hours. No results for input(s): AMMONIA in the last 168 hours. Coagulation Profile: No results for input(s): INR, PROTIME in the last 168 hours. Cardiac Enzymes: No results for input(s): CKTOTAL, CKMB, CKMBINDEX, TROPONINI in the last 168 hours. BNP (last 3 results) No results for input(s): PROBNP in the last 8760 hours. HbA1C: No results for input(s): HGBA1C in the last 72 hours. CBG: No results for input(s): GLUCAP in the last 168 hours. Lipid Profile: No results for input(s): CHOL, HDL, LDLCALC, TRIG, CHOLHDL, LDLDIRECT in the last 72 hours. Thyroid Function Tests: No results for input(s): TSH, T4TOTAL, FREET4, T3FREE, THYROIDAB in the last 72 hours. Anemia Panel: No results for input(s): VITAMINB12, FOLATE, FERRITIN, TIBC, IRON, RETICCTPCT in the last 72 hours. Urine analysis: No results found for: COLORURINE, APPEARANCEUR, LABSPEC, PHURINE, GLUCOSEU,  HGBUR, BILIRUBINUR, KETONESUR, PROTEINUR, UROBILINOGEN, NITRITE, LEUKOCYTESUR  Radiological Exams on Admission: DG Chest Port 1 View Result Date: 05/13/2024 CLINICAL DATA:  Chronic cough and shortness of breath. EXAM: PORTABLE CHEST 1 VIEW COMPARISON:  None 12/22/2023 FINDINGS: Cardiac enlargement. Emphysematous changes in the lungs. Increasing infiltration in the lung bases may represent multifocal pneumonia or less likely edema. No pleural effusion or pneumothorax. Mediastinal contours appear intact. Calcification of the aorta. IMPRESSION: Increasing infiltrates in the lung bases likely to represent pneumonia. Cardiac enlargement. Emphysematous changes  in the lungs. Electronically Signed   By: Elsie Gravely M.D.   On: 05/13/2024 23:59    Assessment and Plan  Acute hypoxemic respiratory failure secondary to acute exacerbation of COPD-asthma overlap syndrome in the setting of multifocal pneumonia Oxygen saturation 85% on room air.  Currently stable on 15 L heated high flow nasal cannula.  Continues to have wheezing on exam but overall patient reports significant improvement of her dyspnea since arrival to the ED.  She is able to speak in complete sentences.  Chest x-ray showing increasing infiltrates in the lung bases suspicious for multifocal pneumonia or less likely edema.  No documented history of CHF or previous echo results in the chart.  She does not appear volume overloaded on exam.  Does have mild leukocytosis on labs.  COVID/influenza/RSV PCR negative. -Solu-Medrol  60 mg every 12 hours -DuoNeb every 6 hours -Pulmicort  neb twice daily -Albuterol  neb every 2 hours PRN -Patient already received IV mag 2 g by EMS -Ceftriaxone  and azithromycin  -Mucinex  -MRSA PCR screen -Sputum Gram stain and culture -Strep pneumo/Legionella urinary antigens -Full 20 pathogen respiratory viral panel -BNP -Incentive spirometry, flutter valve -Continue supplemental oxygen, wean as  tolerated  Hypertension Stable.  Continue valsartan  and hydrochlorothiazide .  Hyperlipidemia Continue Zetia.  History of prediabetes No previous hemoglobin A1c results in the chart, check now.  Tobacco abuse Patient states she has not smoked any cigarettes for the past 1 week.  Continue Wellbutrin.  Depression Continue Wellbutrin, BuSpar , and Zoloft .  Chronic mild hyponatremia Sodium 127 (previously in the 128-134 range).  Will hold off giving IV fluids until BNP is checked as above.  Check serum osmolarity and monitor sodium level.  Chronic allergic rhinitis Continue Flonase  nasal spray, Xyzal, and Singulair .  DVT prophylaxis: Lovenox  Code Status: Full Code (discussed with the patient) Level of care: Step Down Unit Admission status: It is my clinical opinion that admission to INPATIENT is reasonable and necessary because of the expectation that this patient will require hospital care that crosses at least 2 midnights to treat this condition based on the medical complexity of the problems presented.  Given the aforementioned information, the predictability of an adverse outcome is felt to be significant.  Editha Ram MD [facility]  If 7PM-7AM, please contact night-coverage [email or web address]  05/14/2024, 5:32 AM

## 2024-05-15 ENCOUNTER — Inpatient Hospital Stay (HOSPITAL_COMMUNITY)

## 2024-05-15 DIAGNOSIS — J441 Chronic obstructive pulmonary disease with (acute) exacerbation: Secondary | ICD-10-CM | POA: Diagnosis not present

## 2024-05-15 DIAGNOSIS — I5021 Acute systolic (congestive) heart failure: Secondary | ICD-10-CM

## 2024-05-15 LAB — BASIC METABOLIC PANEL WITH GFR
Anion gap: 10 (ref 5–15)
BUN: 20 mg/dL (ref 8–23)
CO2: 27 mmol/L (ref 22–32)
Calcium: 9.3 mg/dL (ref 8.9–10.3)
Chloride: 94 mmol/L — ABNORMAL LOW (ref 98–111)
Creatinine, Ser: 0.49 mg/dL (ref 0.44–1.00)
GFR, Estimated: >60 mL/min (ref >60)
Glucose, Bld: 143 mg/dL — ABNORMAL HIGH (ref 70–99)
Potassium: 4.5 mmol/L (ref 3.5–5.1)
Sodium: 131 mmol/L — ABNORMAL LOW (ref 135–145)

## 2024-05-15 LAB — CBC WITH DIFFERENTIAL/PLATELET
Abs Immature Granulocytes: 0.17 K/uL — ABNORMAL HIGH (ref 0.00–0.07)
Basophils Absolute: 0 K/uL (ref 0.0–0.1)
Basophils Relative: 0 %
Eosinophils Absolute: 0 K/uL (ref 0.0–0.5)
Eosinophils Relative: 0 %
HCT: 42.5 % (ref 36.0–46.0)
Hemoglobin: 13.5 g/dL (ref 12.0–15.0)
Immature Granulocytes: 1 %
Lymphocytes Relative: 5 %
Lymphs Abs: 0.8 K/uL (ref 0.7–4.0)
MCH: 28.8 pg (ref 26.0–34.0)
MCHC: 31.8 g/dL (ref 30.0–36.0)
MCV: 90.6 fL (ref 80.0–100.0)
Monocytes Absolute: 0.6 K/uL (ref 0.1–1.0)
Monocytes Relative: 3 %
Neutro Abs: 15.3 K/uL — ABNORMAL HIGH (ref 1.7–7.7)
Neutrophils Relative %: 91 %
Platelets: 282 K/uL (ref 150–400)
RBC: 4.69 MIL/uL (ref 3.87–5.11)
RDW: 12.9 % (ref 11.5–15.5)
WBC: 16.8 K/uL — ABNORMAL HIGH (ref 4.0–10.5)
nRBC: 0 % (ref 0.0–0.2)

## 2024-05-15 LAB — ECHOCARDIOGRAM COMPLETE
AR max vel: 2.62 cm2
AV Peak grad: 5.8 mmHg
Ao pk vel: 1.2 m/s
Area-P 1/2: 3.77 cm2
Calc EF: 54.1 %
Height: 61 in
S' Lateral: 3.5 cm
Single Plane A2C EF: 54.4 %
Single Plane A4C EF: 55.9 %
Weight: 3449.76 [oz_av]

## 2024-05-15 LAB — RESPIRATORY PANEL BY PCR

## 2024-05-15 LAB — EXPECTORATED SPUTUM ASSESSMENT W GRAM STAIN, RFLX TO RESP C

## 2024-05-15 MED ORDER — MELATONIN 5 MG PO TABS
5.0000 mg | ORAL_TABLET | Freq: Once | ORAL | Status: AC
  Administered 2024-05-16: 5 mg via ORAL
  Filled 2024-05-15: qty 1

## 2024-05-15 MED ORDER — PERFLUTREN LIPID MICROSPHERE
1.0000 mL | INTRAVENOUS | Status: AC | PRN
  Administered 2024-05-15: 2.5 mL via INTRAVENOUS

## 2024-05-15 MED ORDER — INFLUENZA VAC SPLIT HIGH-DOSE 0.5 ML IM SUSY
0.5000 mL | PREFILLED_SYRINGE | INTRAMUSCULAR | Status: AC | PRN
  Administered 2024-05-20: 0.5 mL via INTRAMUSCULAR
  Filled 2024-05-15: qty 0.5

## 2024-05-15 NOTE — Progress Notes (Signed)
 Echocardiogram 2D Echocardiogram has been performed.  Rashia Mckesson N Benno Brensinger,RDCS 05/15/2024, 10:25 AM

## 2024-05-15 NOTE — Plan of Care (Signed)
 Patient on HHFNC at this time, oxygen is at 40 liters and 60% FIO2, patient able to sleep throughout night with the aid of the PRN medication for sleep, patient up x 1 to use the bedside commode.  Problem: Education: Goal: Knowledge of disease or condition will improve 05/15/2024 0448 by Arrie Luke NOVAK, RN Outcome: Progressing 05/14/2024 2307 by Arrie Luke NOVAK, RN Outcome: Progressing Goal: Knowledge of the prescribed therapeutic regimen will improve 05/15/2024 0448 by Arrie Luke NOVAK, RN Outcome: Progressing 05/14/2024 2307 by Arrie Luke NOVAK, RN Outcome: Progressing Goal: Individualized Educational Video(s) 05/15/2024 0448 by Arrie Luke NOVAK, RN Outcome: Progressing 05/14/2024 2307 by Arrie Luke NOVAK, RN Outcome: Progressing   Problem: Activity: Goal: Ability to tolerate increased activity will improve 05/15/2024 0448 by Arrie Luke NOVAK, RN Outcome: Progressing 05/14/2024 2307 by Arrie Luke NOVAK, RN Outcome: Progressing Goal: Will verbalize the importance of balancing activity with adequate rest periods 05/15/2024 0448 by Arrie Luke NOVAK, RN Outcome: Progressing 05/14/2024 2307 by Arrie Luke NOVAK, RN Outcome: Progressing   Problem: Respiratory: Goal: Ability to maintain a clear airway will improve 05/15/2024 0448 by Arrie Luke NOVAK, RN Outcome: Progressing 05/14/2024 2307 by Arrie Luke NOVAK, RN Outcome: Progressing Goal: Levels of oxygenation will improve 05/15/2024 0448 by Arrie Luke NOVAK, RN Outcome: Progressing 05/14/2024 2307 by Arrie Luke NOVAK, RN Outcome: Progressing Goal: Ability to maintain adequate ventilation will improve 05/15/2024 0448 by Arrie Luke NOVAK, RN Outcome: Progressing 05/14/2024 2307 by Arrie Luke NOVAK, RN Outcome: Progressing

## 2024-05-15 NOTE — Progress Notes (Signed)
 PROGRESS NOTE    ELLIE BRYAND  FMW:[id number] DOB: [birth year] DOA: 05/13/2024 PCP: Wallie Drafts, NP   Brief Narrative:  Tammy Cowan is a 68 y.o. 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    Assessment & Plan:   Principal Problem:   COPD exacerbation (HCC) Active Problems:   Acute hypoxemic respiratory failure (HCC)   Multifocal pneumonia   Depression   Essential hypertension  Acute hypoxemic respiratory failure secondary to acute exacerbation of COPD-asthma overlap syndrome in the setting of multifocal pneumonia  Chest x-ray showing increasing infiltrates in the lung bases suspicious for multifocal pneumonia or less likely edema.  COVID/influenza/RSV PCR negative.  RVP ordered but is still pending.  Patient clinically feeling better but requiring more oxygen.  No wheezes on examination.  Cultures are negative so far.  Continue following.  Low threshold to consult critical care if she were to require more oxygen. -Solu-Medrol  60 mg every 12 hours -DuoNeb every 6 hours -Pulmicort  neb twice daily -Albuterol  neb every 2 hours PRN -Ceftriaxone  and azithromycin  -Mucinex  -Incentive spirometry, flutter valve -Continue supplemental oxygen, wean as tolerated  Pulmonary edema/possible congestive heart failure, type unknown: Chest x-ray with some suspicion of possible pulmonary edema.  BNP elevated.  No history of heart failure.  No echo in the chart.  Gave her a dose of Lasix yesterday.  Will repeat Lasix 1 dose today.  Echo pending.   Hypertension Stable.  Continue valsartan  and  hydrochlorothiazide .   Hyperlipidemia Continue Zetia.   History of prediabetes hemoglobin A1c this hospitalization is only 5.6.   Tobacco abuse Patient states she has not smoked any cigarettes for the past 1 week.  Continue Wellbutrin.   Depression Continue Wellbutrin, BuSpar , and Zoloft .   Chronic mild hyponatremia Sodium 127 (previously in the 128-134 range).  131 today.   Chronic allergic rhinitis Continue Flonase  nasal spray, Xyzal, and Singulair .  DVT prophylaxis: enoxaparin  (LOVENOX ) injection 40 mg Start: 05/14/24 1000   Code Status: Full Code  Family Communication:  None present at bedside.  Plan of care discussed with patient in length and he/she verbalized understanding and agreed with it.  Status is: Inpatient Remains inpatient appropriate because: Requiring high amount of oxygen   Estimated body mass index is 40.74 kg/m as calculated from the following:   Height as of this encounter: 5' 1 (1.549 m).   Weight as of this encounter: 97.8 kg.    Nutritional Assessment: Body mass index is 40.74 kg/m.SABRA Seen by dietician.  I agree with the assessment and plan as outlined below: Nutrition Status:        . Skin Assessment: I have examined the patient's skin and I agree with the wound assessment as performed by the wound care RN as outlined below:    Consultants:  None  Procedures:  None  Antimicrobials:  Anti-infectives (From admission, onward)    Start     Dose/Rate Route Frequency Ordered Stop   05/15/24 0000  cefTRIAXone  (ROCEPHIN ) 2 g in sodium chloride  0.9 % 100 mL IVPB        2 g 200 mL/hr over 30 Minutes Intravenous Every 24 hours 05/14/24 0628     05/14/24 2200  azithromycin  (  ZITHROMAX ) 500 mg in sodium chloride  0.9 % 250 mL IVPB        500 mg 250 mL/hr over 60 Minutes Intravenous Every 24 hours 05/14/24 0628     05/14/24 0115  cefTRIAXone  (ROCEPHIN ) 2 g in sodium chloride  0.9 % 100 mL IVPB        2 g 200 mL/hr over 30 Minutes Intravenous   Once 05/14/24 0108 05/14/24 0251   05/14/24 0115  azithromycin  (ZITHROMAX ) 500 mg in sodium chloride  0.9 % 250 mL IVPB        500 mg 250 mL/hr over 60 Minutes Intravenous  Once 05/14/24 0108 05/14/24 0224         Subjective: Patient seen and examined, she feels the same like yesterday, has some shortness of breath with activity but otherwise feels well at rest.  Required more oxygen than yesterday.  Still able to speak in full sentences.  Objective: Vitals:   05/15/24 0300 05/15/24 0400 05/15/24 0438 05/15/24 0500  BP: 105/68  (!) 164/83 (!) 101/54  Pulse: 85  77 71  Resp:   19 13  Temp:  98 F (36.7 C)    TempSrc:  Axillary    SpO2: 90%  91% 96%  Weight:   97.8 kg   Height:        Intake/Output Summary (Last 24 hours) at 05/15/2024 0738 Last data filed at 05/15/2024 0440 Gross per 24 hour  Intake 589.79 ml  Output 300 ml  Net 289.79 ml   Filed Weights   05/14/24 0900 05/14/24 1612 05/15/24 0438  Weight: 97.1 kg 95.1 kg 97.8 kg    Examination:  General exam: Appears calm and comfortable  Respiratory system: Diffuse inspiratory and expiratory wheezes with faint rhonchi bilaterally Cardiovascular system: S1 & S2 heard, RRR. No JVD, murmurs, rubs, gallops or clicks. No pedal edema. Gastrointestinal system: Abdomen is nondistended, soft and nontender. No organomegaly or masses felt. Normal bowel sounds heard. Central nervous system: Alert and oriented. No focal neurological deficits. Extremities: Symmetric 5 x 5 power. Skin: No rashes, lesions or ulcers Psychiatry: Judgement and insight appear normal. Mood & affect appropriate.    Data Reviewed: I have personally reviewed following labs and imaging studies  CBC: Recent Labs  Lab 05/13/24 2347 05/15/24 0245  WBC 14.3* 16.8*  NEUTROABS 11.8* 15.3*  HGB 14.3 13.5  HCT 44.1 42.5  MCV 89.3 90.6  PLT 247 282   Basic Metabolic Panel: Recent Labs  Lab 05/13/24 2347 05/14/24 0905 05/15/24 0245  NA 127* 128*  131*  K 4.0 4.3 4.5  CL 91* 92* 94*  CO2 26 24 27   GLUCOSE 107* 149* 143*  BUN 13 14 20   CREATININE 0.56 0.57 0.49  CALCIUM  9.6 9.5 9.3   GFR: Estimated Creatinine Clearance: 75 mL/min (by C-G formula based on SCr of 0.49 mg/dL). Liver Function Tests: Recent Labs  Lab 05/14/24 0905  AST 16  ALT 13  ALKPHOS 73  BILITOT 0.4  PROT 6.9  ALBUMIN 3.6   No results for input(s): LIPASE, AMYLASE in the last 168 hours. No results for input(s): AMMONIA in the last 168 hours. Coagulation Profile: No results for input(s): INR, PROTIME in the last 168 hours. Cardiac Enzymes: No results for input(s): CKTOTAL, CKMB, CKMBINDEX, TROPONINI in the last 168 hours. BNP (last 3 results) Recent Labs    05/14/24 0905  PROBNP 468.0*   HbA1C: Recent Labs    05/14/24 0905  HGBA1C 5.6   CBG: No results for input(s): GLUCAP in  the last 168 hours. Lipid Profile: No results for input(s): CHOL, HDL, LDLCALC, TRIG, CHOLHDL, LDLDIRECT in the last 72 hours. Thyroid Function Tests: No results for input(s): TSH, T4TOTAL, FREET4, T3FREE, THYROIDAB in the last 72 hours. Anemia Panel: No results for input(s): VITAMINB12, FOLATE, FERRITIN, TIBC, IRON, RETICCTPCT in the last 72 hours. Sepsis Labs: No results for input(s): PROCALCITON, LATICACIDVEN in the last 168 hours.  Recent Results (from the past 240 hours)  Resp panel by RT-PCR (RSV, Flu A&B, Covid) Anterior Nasal Swab     Status: None   Collection Time: 05/13/24  1:20 AM   Specimen: Anterior Nasal Swab  Result Value Ref Range Status   SARS Coronavirus 2 by RT PCR NEGATIVE NEGATIVE Final    Comment: (NOTE) SARS-CoV-2 target nucleic acids are NOT DETECTED.  The SARS-CoV-2 RNA is generally detectable in upper respiratory specimens during the acute phase of infection. The lowest concentration of SARS-CoV-2 viral copies this assay can detect is 138 copies/mL. A negative result does not  preclude SARS-Cov-2 infection and should not be used as the sole basis for treatment or other patient management decisions. A negative result may occur with  improper specimen collection/handling, submission of specimen other than nasopharyngeal swab, presence of viral mutation(s) within the areas targeted by this assay, and inadequate number of viral copies(<138 copies/mL). A negative result must be combined with clinical observations, patient history, and epidemiological information. The expected result is Negative.  Fact Sheet for Patients:  BloggerCourse.com  Fact Sheet for Healthcare Providers:  SeriousBroker.it  This test is no t yet approved or cleared by the [place]  FDA and  has been authorized for detection and/or diagnosis of SARS-CoV-2 by FDA under an Emergency Use Authorization (EUA). This EUA will remain  in effect (meaning this test can be used) for the duration of the COVID-19 declaration under Section 564(b)(1) of the Act, 21 U.S.C.section 360bbb-3(b)(1), unless the authorization is terminated  or revoked sooner.       Influenza A by PCR NEGATIVE NEGATIVE Final   Influenza B by PCR NEGATIVE NEGATIVE Final    Comment: (NOTE) The Xpert Xpress SARS-CoV-2/FLU/RSV plus assay is intended as an aid in the diagnosis of influenza from Nasopharyngeal swab specimens and should not be used as a sole basis for treatment. Nasal washings and aspirates are unacceptable for Xpert Xpress SARS-CoV-2/FLU/RSV testing.  Fact Sheet for Patients: BloggerCourse.com  Fact Sheet for Healthcare Providers: SeriousBroker.it  This test is not yet approved or cleared by the [place]  FDA and has been authorized for detection and/or diagnosis of SARS-CoV-2 by FDA under an Emergency Use Authorization (EUA). This EUA will remain in effect (meaning this test can be used) for the  duration of the COVID-19 declaration under Section 564(b)(1) of the Act, 21 U.S.C. section 360bbb-3(b)(1), unless the authorization is terminated or revoked.     Resp Syncytial Virus by PCR NEGATIVE NEGATIVE Final    Comment: (NOTE) Fact Sheet for Patients: BloggerCourse.com  Fact Sheet for Healthcare Providers: SeriousBroker.it  This test is not yet approved or cleared by the [place]  FDA and has been authorized for detection and/or diagnosis of SARS-CoV-2 by FDA under an Emergency Use Authorization (EUA). This EUA will remain in effect (meaning this test can be used) for the duration of the COVID-19 declaration under Section 564(b)(1) of the Act, 21 U.S.C. section 360bbb-3(b)(1), unless the authorization is terminated or revoked.  Performed at [facility], [address] [place], [place] [address]   MRSA Next  Gen by PCR, Nasal     Status: None   Collection Time: 05/14/24  8:59 AM   Specimen: Nasal Mucosa; Nasal Swab  Result Value Ref Range Status   MRSA by PCR Next Gen NOT DETECTED NOT DETECTED Final    Comment: (NOTE) The GeneXpert MRSA Assay (FDA approved for NASAL specimens only), is one component of a comprehensive MRSA colonization surveillance program. It is not intended to diagnose MRSA infection nor to guide or monitor treatment for MRSA infections. Test performance is not FDA approved in patients less than 45 years old. Performed at [facility], [address], [place], [place] [address]      Radiology Studies: [facility] Chest Port 1 View Result Date: 05/13/2024 CLINICAL DATA:  Chronic cough and shortness of breath. EXAM: PORTABLE CHEST 1 VIEW COMPARISON:  None 12/22/2023 FINDINGS: Cardiac enlargement. Emphysematous changes in the lungs. Increasing infiltration in the lung bases may represent multifocal pneumonia or less likely edema. No pleural effusion or pneumothorax.  Mediastinal contours appear intact. Calcification of the aorta. IMPRESSION: Increasing infiltrates in the lung bases likely to represent pneumonia. Cardiac enlargement. Emphysematous changes in the lungs. Electronically Signed   By: Elsie Gravely M.D.   On: 05/13/2024 23:59    Scheduled Meds:  budesonide   0.5 mg Nebulization BID   buPROPion  150 mg Oral BID   busPIRone   10 mg Oral TID   Chlorhexidine Gluconate Cloth  6 each Topical Daily   enoxaparin  (LOVENOX ) injection  40 mg Subcutaneous Q24H   ezetimibe  10 mg Oral Daily   fluticasone   1 spray Each Nare BID   guaiFENesin   600 mg Oral BID   irbesartan   75 mg Oral Daily   And   hydrochlorothiazide   6.25 mg Oral Daily   ipratropium-albuterol   3 mL Nebulization Q6H   loratadine  10 mg Oral Daily   methylPREDNISolone  (SOLU-MEDROL ) injection  60 mg Intravenous Q12H   montelukast   10 mg Oral Daily   sertraline   200 mg Oral Daily   [START ON 05/20/2024] Vitamin D  (Ergocalciferol )  50,000 Units Oral Q7 days   Continuous Infusions:  azithromycin  (ZITHROMAX ) 500 mg in sodium chloride  0.9 % 250 mL IVPB Stopped (05/14/24 2343)   cefTRIAXone  (ROCEPHIN )  IV Stopped (05/15/24 0032)     LOS: 1 day   Fredia Skeeter, MD [facility]  05/15/2024, 7:38 AM   *Please note that this is a verbal dictation therefore any spelling or grammatical errors are due to the Dragon Medical One system interpretation.  Please page via Amion and do not message via secure chat for urgent patient care matters. Secure chat can be used for non urgent patient care matters.  How to contact the [facility] Attending or Consulting provider 7A - 7P or covering provider during after hours 7P -7A, for this patient?  Check the care team in [facility] and look for a) attending/consulting [facility] provider listed and b) the [facility] team listed. Page or secure chat 7A-7P. Log into [email or web address] and use [place]'s universal password to access. If you do not have the password, please  contact the hospital operator. Locate the [facility] provider you are looking for under [facility] and page to a number that you can be directly reached. If you still have difficulty reaching the provider, please page the [facility] (Director on Call) for the Hospitalists listed on amion for assistance.

## 2024-05-16 ENCOUNTER — Inpatient Hospital Stay (HOSPITAL_COMMUNITY)

## 2024-05-16 DIAGNOSIS — J441 Chronic obstructive pulmonary disease with (acute) exacerbation: Secondary | ICD-10-CM | POA: Diagnosis not present

## 2024-05-16 LAB — BASIC METABOLIC PANEL WITH GFR
Anion gap: 8 (ref 5–15)
BUN: 22 mg/dL (ref 8–23)
CO2: 29 mmol/L (ref 22–32)
Calcium: 9.3 mg/dL (ref 8.9–10.3)
Chloride: 93 mmol/L — ABNORMAL LOW (ref 98–111)
Creatinine, Ser: 0.55 mg/dL (ref 0.44–1.00)
GFR, Estimated: >60 mL/min (ref >60)
Glucose, Bld: 146 mg/dL — ABNORMAL HIGH (ref 70–99)
Potassium: 5 mmol/L (ref 3.5–5.1)
Sodium: 130 mmol/L — ABNORMAL LOW (ref 135–145)

## 2024-05-16 LAB — CBC
HCT: 44.3 % (ref 36.0–46.0)
Hemoglobin: 13.7 g/dL (ref 12.0–15.0)
MCH: 28.5 pg (ref 26.0–34.0)
MCHC: 30.9 g/dL (ref 30.0–36.0)
MCV: 92.1 fL (ref 80.0–100.0)
Platelets: 288 K/uL (ref 150–400)
RBC: 4.81 MIL/uL (ref 3.87–5.11)
RDW: 13 % (ref 11.5–15.5)
WBC: 12.5 K/uL — ABNORMAL HIGH (ref 4.0–10.5)
nRBC: 0 % (ref 0.0–0.2)

## 2024-05-16 MED ORDER — BENZONATATE 100 MG PO CAPS
100.0000 mg | ORAL_CAPSULE | Freq: Once | ORAL | Status: AC
  Administered 2024-05-16: 100 mg via ORAL
  Filled 2024-05-16: qty 1

## 2024-05-16 MED ORDER — ACETAMINOPHEN 325 MG PO TABS
650.0000 mg | ORAL_TABLET | Freq: Four times a day (QID) | ORAL | Status: DC | PRN
  Administered 2024-05-16 – 2024-05-18 (×3): 650 mg via ORAL
  Filled 2024-05-16 (×3): qty 2

## 2024-05-16 NOTE — Plan of Care (Signed)
  Problem: Respiratory: Goal: Ability to maintain a clear airway will improve Outcome: Not Progressing Goal: Levels of oxygenation will improve Outcome: Not Progressing   Problem: Respiratory: Goal: Ability to maintain adequate ventilation will improve Outcome: Not Progressing Goal: Ability to maintain a clear airway will improve Outcome: Not Progressing

## 2024-05-16 NOTE — Progress Notes (Signed)
 PROGRESS NOTE    Tammy Cowan  FMW:[id number] DOB: 07-26-1956 DOA: 05/13/2024 PCP: Wallie Drafts, NP   Brief Narrative:  Tammy Cowan is a 68 y.o. 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    Assessment & Plan:   Principal Problem:   COPD exacerbation (HCC) Active Problems:   Acute hypoxemic respiratory failure (HCC)   Multifocal pneumonia   Depression   Essential hypertension  Acute hypoxemic respiratory failure secondary to acute exacerbation of COPD-asthma overlap syndrome in the setting of multifocal pneumonia  Chest x-ray showing increasing infiltrates in the lung bases suspicious for multifocal pneumonia or less likely edema.  COVID/influenza/RSV PCR negative.  RVP ordered but is still pending.  Patient clinically feeling better but requiring more oxygen.  No wheezes on examination.  Cultures are negative so far.  Continue following.  Repeat chest x-ray today shows significant improvement.  Low threshold to consult critical care if she were to decline. -Solu-Medrol  60 mg every 12 hours -DuoNeb every 6 hours -Pulmicort  neb twice daily -Albuterol  neb every 2 hours PRN -Ceftriaxone  and azithromycin  -Mucinex  -Incentive spirometry, flutter valve -Continue supplemental oxygen, wean as tolerated  Pulmonary edema: Chest x-ray with some suspicion of possible pulmonary edema.  BNP elevated.  No history of heart failure.  Echo this admission with normal EF and no diastolic dysfunction.  No significant valvular heart disease.  Received 2 doses of Lasix for last 2 days.  Repeat x-ray today   is clear without any edema or infiltrates.   Hypertension Stable.  Continue valsartan  and hydrochlorothiazide .   Hyperlipidemia Continue Zetia.   History of prediabetes hemoglobin A1c this hospitalization is only 5.6.   Tobacco abuse Patient states she has not smoked any cigarettes for the past 1 week.  Continue Wellbutrin.   Depression Continue Wellbutrin, BuSpar , and Zoloft .   Chronic mild hyponatremia Sodium 127 (previously in the 128-134 range).  131 today.   Chronic allergic rhinitis Continue Flonase  nasal spray, Xyzal, and Singulair .  DVT prophylaxis: enoxaparin  (LOVENOX ) injection 40 mg Start: 05/14/24 1000   Code Status: Full Code  Family Communication:  None present at bedside.  Plan of care discussed with patient in length and he/she verbalized understanding and agreed with it.  Status is: Inpatient Remains inpatient appropriate because: Requiring high amount of oxygen   Estimated body mass index is 40.74 kg/m as calculated from the following:   Height as of this encounter: 5' 1 (1.549 m).   Weight as of this encounter: 97.8 kg.    Nutritional Assessment: Body mass index is 40.74 kg/m.Tammy Cowan Seen by dietician.  I agree with the assessment and plan as outlined below: Nutrition Status:        . Skin Assessment: I have examined the patient's skin and I agree with the wound assessment as performed by the wound care RN as outlined below:    Consultants:  None  Procedures:  None  Antimicrobials:  Anti-infectives (From admission, onward)    Start     Dose/Rate Route Frequency Ordered Stop   05/15/24 0000  cefTRIAXone  (ROCEPHIN ) 2 g in sodium chloride  0.9 % 100 mL IVPB        2 g 200 mL/hr over  30 Minutes Intravenous Every 24 hours 05/14/24 0628     05/14/24 2200  azithromycin  (ZITHROMAX ) 500 mg in sodium chloride  0.9 % 250 mL IVPB        500 mg 250 mL/hr over 60 Minutes Intravenous Every 24 hours 05/14/24 0628     05/14/24 0115  cefTRIAXone  (ROCEPHIN )  2 g in sodium chloride  0.9 % 100 mL IVPB        2 g 200 mL/hr over 30 Minutes Intravenous  Once 05/14/24 0108 05/14/24 0251   05/14/24 0115  azithromycin  (ZITHROMAX ) 500 mg in sodium chloride  0.9 % 250 mL IVPB        500 mg 250 mL/hr over 60 Minutes Intravenous  Once 05/14/24 0108 05/14/24 0224         Subjective: Patient seen and examined.  No new complaint.  No shortness of breath as long as she is on oxygen.  Objective: Vitals:   05/16/24 0256 05/16/24 0400 05/16/24 0425 05/16/24 0600  BP:  139/76  126/78  Pulse:  77  76  Resp:  12  17  Temp:   98 F (36.7 C)   TempSrc:   Oral   SpO2: 93% 94%  96%  Weight:      Height:        Intake/Output Summary (Last 24 hours) at 05/16/2024 0804 Last data filed at 05/16/2024 0038 Gross per 24 hour  Intake 850 ml  Output --  Net 850 ml   Filed Weights   05/14/24 0900 05/14/24 1612 05/15/24 0438  Weight: 97.1 kg 95.1 kg 97.8 kg    Examination:  General exam: Appears calm and comfortable  Respiratory system: Diffuse inspiratory and expiratory wheezes, slightly improved compared to yesterday.  Faint rhonchi, which is also improved compared to yesterday. Cardiovascular system: S1 & S2 heard, RRR. No JVD, murmurs, rubs, gallops or clicks. No pedal edema. Gastrointestinal system: Abdomen is nondistended, soft and nontender. No organomegaly or masses felt. Normal bowel sounds heard. Central nervous system: Alert and oriented. No focal neurological deficits. Extremities: Symmetric 5 x 5 power. Skin: No rashes, lesions or ulcers.  Psychiatry: Judgement and insight appear normal. Mood & affect appropriate.   Data Reviewed: I have personally reviewed following labs and imaging studies  CBC: Recent Labs  Lab 05/13/24 2347 05/15/24 0245 05/16/24 0251  WBC 14.3* 16.8* 12.5*  NEUTROABS 11.8* 15.3*  --   HGB 14.3 13.5 13.7  HCT 44.1 42.5 44.3  MCV 89.3 90.6 92.1  PLT 247 282 288   Basic Metabolic Panel: Recent Labs  Lab  05/13/24 2347 05/14/24 0905 05/15/24 0245 05/16/24 0251  NA 127* 128* 131* 130*  K 4.0 4.3 4.5 5.0  CL 91* 92* 94* 93*  CO2 26 24 27 29   GLUCOSE 107* 149* 143* 146*  BUN 13 14 20 22   CREATININE 0.56 0.57 0.49 0.55  CALCIUM  9.6 9.5 9.3 9.3   GFR: Estimated Creatinine Clearance: 75 mL/min (by C-G formula based on SCr of 0.55 mg/dL). Liver Function Tests: Recent Labs  Lab 05/14/24 0905  AST 16  ALT 13  ALKPHOS 73  BILITOT 0.4  PROT 6.9  ALBUMIN 3.6   No results for input(s): LIPASE, AMYLASE in the last 168 hours. No results for input(s): AMMONIA in the last 168 hours. Coagulation Profile: No results for input(s): INR, PROTIME in the last 168 hours. Cardiac Enzymes: No results for input(s): CKTOTAL, CKMB, CKMBINDEX, TROPONINI in the last 168 hours. BNP (last 3 results) Recent Labs    05/14/24  0905  PROBNP 468.0*   HbA1C: Recent Labs    05/14/24 0905  HGBA1C 5.6   CBG: No results for input(s): GLUCAP in the last 168 hours. Lipid Profile: No results for input(s): CHOL, HDL, LDLCALC, TRIG, CHOLHDL, LDLDIRECT in the last 72 hours. Thyroid Function Tests: No results for input(s): TSH, T4TOTAL, FREET4, T3FREE, THYROIDAB in the last 72 hours. Anemia Panel: No results for input(s): VITAMINB12, FOLATE, FERRITIN, TIBC, IRON, RETICCTPCT in the last 72 hours. Sepsis Labs: No results for input(s): PROCALCITON, LATICACIDVEN in the last 168 hours.  Recent Results (from the past 240 hours)  Resp panel by RT-PCR (RSV, Flu A&B, Covid) Anterior Nasal Swab     Status: None   Collection Time: 05/13/24  1:20 AM   Specimen: Anterior Nasal Swab  Result Value Ref Range Status   SARS Coronavirus 2 by RT PCR NEGATIVE NEGATIVE Final    Comment: (NOTE) SARS-CoV-2 target nucleic acids are NOT DETECTED.  The SARS-CoV-2 RNA is generally detectable in upper respiratory specimens during the acute phase of infection. The  lowest concentration of SARS-CoV-2 viral copies this assay can detect is 138 copies/mL. A negative result does not preclude SARS-Cov-2 infection and should not be used as the sole basis for treatment or other patient management decisions. A negative result may occur with  improper specimen collection/handling, submission of specimen other than nasopharyngeal swab, presence of viral mutation(s) within the areas targeted by this assay, and inadequate number of viral copies(<138 copies/mL). A negative result must be combined with clinical observations, patient history, and epidemiological information. The expected result is Negative.  Fact Sheet for Patients:  BloggerCourse.com  Fact Sheet for Healthcare Providers:  SeriousBroker.it  This test is no t yet approved or cleared by the [place]  FDA and  has been authorized for detection and/or diagnosis of SARS-CoV-2 by FDA under an Emergency Use Authorization (EUA). This EUA will remain  in effect (meaning this test can be used) for the duration of the COVID-19 declaration under Section 564(b)(1) of the Act, 21 U.S.C.section 360bbb-3(b)(1), unless the authorization is terminated  or revoked sooner.       Influenza A by PCR NEGATIVE NEGATIVE Final   Influenza B by PCR NEGATIVE NEGATIVE Final    Comment: (NOTE) The Xpert Xpress SARS-CoV-2/FLU/RSV plus assay is intended as an aid in the diagnosis of influenza from Nasopharyngeal swab specimens and should not be used as a sole basis for treatment. Nasal washings and aspirates are unacceptable for Xpert Xpress SARS-CoV-2/FLU/RSV testing.  Fact Sheet for Patients: BloggerCourse.com  Fact Sheet for Healthcare Providers: SeriousBroker.it  This test is not yet approved or cleared by the [place]  FDA and has been authorized for detection and/or diagnosis of SARS-CoV-2 by FDA under  an Emergency Use Authorization (EUA). This EUA will remain in effect (meaning this test can be used) for the duration of the COVID-19 declaration under Section 564(b)(1) of the Act, 21 U.S.C. section 360bbb-3(b)(1), unless the authorization is terminated or revoked.     Resp Syncytial Virus by PCR NEGATIVE NEGATIVE Final    Comment: (NOTE) Fact Sheet for Patients: BloggerCourse.com  Fact Sheet for Healthcare Providers: SeriousBroker.it  This test is not yet approved or cleared by the [place]  FDA and has been authorized for detection and/or diagnosis of SARS-CoV-2 by FDA under an Emergency Use Authorization (EUA). This EUA will remain in effect (meaning this test can be used) for the duration of the COVID-19 declaration under Section 564(b)(1) of the Act, 21 U.S.C. section  360bbb-3(b)(1), unless the authorization is terminated or revoked.  Performed at [facility], [address] [place], [place] [address]   MRSA Next Gen by PCR, Nasal     Status: None   Collection Time: 05/14/24  8:59 AM   Specimen: Nasal Mucosa; Nasal Swab  Result Value Ref Range Status   MRSA by PCR Next Gen NOT DETECTED NOT DETECTED Final    Comment: (NOTE) The GeneXpert MRSA Assay (FDA approved for NASAL specimens only), is one component of a comprehensive MRSA colonization surveillance program. It is not intended to diagnose MRSA infection nor to guide or monitor treatment for MRSA infections. Test performance is not FDA approved in patients less than 38 years old. Performed at [facility], [address] [place], [place] [address]   Expectorated Sputum Assessment w Gram Stain, Rflx to Resp Cult     Status: None   Collection Time: 05/14/24  4:44 PM   Specimen: Expectorated Sputum  Result Value Ref Range Status   Specimen Description EXPECTORATED SPUTUM  Final   Special Requests NONE  Final   Sputum  evaluation   Final    Sputum specimen not acceptable for testing.  Please recollect.   J Santiago,RN on 05/15/2024 at 1654 by [facility] Performed at [facility], [address], [place], [place] [address]    Report Status 05/15/2024 FINAL  Final  Respiratory (~20 pathogens) panel by PCR     Status: Abnormal   Collection Time: 05/15/24  9:30 AM   Specimen: Nasopharyngeal Swab; Respiratory  Result Value Ref Range Status   Adenovirus NOT DETECTED NOT DETECTED Final   Coronavirus 229E NOT DETECTED NOT DETECTED Final    Comment: (NOTE) The Coronavirus on the Respiratory Panel, DOES NOT test for the novel  Coronavirus (2019 nCoV)    Coronavirus HKU1 NOT DETECTED NOT DETECTED Final   Coronavirus NL63 NOT DETECTED NOT DETECTED Final   Coronavirus OC43 NOT DETECTED NOT DETECTED Final   Metapneumovirus NOT DETECTED NOT DETECTED Final   Rhinovirus / Enterovirus DETECTED (A) NOT DETECTED Final   Influenza A NOT DETECTED NOT DETECTED Final   Influenza B NOT DETECTED NOT DETECTED Final   Parainfluenza Virus 1 NOT DETECTED NOT DETECTED Final   Parainfluenza Virus 2 NOT DETECTED NOT DETECTED Final   Parainfluenza Virus 3 NOT DETECTED NOT DETECTED Final   Parainfluenza Virus 4 NOT DETECTED NOT DETECTED Final   Respiratory Syncytial Virus NOT DETECTED NOT DETECTED Final   Bordetella pertussis NOT DETECTED NOT DETECTED Final   Bordetella Parapertussis NOT DETECTED NOT DETECTED Final   Chlamydophila pneumoniae NOT DETECTED NOT DETECTED Final   Mycoplasma pneumoniae NOT DETECTED NOT DETECTED Final    Comment: Performed at [facility], [address], [place], [place] [address]     Radiology Studies: ECHOCARDIOGRAM COMPLETE Result Date: 05/15/2024    ECHOCARDIOGRAM REPORT   Patient Name:   SULEMA BRAID Date of Exam: 05/15/2024 Medical Rec #:  [id number]       Height:       61.0 in Accession #:    [id number]      Weight:       215.6 lb Date of Birth:  05-22-1956       BSA:           1.950 m Patient Age:    67 years        BP:           134/76 mmHg Patient  Gender: F               HR:           73 bpm. Exam Location:  Inpatient Procedure: 2D Echo, Cardiac Doppler, Color Doppler and Intracardiac            Opacification Agent (Both Spectral and Color Flow Doppler were            utilized during procedure). Indications:    CHF-Acute Systolic  History:        Patient has no prior history of Echocardiogram examinations.                 COPD, Signs/Symptoms:Shortness of Breath and Hypoxemia; Risk                 Factors:Hypertension, Current Smoker and Dyslipidemia.  Sonographer:    Logan Shove RDCS Referring Phys: [id number] [facility]  Sonographer Comments: Technically difficult study due to poor echo windows. IMPRESSIONS  1. Left ventricular ejection fraction, by estimation, is 60 to 65%. The left ventricle has normal function. The left ventricle has no regional wall motion abnormalities. Left ventricular diastolic parameters were normal.  2. Right ventricular systolic function is normal. The right ventricular size is normal. Tricuspid regurgitation signal is inadequate for assessing PA pressure.  3. Left atrial size was mildly dilated.  4. The mitral valve is grossly normal. Trivial mitral valve regurgitation.  5. The aortic valve was not well visualized. Aortic valve regurgitation is not visualized.  6. The inferior vena cava is dilated in size with >50% respiratory variability, suggesting right atrial pressure of 8 mmHg. Comparison(s): No prior Echocardiogram. FINDINGS  Left Ventricle: Left ventricular ejection fraction, by estimation, is 60 to 65%. The left ventricle has normal function. The left ventricle has no regional wall motion abnormalities. Definity contrast agent was given IV to delineate the left ventricular  endocardial borders. The left ventricular internal cavity size was normal in size. There is borderline left ventricular hypertrophy. Left ventricular diastolic parameters  were normal. Right Ventricle: The right ventricular size is normal. No increase in right ventricular wall thickness. Right ventricular systolic function is normal. Tricuspid regurgitation signal is inadequate for assessing PA pressure. Left Atrium: Left atrial size was mildly dilated. Right Atrium: Right atrial size was normal in size. Pericardium: There is no evidence of pericardial effusion. Presence of epicardial fat layer. Mitral Valve: The mitral valve is grossly normal. Trivial mitral valve regurgitation. Tricuspid Valve: The tricuspid valve is grossly normal. Tricuspid valve regurgitation is trivial. Aortic Valve: The aortic valve was not well visualized. Aortic valve regurgitation is not visualized. Aortic valve peak gradient measures 5.8 mmHg. Pulmonic Valve: The pulmonic valve was grossly normal. Pulmonic valve regurgitation is trivial. Aorta: The aortic root and ascending aorta are structurally normal, with no evidence of dilitation. Venous: The inferior vena cava is dilated in size with greater than 50% respiratory variability, suggesting right atrial pressure of 8 mmHg. IAS/Shunts: No atrial level shunt detected by color flow Doppler. Additional Comments: 3D was performed not requiring image post processing on an independent workstation and was indeterminate.  LEFT VENTRICLE PLAX 2D LVIDd:         4.50 cm      Diastology LVIDs:         3.50 cm      LV e' medial:    7.29 cm/s LV PW:         0.80 cm      LV E/e' medial:  12.2 LV IVS:        1.10 cm      LV e' lateral:   9.46 cm/s LVOT diam:     2.00 cm      LV E/e' lateral: 9.4 LVOT Area:     3.14 cm  LV Volumes (MOD) LV vol d, MOD A2C: 132.0 ml LV vol d, MOD A4C: 123.0 ml LV vol s, MOD A2C: 60.2 ml LV vol s, MOD A4C: 54.2 ml LV SV MOD A2C:     71.8 ml LV SV MOD A4C:     123.0 ml LV SV MOD BP:      69.3 ml RIGHT VENTRICLE             IVC RV Basal diam:  4.00 cm     IVC diam: 2.70 cm RV Mid diam:    2.00 cm RV S prime:     12.10 cm/s TAPSE (M-mode): 2.3  cm LEFT ATRIUM             Index        RIGHT ATRIUM           Index LA diam:        3.10 cm 1.59 cm/m   RA Area:     15.60 cm LA Vol (A2C):   75.8 ml 38.87 ml/m  RA Volume:   41.40 ml  21.23 ml/m LA Vol (A4C):   73.1 ml 37.49 ml/m LA Biplane Vol: 77.3 ml 39.64 ml/m  AORTIC VALVE AV Area (Vmax): 2.62 cm AV Vmax:        120.00 cm/s AV Peak Grad:   5.8 mmHg LVOT Vmax:      100.00 cm/s  AORTA Ao Root diam: 2.90 cm Ao Asc diam:  2.90 cm MITRAL VALVE MV Area (PHT): 3.77 cm    SHUNTS MV Decel Time: 201 msec    Systemic Diam: 2.00 cm MV E velocity: 88.60 cm/s MV A velocity: 92.70 cm/s MV E/A ratio:  0.96 Jayson Sierras MD Electronically signed by Jayson Sierras MD Signature Date/Time: 05/15/2024/11:50:33 AM    Final     Scheduled Meds:  budesonide   0.5 mg Nebulization BID   buPROPion  150 mg Oral BID   busPIRone   10 mg Oral TID   Chlorhexidine Gluconate Cloth  6 each Topical Daily   enoxaparin  (LOVENOX ) injection  40 mg Subcutaneous Q24H   ezetimibe  10 mg Oral Daily   fluticasone   1 spray Each Nare BID   guaiFENesin   600 mg Oral BID   irbesartan   75 mg Oral Daily   And   hydrochlorothiazide   6.25 mg Oral Daily   ipratropium-albuterol   3 mL Nebulization Q6H   loratadine  10 mg Oral Daily   methylPREDNISolone  (SOLU-MEDROL ) injection  60 mg Intravenous Q12H   montelukast   10 mg Oral Daily   sertraline   200 mg Oral Daily   [START ON 05/20/2024] Vitamin D  (Ergocalciferol )  50,000 Units Oral Q7 days   Continuous Infusions:  azithromycin  (ZITHROMAX ) 500 mg in sodium chloride  0.9 % 250 mL IVPB Stopped (05/15/24 2212)   cefTRIAXone  (ROCEPHIN )  IV Stopped (05/16/24 0038)     LOS: 2 days   Fredia Skeeter, MD [facility]  05/16/2024, 8:04 AM   *Please note that this is a verbal dictation therefore any spelling or grammatical errors are due to the Dragon Medical One system interpretation.  Please page via Amion and do not message via secure chat for urgent patient care matters.  Secure chat  can be used for non urgent patient care matters.  How to contact the [facility] Attending or Consulting provider 7A - 7P or covering provider during after hours 7P -7A, for this patient?  Check the care team in [facility] and look for a) attending/consulting [facility] provider listed and b) the [facility] team listed. Page or secure chat 7A-7P. Log into [email or web address] and use [place]'s universal password to access. If you do not have the password, please contact the hospital operator. Locate the [facility] provider you are looking for under [facility] and page to a number that you can be directly reached. If you still have difficulty reaching the provider, please page the [facility] (Director on Call) for the Hospitalists listed on amion for assistance.

## 2024-05-17 DIAGNOSIS — J441 Chronic obstructive pulmonary disease with (acute) exacerbation: Secondary | ICD-10-CM | POA: Diagnosis not present

## 2024-05-17 LAB — CBC WITH DIFFERENTIAL/PLATELET
Abs Immature Granulocytes: 0.12 K/uL — ABNORMAL HIGH (ref 0.00–0.07)
Basophils Absolute: 0 K/uL (ref 0.0–0.1)
Basophils Relative: 0 %
Eosinophils Absolute: 0 K/uL (ref 0.0–0.5)
Eosinophils Relative: 0 %
HCT: 44.3 % (ref 36.0–46.0)
Hemoglobin: 14.3 g/dL (ref 12.0–15.0)
Immature Granulocytes: 1 %
Lymphocytes Relative: 12 %
Lymphs Abs: 1.1 K/uL (ref 0.7–4.0)
MCH: 29.2 pg (ref 26.0–34.0)
MCHC: 32.3 g/dL (ref 30.0–36.0)
MCV: 90.6 fL (ref 80.0–100.0)
Monocytes Absolute: 0.4 K/uL (ref 0.1–1.0)
Monocytes Relative: 4 %
Neutro Abs: 7.6 K/uL (ref 1.7–7.7)
Neutrophils Relative %: 83 %
Platelets: 304 K/uL (ref 150–400)
RBC: 4.89 MIL/uL (ref 3.87–5.11)
RDW: 12.8 % (ref 11.5–15.5)
WBC: 9.1 K/uL (ref 4.0–10.5)
nRBC: 0 % (ref 0.0–0.2)

## 2024-05-17 MED ORDER — AZITHROMYCIN 250 MG PO TABS
500.0000 mg | ORAL_TABLET | Freq: Every day | ORAL | Status: AC
Start: 2024-05-17 — End: 2024-05-18
  Administered 2024-05-17 – 2024-05-18 (×2): 500 mg via ORAL
  Filled 2024-05-17 (×2): qty 2

## 2024-05-17 MED ORDER — HYDROXYZINE HCL 10 MG PO TABS
10.0000 mg | ORAL_TABLET | Freq: Once | ORAL | Status: AC | PRN
  Administered 2024-05-17: 10 mg via ORAL
  Filled 2024-05-17: qty 1

## 2024-05-17 NOTE — Plan of Care (Signed)
  Problem: Respiratory: Goal: Ability to maintain a clear airway will improve Outcome: Not Progressing Goal: Levels of oxygenation will improve Outcome: Not Progressing Goal: Ability to maintain adequate ventilation will improve Outcome: Not Progressing   Problem: Activity: Goal: Ability to tolerate increased activity will improve Outcome: Not Progressing   Problem: Respiratory: Goal: Ability to maintain adequate ventilation will improve Outcome: Not Progressing Goal: Ability to maintain a clear airway will improve Outcome: Not Progressing

## 2024-05-17 NOTE — Progress Notes (Signed)
 PHARMACIST - PHYSICIAN COMMUNICATION DR:   [place] CONCERNING: Antibiotic IV to Oral Route Change Policy  RECOMMENDATION: This patient is receiving Azithromycin  by the intravenous route.  Based on criteria approved by the Pharmacy and Therapeutics Committee, the antibiotic(s) is being converted to the equivalent oral dose form(s).   DESCRIPTION: These criteria include: Patient being treated for a respiratory tract infection, urinary tract infection, cellulitis or clostridium difficile associated diarrhea if on metronidazole The patient is not neutropenic and does not exhibit a GI malabsorption state The patient is eating (either orally or via tube) and/or has been taking other orally administered medications for a least 24 hours The patient is improving clinically and has a Tmax < 100.5  If you have questions about this conversion, please contact the Pharmacy Department  []   [phone] )  Tammy Cowan []   [phone] )  Tammy Cowan  [x]   [phone] )  [facility] []   [phone] )  [facility]   R. Samual Satterfield, PharmD PGY-1 Acute Care Pharmacy Resident 05/17/2024 10:28 AM

## 2024-05-17 NOTE — TOC Initial Note (Signed)
 Transition of Care [facility]) - Initial/Assessment Note    Patient Details  Name: Tammy Cowan MRN: [id number] Date of Birth: 03/28/1956  Transition of Care [facility]) CM/SW Contact:    Jon ONEIDA Anon, RN Phone Number: 05/17/2024, 1:26 PM  Clinical Narrative:                 Pt is from home. Currently requiring a higher oxygen demand. Pt is needing continued medical workup, not medically ready to DC. ICM will follow for any new recommendations or DC needs.      Expected Discharge Plan: Home/Self Care Barriers to Discharge: Continued Medical Work up   Patient Goals and CMS Choice Patient states their goals for this hospitalization and ongoing recovery are:: Return home CMS Medicare.gov Compare Post Acute Care list provided to:: Other (Comment Required) (NA) Choice offered to / list presented to : NA [place] ownership interest in [facility].provided to:: Parent NA    Expected Discharge Plan and Services In-house Referral: NA Discharge Planning Services: CM Consult Post Acute Care Choice: NA Living arrangements for the past 2 months: Single Family Home                 DME Arranged: N/A DME Agency: NA       HH Arranged: NA HH Agency: NA        Prior Living Arrangements/Services Living arrangements for the past 2 months: Single Family Home Lives with:: Self Patient language and need for interpreter reviewed:: Yes Do you feel safe going back to the place where you live?: Yes      Need for Family Participation in Patient Care: Yes (Comment) Care giver support system in place?: Yes (comment) Current home services:  (NA) Criminal Activity/Legal Involvement Pertinent to Current Situation/Hospitalization: No - Comment as needed  Activities of Daily Living   ADL Screening (condition at time of admission) Independently performs ADLs?: Yes (appropriate for developmental age) Is the patient deaf or have difficulty hearing?: Yes Does the patient have difficulty  seeing, even when wearing glasses/contacts?: No Does the patient have difficulty concentrating, remembering, or making decisions?: No  Permission Sought/Granted Permission sought to share information with : Family Supports Permission granted to share information with : Yes, Verbal Permission Granted  Share Information with NAME: Sherre Norris (Daughter)  [phone]           Emotional Assessment       Orientation: : Oriented to Place, Oriented to Self, Oriented to  Time, Oriented to Situation Alcohol / Substance Use: Not Applicable Psych Involvement: No (comment)  Admission diagnosis:  Hyponatremia [E87.1] COPD exacerbation (HCC) [J44.1] Elevated random blood glucose level [R73.9] Acute hypoxemic respiratory failure (HCC) [J96.01] Community acquired pneumonia of left lung, unspecified part of lung [J18.9] Patient Active Problem List   Diagnosis Date Noted   COPD exacerbation (HCC) 05/14/2024   Oral thrush 02/03/2024   Allergic rhinitis 12/10/2023   Hyponatremia 12/11/2022   Dyslipidemia 12/11/2022   Depression 12/11/2022   Essential hypertension 12/11/2022   Acute hypoxemic respiratory failure (HCC) 12/11/2022   COPD, moderate (HCC) 12/11/2022   Low serum cortisol level 12/11/2022   Tobacco use 12/11/2022   Multifocal pneumonia 12/10/2022   PCP:  Wallie Drafts, NP Pharmacy:   [facility] [address] [place] - [address] [place] [address] Phone: [phone] Fax: [phone]  [facility] [address] [place] - [address] [place] [address] [place] [address] Phone: [phone]  Fax: [phone]     Social Drivers of Health (SDOH) Social History: SDOH Screenings   Food Insecurity: No Food Insecurity (05/14/2024)  Housing: Low Risk  (05/14/2024)  Transportation Needs: No Transportation Needs (05/14/2024)  Utilities: Not At Risk (05/14/2024)  Depression (PHQ2-9): Low Risk  (01/15/2024)   Financial Resource Strain: Low Risk  (04/28/2024)   Received from [facility]  Physical Activity: Inactive (04/28/2024)   Received from [facility]  Social Connections: Moderately Isolated (05/14/2024)  Stress: Stress Concern Present (04/28/2024)   Received from [facility]  Tobacco Use: High Risk (05/13/2024)   SDOH Interventions:     Readmission Risk Interventions    05/17/2024    1:21 PM  Readmission Risk Prevention Plan  Post Dischage Appt Complete  Medication Screening Complete  Transportation Screening Complete

## 2024-05-17 NOTE — Progress Notes (Signed)
 PROGRESS NOTE    Tammy Cowan  FMW:[id number] DOB: [birth year] DOA: 05/13/2024 PCP: Wallie Drafts, NP   Brief Narrative:  Tammy Cowan is a 68 y.o. 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    Assessment & Plan:   Principal Problem:   COPD exacerbation (HCC) Active Problems:   Acute hypoxemic respiratory failure (HCC)   Multifocal pneumonia   Depression   Essential hypertension  Acute hypoxemic respiratory failure secondary to acute exacerbation of COPD-asthma overlap syndrome in the setting of multifocal pneumonia  Chest x-ray showing increasing infiltrates in the lung bases suspicious for multifocal pneumonia or less likely edema.  COVID/influenza/RSV PCR negative.  RVP ordered but is still pending.  Patient clinically feeling better but requiring more oxygen.  No wheezes on examination.  Cultures are negative so far.  Continue following.  Repeat chest x-ray 1125 shows significant improvement.  Patient was on 45 L of oxygen this morning but was feeling much better and appreciated remained comfortable.  I have suspected that she was on high amount of oxygen unnecessarily.  Advised the [place] to wean her down.  By early afternoon, she is already down to 35 L and saturating 97%.  Nurse has been advised to continue to wean and maintain oxygen saturation between 90 and 92%.  Continue following. -Solu-Medrol  60 mg every 12 hours -DuoNeb every 6 hours -Pulmicort  neb twice daily -Albuterol  neb every 2 hours PRN -Ceftriaxone  and azithromycin  -Mucinex  -Incentive spirometry, flutter  valve -Continue supplemental oxygen, wean as tolerated  Pulmonary edema: Chest x-ray with some suspicion of possible pulmonary edema.  BNP elevated.  No history of heart failure.  Echo this admission with normal EF and no diastolic dysfunction.  No significant valvular heart disease.  Received 2 doses of Lasix for last 2 days.  Repeat x-ray 06/02/2024 is clear without any edema or infiltrates.   Hypertension Stable.  Continue valsartan  and hydrochlorothiazide .   Hyperlipidemia Continue Zetia.   History of prediabetes hemoglobin A1c this hospitalization is only 5.6.   Tobacco abuse Patient states she has not smoked any cigarettes for the past 1 week.  Continue Wellbutrin.   Depression Continue Wellbutrin, BuSpar , and Zoloft .   Chronic mild hyponatremia Sodium 127 (previously in the 128-134 range).  131 today.   Chronic allergic rhinitis Continue Flonase  nasal spray, Xyzal, and Singulair .  DVT prophylaxis: enoxaparin  (LOVENOX ) injection 40 mg Start: 05/14/24 1000   Code Status: Full Code  Family Communication:  None present at bedside.  Plan of care discussed with patient in length and he/she verbalized understanding and agreed with it.  Status is: Inpatient Remains inpatient appropriate because: Requiring high amount of oxygen   Estimated body mass index is 40.86 kg/m as calculated from the following:   Height as of this encounter: 5' 1 (1.549 m).   Weight as of this encounter: 98.1 kg.    Nutritional Assessment: Body mass index is 40.86 kg/m.SABRA Seen by dietician.  I agree with the assessment and plan as outlined below: Nutrition Status:        . Skin Assessment: I have examined the patient's skin and I agree with the wound assessment as performed by the wound care RN as  outlined below:    Consultants:  None  Procedures:  None  Antimicrobials:  Anti-infectives (From admission, onward)    Start     Dose/Rate Route Frequency Ordered Stop   05/17/24 1115   azithromycin  (ZITHROMAX ) tablet 500 mg        500 mg Oral Daily 05/17/24 1027 05/18/24 2359   05/15/24 0000  cefTRIAXone  (ROCEPHIN ) 2 g in sodium chloride  0.9 % 100 mL IVPB        2 g 200 mL/hr over 30 Minutes Intravenous Every 24 hours 05/14/24 0628 05/19/24 1148   05/14/24 2200  azithromycin  (ZITHROMAX ) 500 mg in sodium chloride  0.9 % 250 mL IVPB  Status:  Discontinued        500 mg 250 mL/hr over 60 Minutes Intravenous Every 24 hours 05/14/24 0628 05/17/24 1027   05/14/24 0115  cefTRIAXone  (ROCEPHIN ) 2 g in sodium chloride  0.9 % 100 mL IVPB        2 g 200 mL/hr over 30 Minutes Intravenous  Once 05/14/24 0108 05/14/24 0251   05/14/24 0115  azithromycin  (ZITHROMAX ) 500 mg in sodium chloride  0.9 % 250 mL IVPB        500 mg 250 mL/hr over 60 Minutes Intravenous  Once 05/14/24 0108 05/14/24 0224         Subjective: Patient seen and examined.  She states that she is feeling much better.  She has no other complaints.  Objective: Vitals:   05/17/24 0810 05/17/24 1000 05/17/24 1100 05/17/24 1200  BP:  115/81    Pulse:  86 73   Resp:  18 (!) 23   Temp: 98 F (36.7 C)   98 F (36.7 C)  TempSrc: Oral   Oral  SpO2:  94% 97%   Weight:      Height:        Intake/Output Summary (Last 24 hours) at 05/17/2024 1318 Last data filed at 05/17/2024 0042 Gross per 24 hour  Intake 750 ml  Output --  Net 750 ml   Filed Weights   05/14/24 1612 05/15/24 0438 05/17/24 0400  Weight: 95.1 kg 97.8 kg 98.1 kg    Examination:  General exam: Appears calm and comfortable  Respiratory system: Expecting wheezes, much improved compared yesterday.  Faint rhonchi, positive but improved compared to yesterday. Cardiovascular system: S1 & S2 heard, RRR. No JVD, murmurs, rubs, gallops or clicks. No pedal edema. Gastrointestinal system: Abdomen is nondistended, soft and nontender. No organomegaly or masses felt. Normal bowel sounds heard. Central nervous system: Alert and oriented. No focal neurological  deficits. Extremities: Symmetric 5 x 5 power. Skin: No rashes, lesions or ulcers.  Psychiatry: Judgement and insight appear normal. Mood & affect appropriate.   Data Reviewed: I have personally reviewed following labs and imaging studies  CBC: Recent Labs  Lab 05/13/24 2347 05/15/24 0245 05/16/24 0251 05/17/24 0256  WBC 14.3* 16.8* 12.5* 9.1  NEUTROABS 11.8* 15.3*  --  7.6  HGB 14.3 13.5 13.7 14.3  HCT 44.1 42.5 44.3 44.3  MCV 89.3 90.6 92.1 90.6  PLT 247 282 288 304   Basic Metabolic Panel: Recent Labs  Lab 05/13/24 2347 05/14/24 0905 05/15/24 0245 05/16/24 0251  NA 127* 128* 131* 130*  K 4.0 4.3 4.5 5.0  CL 91* 92* 94* 93*  CO2 26 24 27 29   GLUCOSE 107* 149* 143* 146*  BUN 13 14 20 22   CREATININE 0.56 0.57 0.49 0.55  CALCIUM  9.6 9.5 9.3 9.3   GFR: Estimated Creatinine Clearance: 75.1 mL/min (by  C-G formula based on SCr of 0.55 mg/dL). Liver Function Tests: Recent Labs  Lab 05/14/24 0905  AST 16  ALT 13  ALKPHOS 73  BILITOT 0.4  PROT 6.9  ALBUMIN 3.6   No results for input(s): LIPASE, AMYLASE in the last 168 hours. No results for input(s): AMMONIA in the last 168 hours. Coagulation Profile: No results for input(s): INR, PROTIME in the last 168 hours. Cardiac Enzymes: No results for input(s): CKTOTAL, CKMB, CKMBINDEX, TROPONINI in the last 168 hours. BNP (last 3 results) Recent Labs    05/14/24 0905  PROBNP 468.0*   HbA1C: No results for input(s): HGBA1C in the last 72 hours.  CBG: No results for input(s): GLUCAP in the last 168 hours. Lipid Profile: No results for input(s): CHOL, HDL, LDLCALC, TRIG, CHOLHDL, LDLDIRECT in the last 72 hours. Thyroid Function Tests: No results for input(s): TSH, T4TOTAL, FREET4, T3FREE, THYROIDAB in the last 72 hours. Anemia Panel: No results for input(s): VITAMINB12, FOLATE, FERRITIN, TIBC, IRON, RETICCTPCT in the last 72 hours. Sepsis Labs: No results for  input(s): PROCALCITON, LATICACIDVEN in the last 168 hours.  Recent Results (from the past 240 hours)  Resp panel by RT-PCR (RSV, Flu A&B, Covid) Anterior Nasal Swab     Status: None   Collection Time: 05/13/24  1:20 AM   Specimen: Anterior Nasal Swab  Result Value Ref Range Status   SARS Coronavirus 2 by RT PCR NEGATIVE NEGATIVE Final    Comment: (NOTE) SARS-CoV-2 target nucleic acids are NOT DETECTED.  The SARS-CoV-2 RNA is generally detectable in upper respiratory specimens during the acute phase of infection. The lowest concentration of SARS-CoV-2 viral copies this assay can detect is 138 copies/mL. A negative result does not preclude SARS-Cov-2 infection and should not be used as the sole basis for treatment or other patient management decisions. A negative result may occur with  improper specimen collection/handling, submission of specimen other than nasopharyngeal swab, presence of viral mutation(s) within the areas targeted by this assay, and inadequate number of viral copies(<138 copies/mL). A negative result must be combined with clinical observations, patient history, and epidemiological information. The expected result is Negative.  Fact Sheet for Patients:  BloggerCourse.com  Fact Sheet for Healthcare Providers:  SeriousBroker.it  This test is no t yet approved or cleared by the [place]  FDA and  has been authorized for detection and/or diagnosis of SARS-CoV-2 by FDA under an Emergency Use Authorization (EUA). This EUA will remain  in effect (meaning this test can be used) for the duration of the COVID-19 declaration under Section 564(b)(1) of the Act, 21 U.S.C.section 360bbb-3(b)(1), unless the authorization is terminated  or revoked sooner.       Influenza A by PCR NEGATIVE NEGATIVE Final   Influenza B by PCR NEGATIVE NEGATIVE Final    Comment: (NOTE) The Xpert Xpress SARS-CoV-2/FLU/RSV plus assay is  intended as an aid in the diagnosis of influenza from Nasopharyngeal swab specimens and should not be used as a sole basis for treatment. Nasal washings and aspirates are unacceptable for Xpert Xpress SARS-CoV-2/FLU/RSV testing.  Fact Sheet for Patients: BloggerCourse.com  Fact Sheet for Healthcare Providers: SeriousBroker.it  This test is not yet approved or cleared by the [place]  FDA and has been authorized for detection and/or diagnosis of SARS-CoV-2 by FDA under an Emergency Use Authorization (EUA). This EUA will remain in effect (meaning this test can be used) for the duration of the COVID-19 declaration under Section 564(b)(1) of the Act, 21 U.S.C. section 360bbb-3(b)(1), unless  the authorization is terminated or revoked.     Resp Syncytial Virus by PCR NEGATIVE NEGATIVE Final    Comment: (NOTE) Fact Sheet for Patients: BloggerCourse.com  Fact Sheet for Healthcare Providers: SeriousBroker.it  This test is not yet approved or cleared by the [place]  FDA and has been authorized for detection and/or diagnosis of SARS-CoV-2 by FDA under an Emergency Use Authorization (EUA). This EUA will remain in effect (meaning this test can be used) for the duration of the COVID-19 declaration under Section 564(b)(1) of the Act, 21 U.S.C. section 360bbb-3(b)(1), unless the authorization is terminated or revoked.  Performed at [facility], [address] [place], [place] [address]   MRSA Next Gen by PCR, Nasal     Status: None   Collection Time: 05/14/24  8:59 AM   Specimen: Nasal Mucosa; Nasal Swab  Result Value Ref Range Status   MRSA by PCR Next Gen NOT DETECTED NOT DETECTED Final    Comment: (NOTE) The GeneXpert MRSA Assay (FDA approved for NASAL specimens only), is one component of a comprehensive MRSA colonization surveillance program. It is not  intended to diagnose MRSA infection nor to guide or monitor treatment for MRSA infections. Test performance is not FDA approved in patients less than 53 years old. Performed at [facility], [address], [place], [place] [address]   Expectorated Sputum Assessment w Gram Stain, Rflx to Resp Cult     Status: None   Collection Time: 05/14/24  4:44 PM   Specimen: Expectorated Sputum  Result Value Ref Range Status   Specimen Description EXPECTORATED SPUTUM  Final   Special Requests NONE  Final   Sputum evaluation   Final    Sputum specimen not acceptable for testing.  Please recollect.   J Santiago,RN on 05/15/2024 at 1654 by [facility] Performed at [facility], [address] [place], [place] [address]    Report Status 05/15/2024 FINAL  Final  Respiratory (~20 pathogens) panel by PCR     Status: Abnormal   Collection Time: 05/15/24  9:30 AM   Specimen: Nasopharyngeal Swab; Respiratory  Result Value Ref Range Status   Adenovirus NOT DETECTED NOT DETECTED Final   Coronavirus 229E NOT DETECTED NOT DETECTED Final    Comment: (NOTE) The Coronavirus on the Respiratory Panel, DOES NOT test for the novel  Coronavirus (2019 nCoV)    Coronavirus HKU1 NOT DETECTED NOT DETECTED Final   Coronavirus NL63 NOT DETECTED NOT DETECTED Final   Coronavirus OC43 NOT DETECTED NOT DETECTED Final   Metapneumovirus NOT DETECTED NOT DETECTED Final   Rhinovirus / Enterovirus DETECTED (A) NOT DETECTED Final   Influenza A NOT DETECTED NOT DETECTED Final   Influenza B NOT DETECTED NOT DETECTED Final   Parainfluenza Virus 1 NOT DETECTED NOT DETECTED Final   Parainfluenza Virus 2 NOT DETECTED NOT DETECTED Final   Parainfluenza Virus 3 NOT DETECTED NOT DETECTED Final   Parainfluenza Virus 4 NOT DETECTED NOT DETECTED Final   Respiratory Syncytial Virus NOT DETECTED NOT DETECTED Final   Bordetella pertussis NOT DETECTED NOT DETECTED Final   Bordetella Parapertussis NOT  DETECTED NOT DETECTED Final   Chlamydophila pneumoniae NOT DETECTED NOT DETECTED Final   Mycoplasma pneumoniae NOT DETECTED NOT DETECTED Final    Comment: Performed at [facility], [address], [place], [place] [address]     Radiology Studies: DG CHEST PORT 1 VIEW Result Date: 05/16/2024 CLINICAL DATA:  [id number] Pneumonia [id number]. EXAM: PORTABLE CHEST 1 VIEW  COMPARISON:  05/13/2024. FINDINGS: Bilateral lung fields are clear. Bilateral costophrenic angles are clear. Normal cardio-mediastinal silhouette. No acute osseous abnormalities. The soft tissues are within normal limits. IMPRESSION: No active disease. Electronically Signed   By: Ree Molt M.D.   On: 05/16/2024 08:52    Scheduled Meds:  azithromycin   500 mg Oral Daily   budesonide   0.5 mg Nebulization BID   buPROPion  150 mg Oral BID   busPIRone   10 mg Oral TID   Chlorhexidine Gluconate Cloth  6 each Topical Daily   enoxaparin  (LOVENOX ) injection  40 mg Subcutaneous Q24H   ezetimibe  10 mg Oral Daily   fluticasone   1 spray Each Nare BID   guaiFENesin   600 mg Oral BID   irbesartan   75 mg Oral Daily   And   hydrochlorothiazide   6.25 mg Oral Daily   ipratropium-albuterol   3 mL Nebulization Q6H   loratadine  10 mg Oral Daily   methylPREDNISolone  (SOLU-MEDROL ) injection  60 mg Intravenous Q12H   montelukast   10 mg Oral Daily   sertraline   200 mg Oral Daily   [START ON 05/20/2024] Vitamin D  (Ergocalciferol )  50,000 Units Oral Q7 days   Continuous Infusions:  cefTRIAXone  (ROCEPHIN )  IV Stopped (05/17/24 0042)     LOS: 3 days   Fredia Skeeter, MD [facility]  05/17/2024, 1:18 PM   *Please note that this is a verbal dictation therefore any spelling or grammatical errors are due to the Dragon Medical One system interpretation.  Please page via Amion and do not message via secure chat for urgent patient care matters. Secure chat can be used for non urgent patient care matters.  How to contact the [facility]  Attending or Consulting provider 7A - 7P or covering provider during after hours 7P -7A, for this patient?  Check the care team in [facility] and look for a) attending/consulting [facility] provider listed and b) the [facility] team listed. Page or secure chat 7A-7P. Log into [email or web address] and use [facility]'s universal password to access. If you do not have the password, please contact the hospital operator. Locate the [facility] provider you are looking for under [facility] and page to a number that you can be directly reached. If you still have difficulty reaching the provider, please page the [facility] (Director on Call) for the Hospitalists listed on amion for assistance.

## 2024-05-18 ENCOUNTER — Telehealth: Payer: Self-pay

## 2024-05-18 DIAGNOSIS — J441 Chronic obstructive pulmonary disease with (acute) exacerbation: Secondary | ICD-10-CM | POA: Diagnosis not present

## 2024-05-18 LAB — BASIC METABOLIC PANEL WITH GFR
Anion gap: 10 (ref 5–15)
BUN: 18 mg/dL (ref 8–23)
CO2: 30 mmol/L (ref 22–32)
Calcium: 9.7 mg/dL (ref 8.9–10.3)
Chloride: 91 mmol/L — ABNORMAL LOW (ref 98–111)
Creatinine, Ser: 0.56 mg/dL (ref 0.44–1.00)
GFR, Estimated: >60 mL/min (ref >60)
Glucose, Bld: 119 mg/dL — ABNORMAL HIGH (ref 70–99)
Potassium: 4.7 mmol/L (ref 3.5–5.1)
Sodium: 132 mmol/L — ABNORMAL LOW (ref 135–145)

## 2024-05-18 LAB — CBC
HCT: 47 % — ABNORMAL HIGH (ref 36.0–46.0)
Hemoglobin: 15.4 g/dL — ABNORMAL HIGH (ref 12.0–15.0)
MCH: 29.4 pg (ref 26.0–34.0)
MCHC: 32.8 g/dL (ref 30.0–36.0)
MCV: 89.7 fL (ref 80.0–100.0)
Platelets: 340 K/uL (ref 150–400)
RBC: 5.24 MIL/uL — ABNORMAL HIGH (ref 3.87–5.11)
RDW: 12.6 % (ref 11.5–15.5)
WBC: 11.3 K/uL — ABNORMAL HIGH (ref 4.0–10.5)
nRBC: 0 % (ref 0.0–0.2)

## 2024-05-18 NOTE — Plan of Care (Signed)
?  Problem: Activity: Goal: Ability to tolerate increased activity will improve Outcome: Progressing Goal: Will verbalize the importance of balancing activity with adequate rest periods Outcome: Progressing   Problem: Respiratory: Goal: Ability to maintain a clear airway will improve Outcome: Progressing Goal: Levels of oxygenation will improve Outcome: Progressing Goal: Ability to maintain adequate ventilation will improve Outcome: Progressing   Problem: Activity: Goal: Ability to tolerate increased activity will improve Outcome: Progressing   

## 2024-05-18 NOTE — Telephone Encounter (Signed)
 Received fax from [employer] with renewal form for Nucala . Completed form and faxed with insurance card to [employer]. Will update once we receive a response.  Phone #: [phone] opt. [phone] Fax #: [phone]

## 2024-05-18 NOTE — Plan of Care (Signed)
  Problem: Education: Goal: Knowledge of the prescribed therapeutic regimen will improve Outcome: Progressing   Problem: Activity: Goal: Ability to tolerate increased activity will improve Outcome: Progressing Goal: Will verbalize the importance of balancing activity with adequate rest periods Outcome: Progressing   Problem: Respiratory: Goal: Ability to maintain a clear airway will improve Outcome: Progressing Goal: Levels of oxygenation will improve Outcome: Progressing Goal: Ability to maintain adequate ventilation will improve Outcome: Progressing

## 2024-05-18 NOTE — Progress Notes (Addendum)
 PROGRESS NOTE  Tammy Cowan FMW:[id number] DOB: 01-26-1956 DOA: 05/13/2024 PCP: Wallie Drafts, NP   LOS: 4 days   Brief narrative:  Tammy Cowan is a 68 y.o. 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    Assessment/Plan: Principal Problem:   COPD exacerbation (HCC) Active Problems:   Acute hypoxemic respiratory failure (HCC)   Multifocal pneumonia   Depression   Essential hypertension  Acute hypoxemic respiratory failure secondary to acute exacerbation of COPD-asthma overlap syndrome in the setting of multifocal pneumonia Rhinovirus infection. Chest x-ray showing increasing infiltrates in the lung bases suspicious for multifocal pneumonia or less likely edema.  COVID/influenza/RSV PCR negative.  RVP with rhinovirus infection.  Patient clinically feeling much better.  Cultures negative.  Chest x-ray repeated showed improvement.  Was initially on 45 L of high flow nasal cannula oxygen.  This has been decreased to 10 L/min.  Continue frequent nebulization, inhaled steroids, IV Solu Medrol  60 mg every 12 hours.  Continue Rocephin  and Zithromax .  Continue incentive spirometry flutter valve and Mucinex .  Will continue to wean oxygen as able.   Pulmonary edema: Resolved.  2D echocardiogram with preserved LV function.  Received 2 doses of IV Lasix.   Hypertension Continue valsartan  and HCTZ   Hyperlipidemia Continue Zetia.   History of prediabetes Latest hemoglobin A1c of 5.6.   Tobacco abuse Has not smoked in a week.  Continue Wellbutrin.   History of depression Continue Wellbutrin,  BuSpar , and Zoloft .   Chronic mild hyponatremia Sodium 132 today.  Previously sodium levels between 128 134.   Chronic allergic rhinitis Continue Flonase  nasal spray, Xyzal, and Singulair .  Generalized weakness.  Will get PT evaluation.  Encourage ambulation as able.  DVT prophylaxis: enoxaparin  (LOVENOX ) injection 40 mg Start: 05/14/24 1000   Disposition: Likely home with home health.  Uncertain at this time  Status is: Inpatient Remains inpatient appropriate because: Pending clinical improvement, still on high flow nasal cannula oxygen,    Code Status:     Code Status: Full Code  Family Communication: None at bedside.  Consultants: Urology  Procedures: PICC line placement  Anti-infectives:  Rocephin  and Zithromax .  Anti-infectives (From admission, onward)    Start     Dose/Rate Route Frequency Ordered Stop   05/17/24 1115  azithromycin  (ZITHROMAX ) tablet 500 mg        500 mg Oral Daily 05/17/24 1027 05/18/24 0938   05/15/24 0000  cefTRIAXone  (ROCEPHIN ) 2 g in sodium chloride  0.9 % 100 mL IVPB        2 g 200 mL/hr over 30 Minutes Intravenous Every 24 hours 05/14/24 0628 05/19/24 1148   05/14/24 2200  azithromycin  (ZITHROMAX ) 500 mg in sodium chloride  0.9 % 250 mL IVPB  Status:  Discontinued        500 mg 250 mL/hr over 60 Minutes Intravenous Every 24 hours 05/14/24 0628 05/17/24 1027   05/14/24 0115  cefTRIAXone  (ROCEPHIN ) 2 g in sodium chloride  0.9 % 100 mL IVPB        2 g 200 mL/hr over 30 Minutes Intravenous  Once 05/14/24 0108 05/14/24 0251   05/14/24 0115  azithromycin  (ZITHROMAX ) 500 mg in sodium chloride  0.9 % 250 mL IVPB  500 mg 250 mL/hr over 60 Minutes Intravenous  Once 05/14/24 0108 05/14/24 0224      Subjective: Today, patient was seen and examined at bedside.  Patient states that she feels better with breathing.  Still has some cough with phlegm production but denies any chest pain palpitation fever or chills.  Was on high flow nasal cannula  which has been decreased at this morning to 10 L/min.  Objective: Vitals:   05/18/24 0739 05/18/24 0800  BP:  (!) 156/89  Pulse:  88  Resp:  19  Temp: 98.7 F (37.1 C)   SpO2:  94%    Intake/Output Summary (Last 24 hours) at 05/18/2024 1252 Last data filed at 05/18/2024 0100 Gross per 24 hour  Intake 100 ml  Output 350 ml  Net -250 ml   Filed Weights   05/14/24 1612 05/15/24 0438 05/17/24 0400  Weight: 95.1 kg 97.8 kg 98.1 kg   Body mass index is 40.86 kg/m.   Physical Exam:  GENERAL: Patient is alert awake and oriented. Not in obvious distress.  Obese build.  On high flow nasal cannula at 10 L. HENT: No scleral pallor or icterus. Pupils equally reactive to light. Oral mucosa is moist NECK: is supple, no gross swelling noted. CHEST: Diminished breath sounds bilaterally.  Coarse breath sounds noted. CVS: S1 and S2 heard, no murmur. Regular rate and rhythm.  ABDOMEN: Soft, non-tender, bowel sounds are present. EXTREMITIES: No edema. CNS: Cranial nerves are intact. No focal motor deficits. SKIN: warm and dry without rashes.  Data Review: I have personally reviewed the following laboratory data and studies,  CBC: Recent Labs  Lab 05/13/24 2347 05/15/24 0245 05/16/24 0251 05/17/24 0256 05/18/24 0717  WBC 14.3* 16.8* 12.5* 9.1 11.3*  NEUTROABS 11.8* 15.3*  --  7.6  --   HGB 14.3 13.5 13.7 14.3 15.4*  HCT 44.1 42.5 44.3 44.3 47.0*  MCV 89.3 90.6 92.1 90.6 89.7  PLT 247 282 288 304 340   Basic Metabolic Panel: Recent Labs  Lab 05/13/24 2347 05/14/24 0905 05/15/24 0245 05/16/24 0251 05/18/24 0717  NA 127* 128* 131* 130* 132*  K 4.0 4.3 4.5 5.0 4.7  CL 91* 92* 94* 93* 91*  CO2 26 24 27 29 30   GLUCOSE 107* 149* 143* 146* 119*  BUN 13 14 20 22 18   CREATININE 0.56 0.57 0.49 0.55 0.56  CALCIUM  9.6 9.5 9.3 9.3 9.7   Liver Function Tests: Recent Labs  Lab 05/14/24 0905  AST 16  ALT 13  ALKPHOS 73  BILITOT 0.4  PROT 6.9  ALBUMIN 3.6   No results for  input(s): LIPASE, AMYLASE in the last 168 hours. No results for input(s): AMMONIA in the last 168 hours. Cardiac Enzymes: No results for input(s): CKTOTAL, CKMB, CKMBINDEX, TROPONINI in the last 168 hours. BNP (last 3 results) No results for input(s): BNP in the last 8760 hours.  ProBNP (last 3 results) Recent Labs    05/14/24 0905  PROBNP 468.0*    CBG: No results for input(s): GLUCAP in the last 168 hours. Recent Results (from the past 240 hours)  Resp panel by RT-PCR (RSV, Flu A&B, Covid) Anterior Nasal Swab     Status: None   Collection Time: 05/13/24  1:20 AM   Specimen: Anterior Nasal Swab  Result Value Ref Range Status   SARS Coronavirus 2 by RT PCR NEGATIVE NEGATIVE Final    Comment: (NOTE) SARS-CoV-2 target nucleic acids are NOT DETECTED.  The SARS-CoV-2 RNA is generally detectable in  upper respiratory specimens during the acute phase of infection. The lowest concentration of SARS-CoV-2 viral copies this assay can detect is 138 copies/mL. A negative result does not preclude SARS-Cov-2 infection and should not be used as the sole basis for treatment or other patient management decisions. A negative result may occur with  improper specimen collection/handling, submission of specimen other than nasopharyngeal swab, presence of viral mutation(s) within the areas targeted by this assay, and inadequate number of viral copies(<138 copies/mL). A negative result must be combined with clinical observations, patient history, and epidemiological information. The expected result is Negative.  Fact Sheet for Patients:  BloggerCourse.com  Fact Sheet for Healthcare Providers:  SeriousBroker.it  This test is no t yet approved or cleared by the [place]  FDA and  has been authorized for detection and/or diagnosis of SARS-CoV-2 by FDA under an Emergency Use Authorization (EUA). This EUA will remain  in effect  (meaning this test can be used) for the duration of the COVID-19 declaration under Section 564(b)(1) of the Act, 21 U.S.C.section 360bbb-3(b)(1), unless the authorization is terminated  or revoked sooner.       Influenza A by PCR NEGATIVE NEGATIVE Final   Influenza B by PCR NEGATIVE NEGATIVE Final    Comment: (NOTE) The Xpert Xpress SARS-CoV-2/FLU/RSV plus assay is intended as an aid in the diagnosis of influenza from Nasopharyngeal swab specimens and should not be used as a sole basis for treatment. Nasal washings and aspirates are unacceptable for Xpert Xpress SARS-CoV-2/FLU/RSV testing.  Fact Sheet for Patients: BloggerCourse.com  Fact Sheet for Healthcare Providers: SeriousBroker.it  This test is not yet approved or cleared by the [place]  FDA and has been authorized for detection and/or diagnosis of SARS-CoV-2 by FDA under an Emergency Use Authorization (EUA). This EUA will remain in effect (meaning this test can be used) for the duration of the COVID-19 declaration under Section 564(b)(1) of the Act, 21 U.S.C. section 360bbb-3(b)(1), unless the authorization is terminated or revoked.     Resp Syncytial Virus by PCR NEGATIVE NEGATIVE Final    Comment: (NOTE) Fact Sheet for Patients: BloggerCourse.com  Fact Sheet for Healthcare Providers: SeriousBroker.it  This test is not yet approved or cleared by the [place]  FDA and has been authorized for detection and/or diagnosis of SARS-CoV-2 by FDA under an Emergency Use Authorization (EUA). This EUA will remain in effect (meaning this test can be used) for the duration of the COVID-19 declaration under Section 564(b)(1) of the Act, 21 U.S.C. section 360bbb-3(b)(1), unless the authorization is terminated or revoked.  Performed at [facility], [address], [place], [place] [address]   MRSA  Next Gen by PCR, Nasal     Status: None   Collection Time: 05/14/24  8:59 AM   Specimen: Nasal Mucosa; Nasal Swab  Result Value Ref Range Status   MRSA by PCR Next Gen NOT DETECTED NOT DETECTED Final    Comment: (NOTE) The GeneXpert MRSA Assay (FDA approved for NASAL specimens only), is one component of a comprehensive MRSA colonization surveillance program. It is not intended to diagnose MRSA infection nor to guide or monitor treatment for MRSA infections. Test performance is not FDA approved in patients less than 80 years old. Performed at [facility], [address] [place], [place] [address]   Expectorated Sputum Assessment w Gram Stain, Rflx to Resp Cult     Status: None   Collection Time: 05/14/24  4:44 PM   Specimen: Expectorated Sputum  Result Value  Ref Range Status   Specimen Description EXPECTORATED SPUTUM  Final   Special Requests NONE  Final   Sputum evaluation   Final    Sputum specimen not acceptable for testing.  Please recollect.   J Santiago,RN on 05/15/2024 at 1654 by [facility] Performed at [facility], [address], [place], [place] [address]    Report Status 05/15/2024 FINAL  Final  Respiratory (~20 pathogens) panel by PCR     Status: Abnormal   Collection Time: 05/15/24  9:30 AM   Specimen: Nasopharyngeal Swab; Respiratory  Result Value Ref Range Status   Adenovirus NOT DETECTED NOT DETECTED Final   Coronavirus 229E NOT DETECTED NOT DETECTED Final    Comment: (NOTE) The Coronavirus on the Respiratory Panel, DOES NOT test for the novel  Coronavirus (2019 nCoV)    Coronavirus HKU1 NOT DETECTED NOT DETECTED Final   Coronavirus NL63 NOT DETECTED NOT DETECTED Final   Coronavirus OC43 NOT DETECTED NOT DETECTED Final   Metapneumovirus NOT DETECTED NOT DETECTED Final   Rhinovirus / Enterovirus DETECTED (A) NOT DETECTED Final   Influenza A NOT DETECTED NOT DETECTED Final   Influenza B NOT DETECTED NOT DETECTED Final    Parainfluenza Virus 1 NOT DETECTED NOT DETECTED Final   Parainfluenza Virus 2 NOT DETECTED NOT DETECTED Final   Parainfluenza Virus 3 NOT DETECTED NOT DETECTED Final   Parainfluenza Virus 4 NOT DETECTED NOT DETECTED Final   Respiratory Syncytial Virus NOT DETECTED NOT DETECTED Final   Bordetella pertussis NOT DETECTED NOT DETECTED Final   Bordetella Parapertussis NOT DETECTED NOT DETECTED Final   Chlamydophila pneumoniae NOT DETECTED NOT DETECTED Final   Mycoplasma pneumoniae NOT DETECTED NOT DETECTED Final    Comment: Performed at [facility], [address] [place], [place] [address]     Studies: No results found.    Lovelee Forner, MD  [facility] 05/18/2024  If 7PM-7AM, please contact night-coverage

## 2024-05-19 DIAGNOSIS — J441 Chronic obstructive pulmonary disease with (acute) exacerbation: Secondary | ICD-10-CM | POA: Diagnosis not present

## 2024-05-19 LAB — CBC
HCT: 47.8 % — ABNORMAL HIGH (ref 36.0–46.0)
Hemoglobin: 15.6 g/dL — ABNORMAL HIGH (ref 12.0–15.0)
MCH: 29.1 pg (ref 26.0–34.0)
MCHC: 32.6 g/dL (ref 30.0–36.0)
MCV: 89 fL (ref 80.0–100.0)
Platelets: 371 K/uL (ref 150–400)
RBC: 5.37 MIL/uL — ABNORMAL HIGH (ref 3.87–5.11)
RDW: 12.6 % (ref 11.5–15.5)
WBC: 14.3 K/uL — ABNORMAL HIGH (ref 4.0–10.5)
nRBC: 0 % (ref 0.0–0.2)

## 2024-05-19 LAB — BASIC METABOLIC PANEL WITH GFR
Anion gap: 10 (ref 5–15)
BUN: 19 mg/dL (ref 8–23)
CO2: 30 mmol/L (ref 22–32)
Calcium: 9.6 mg/dL (ref 8.9–10.3)
Chloride: 91 mmol/L — ABNORMAL LOW (ref 98–111)
Creatinine, Ser: 0.53 mg/dL (ref 0.44–1.00)
GFR, Estimated: >60 mL/min (ref >60)
Glucose, Bld: 133 mg/dL — ABNORMAL HIGH (ref 70–99)
Potassium: 4.5 mmol/L (ref 3.5–5.1)
Sodium: 131 mmol/L — ABNORMAL LOW (ref 135–145)

## 2024-05-19 LAB — MAGNESIUM: Magnesium: 2.3 mg/dL (ref 1.7–2.4)

## 2024-05-19 MED ORDER — METHYLPREDNISOLONE SODIUM SUCC 125 MG IJ SOLR
60.0000 mg | INTRAMUSCULAR | Status: DC
  Administered 2024-05-20: 60 mg via INTRAVENOUS
  Filled 2024-05-19: qty 2

## 2024-05-19 NOTE — Evaluation (Addendum)
 Physical Therapy Evaluation Patient Details Name: Tammy Cowan MRN: [id number] DOB: 05/25/1956 Today's Date: 05/19/2024  History of Present Illness  68 y.o. female who was admitted due to acute hypoxic respiratory failure secondary to acute COPD exacerbation and asthma overlap syndrome in the setting of multifocal pneumonia. Pt with medical history significant of hypertension, hyperlipidemia, prediabetes, tobacco abuse, COPD and asthma overlap syndrome on monthly Nucala  injections, depression, chronic mild hyponatremia presented to the ED with shortness of breath, cough, wheezing, and hypoxemia.  Clinical Impression  Pt admitted with above diagnosis. Pt is independent with mobility at baseline, she works full time managing apartments. Today pt ambulated 140' without an assistive device, 2 standing rest breaks 2* 3/4 dyspnea, verbal cues for pursed lip breathing. SpO2 83% on room air walking, 87-90% on 8L O2 walking. Instructed pt in BUE/LE strengthening exercises to be performed independently to minimize deconditioning during hospitalization.  Pt currently with functional limitations due to the deficits listed below (see PT Problem List). Pt will benefit from acute skilled PT to increase their independence and safety with mobility to allow discharge.           If plan is discharge home, recommend the following: Assistance with cooking/housework;Assist for transportation;Help with stairs or ramp for entrance   Can travel by private vehicle        Equipment Recommendations None recommended by PT (pt declined RW)  Recommendations for Other Services       Functional Status Assessment Patient has had a recent decline in their functional status and demonstrates the ability to make significant improvements in function in a reasonable and predictable amount of time.     Precautions / Restrictions Precautions Precautions: Other (comment) Recall of Precautions/Restrictions:  Intact Precaution/Restrictions Comments: monitor O2 Restrictions Weight Bearing Restrictions Per Provider Order: No      Mobility  Bed Mobility               General bed mobility comments: up in recliner    Transfers Overall transfer level: Modified independent Equipment used: None               General transfer comment: used arm rests to power up    Ambulation/Gait Ambulation/Gait assistance: Supervision Gait Distance (Feet): 140 Feet Assistive device: None Gait Pattern/deviations: Step-through pattern, Decreased stride length Gait velocity: WNL     General Gait Details: steady, no loss of balance, 2 standing rest breaks 2* 3/4 dyspnea; SpO2 83% room air walking, 87-90% 8L walking  Stairs            Wheelchair Mobility     Tilt Bed    Modified Rankin (Stroke Patients Only)       Balance Overall balance assessment: Independent                                           Pertinent Vitals/Pain Pain Assessment Pain Assessment: No/denies pain    Home Living Family/patient expects to be discharged to:: Private residence Living Arrangements: Spouse/significant other;Other relatives Available Help at Discharge: Family;Available 24 hours/day   Home Access: Stairs to enter Entrance Stairs-Rails: Left Entrance Stairs-Number of Steps: 3   Home Layout: One level Home Equipment: None Additional Comments: works full time managing apartment complexes; spouse has dementia, 91 y.o. grandson lives with pt and helps with pt's spouse    Prior Function Prior Level of Function : Independent/Modified Independent;Driving  Mobility Comments: walks without AD, no falls in past 6 months ADLs Comments: independent     Extremity/Trunk Assessment   Upper Extremity Assessment Upper Extremity Assessment: Overall WFL for tasks assessed    Lower Extremity Assessment Lower Extremity Assessment: Overall WFL for tasks assessed     Cervical / Trunk Assessment Cervical / Trunk Assessment: Normal  Communication   Communication Communication: No apparent difficulties    Cognition Arousal: Alert Behavior During Therapy: WFL for tasks assessed/performed   PT - Cognitive impairments: No apparent impairments                         Following commands: Intact       Cueing Cueing Techniques: Verbal cues     General Comments      Exercises General Exercises - Upper Extremity Shoulder Flexion: AROM, Both, 10 reps, Seated General Exercises - Lower Extremity Ankle Circles/Pumps: AROM, Both, 15 reps, Supine Short Arc Quad: AROM, Both, 10 reps, Supine   Assessment/Plan    PT Assessment Patient needs continued PT services  PT Problem List Decreased activity tolerance;Cardiopulmonary status limiting activity       PT Treatment Interventions Gait training;Therapeutic exercise;Therapeutic activities    PT Goals (Current goals can be found in the Care Plan section)  Acute Rehab PT Goals Patient Stated Goal: return to work, play with 18 m.o. grandchild PT Goal Formulation: With patient Time For Goal Achievement: 06/02/24 Potential to Achieve Goals: Good    Frequency Min 3X/week     Co-evaluation               AM-PAC PT 6 Clicks Mobility  Outcome Measure Help needed turning from your back to your side while in a flat bed without using bedrails?: None Help needed moving from lying on your back to sitting on the side of a flat bed without using bedrails?: None Help needed moving to and from a bed to a chair (including a wheelchair)?: None Help needed standing up from a chair using your arms (e.g., wheelchair or bedside chair)?: None Help needed to walk in hospital room?: None Help needed climbing 3-5 steps with a railing? : A Little 6 Click Score: 23    End of Session Equipment Utilized During Treatment: Oxygen Activity Tolerance: Patient limited by fatigue Patient left: in  chair;with chair alarm set;with call bell/phone within reach Nurse Communication: Mobility status PT Visit Diagnosis: Difficulty in walking, not elsewhere classified (R26.2)    Time: 9041-8971 PT Time Calculation (min) (ACUTE ONLY): 30 min   Charges:   PT Evaluation $PT Eval Moderate Complexity: 1 Mod PT Treatments $Gait Training: 8-22 mins PT General Charges $$ ACUTE PT VISIT: 1 Visit         Sylvan Delon Copp PT 05/19/2024  Acute Rehabilitation Services  Office [phone]

## 2024-05-19 NOTE — Progress Notes (Signed)
 PROGRESS NOTE  KYTZIA GIENGER FMW:[id number] DOB: March 21, 1956 DOA: 05/13/2024 PCP: Wallie Drafts, NP   LOS: 5 days   Brief narrative:  Tammy Cowan is a 68 y.o. 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    Assessment/Plan: Principal Problem:   COPD exacerbation (HCC) Active Problems:   Acute hypoxemic respiratory failure (HCC)   Multifocal pneumonia   Depression   Essential hypertension  Acute hypoxemic respiratory failure secondary to acute exacerbation of COPD-asthma overlap syndrome in the setting of multifocal pneumonia Rhinovirus infection. Chest x-ray showing increasing infiltrates in the lung bases suspicious for multifocal pneumonia or less likely edema.  COVID/influenza/RSV PCR negative.  RVP with rhinovirus infection.  Patient clinically feeling much better.  Cultures negative.  Chest x-ray repeated showed improvement.  Was initially on 45 L of high flow nasal cannula oxygen.  This has been decreased to 5 L/min.  Continue frequent nebulization, inhaled steroids, will decrease IV Solu Medrol  60 mg every 24 hours..  Continue Rocephin  and Zithromax .  Continue incentive spirometry flutter valve and Mucinex .  Will continue to wean oxygen as able.  Patient is requiring 8 L of oxygen on ambulation.   Pulmonary edema: Resolved.  2D echocardiogram with preserved LV function.  Received 2 doses of IV Lasix during hospitalization.  Patient is overall positive balance for 1739 mL.   Hypertension Continue valsartan  and HCTZ.. Latest blood pressure of 140/82   Hyperlipidemia Continue Zetia.    History of prediabetes Latest hemoglobin A1c of 5.6.   Tobacco abuse Has not smoked in a week.  Continue Wellbutrin.   History of depression Continue Wellbutrin, BuSpar , and Zoloft .   Chronic mild hyponatremia Sodium 131 today.  Previously sodium levels between 128- 134.   Chronic allergic rhinitis Continue Flonase  nasal spray, Xyzal, and Singulair .  Generalized weakness.  PT has seen the patient no therapy needs.   DVT prophylaxis: enoxaparin  (LOVENOX ) injection 40 mg Start: 05/14/24 1000   Disposition: Home likely in 1 to 2 days   Status is: Inpatient Remains inpatient appropriate because: Pending clinical improvement, oxygen,    Code Status:     Code Status: Full Code  Family Communication: None at bedside.  Consultants:   Procedures: PICC line placement  Anti-infectives:  Rocephin  and Zithromax -will complete course today  Anti-infectives (From admission, onward)    Start     Dose/Rate Route Frequency Ordered Stop   05/17/24 1115  azithromycin  (ZITHROMAX ) tablet 500 mg        500 mg Oral Daily 05/17/24 1027 05/18/24 0938   05/15/24 0000  cefTRIAXone  (ROCEPHIN ) 2 g in sodium chloride  0.9 % 100 mL IVPB        2 g 200 mL/hr over 30 Minutes Intravenous Every 24 hours 05/14/24 0628 05/19/24 0213   05/14/24 2200  azithromycin  (ZITHROMAX ) 500 mg in sodium chloride  0.9 % 250 mL IVPB  Status:  Discontinued        500 mg 250 mL/hr over 60 Minutes Intravenous Every 24 hours 05/14/24 0628 05/17/24 1027   05/14/24 0115  cefTRIAXone  (ROCEPHIN ) 2 g in sodium chloride  0.9 % 100 mL IVPB        2 g 200 mL/hr over 30 Minutes Intravenous  Once 05/14/24 0108 05/14/24 0251   05/14/24 0115  azithromycin  (ZITHROMAX ) 500 mg in sodium chloride  0.9 % 250 mL IVPB        500 mg 250 mL/hr over 60 Minutes Intravenous  Once 05/14/24 0108 05/14/24 0224      Subjective: Today, patient was seen and examined at bedside.  Patient states that she feels better with breathing.  Still has  some cough with phlegm production but denies any chest pain palpitation fever or chills.  Was on high flow nasal cannula which has been decreased at this morning to 10 L/min.  Objective: Vitals:   05/19/24 1135 05/19/24 1400  BP:  (!) 140/82  Pulse:  83  Resp:  16  Temp: 97.9 F (36.6 C)   SpO2:  95%    Intake/Output Summary (Last 24 hours) at 05/19/2024 1531 Last data filed at 05/19/2024 0335 Gross per 24 hour  Intake 100 ml  Output --  Net 100 ml   Filed Weights   05/14/24 1612 05/15/24 0438 05/17/24 0400  Weight: 95.1 kg 97.8 kg 98.1 kg   Body mass index is 40.86 kg/m.   Physical Exam:  GENERAL: Patient is alert awake and oriented. Not in obvious distress.  Obese build.  On high flow nasal cannula at 5 L/min. HENT: No scleral pallor or icterus. Pupils equally reactive to light. Oral mucosa is moist NECK: is supple, no gross swelling noted. CHEST: Diminished breath sounds bilaterally.  Coarse breath sounds noted. CVS: S1 and S2 heard, no murmur. Regular rate and rhythm.  ABDOMEN: Soft, non-tender, bowel sounds are present. EXTREMITIES: No edema. CNS: Cranial nerves are intact. No focal motor deficits. SKIN: warm and dry without rashes.  Data Review: I have personally reviewed the following laboratory data and studies,  CBC: Recent Labs  Lab 05/13/24 2347 05/15/24 0245 05/16/24 0251 05/17/24 0256 05/18/24 0717 05/19/24 0304  WBC 14.3* 16.8* 12.5* 9.1 11.3* 14.3*  NEUTROABS 11.8* 15.3*  --  7.6  --   --   HGB 14.3 13.5 13.7 14.3 15.4* 15.6*  HCT 44.1 42.5 44.3 44.3 47.0* 47.8*  MCV 89.3 90.6 92.1 90.6 89.7 89.0  PLT 247 282 288 304 340 371   Basic Metabolic Panel: Recent Labs  Lab 05/14/24 0905 05/15/24 0245 05/16/24 0251 05/18/24 0717 05/19/24 0304  NA 128* 131* 130* 132* 131*  K 4.3 4.5 5.0 4.7 4.5  CL 92* 94* 93* 91* 91*  CO2 24 27 29 30 30   GLUCOSE 149* 143* 146* 119* 133*  BUN 14 20 22 18 19   CREATININE 0.57 0.49 0.55 0.56 0.53  CALCIUM  9.5  9.3 9.3 9.7 9.6  MG  --   --   --   --  2.3   Liver Function Tests: Recent Labs  Lab 05/14/24 0905  AST 16  ALT 13  ALKPHOS 73  BILITOT 0.4  PROT 6.9  ALBUMIN 3.6   No results for input(s): LIPASE, AMYLASE in the last 168 hours. No results for input(s): AMMONIA in the last 168 hours. Cardiac Enzymes: No results for input(s): CKTOTAL, CKMB, CKMBINDEX, TROPONINI in the last 168 hours. BNP (last 3 results) No results for input(s): BNP in the last 8760 hours.  ProBNP (last 3 results) Recent Labs    05/14/24 0905  PROBNP 468.0*    CBG: No results for input(s): GLUCAP in the last 168 hours. Recent Results (from the past 240 hours)  Resp panel by RT-PCR (RSV, Flu A&B, Covid) Anterior Nasal Swab  Status: None   Collection Time: 05/13/24  1:20 AM   Specimen: Anterior Nasal Swab  Result Value Ref Range Status   SARS Coronavirus 2 by RT PCR NEGATIVE NEGATIVE Final    Comment: (NOTE) SARS-CoV-2 target nucleic acids are NOT DETECTED.  The SARS-CoV-2 RNA is generally detectable in upper respiratory specimens during the acute phase of infection. The lowest concentration of SARS-CoV-2 viral copies this assay can detect is 138 copies/mL. A negative result does not preclude SARS-Cov-2 infection and should not be used as the sole basis for treatment or other patient management decisions. A negative result may occur with  improper specimen collection/handling, submission of specimen other than nasopharyngeal swab, presence of viral mutation(s) within the areas targeted by this assay, and inadequate number of viral copies(<138 copies/mL). A negative result must be combined with clinical observations, patient history, and epidemiological information. The expected result is Negative.  Fact Sheet for Patients:  BloggerCourse.com  Fact Sheet for Healthcare Providers:  SeriousBroker.it  This test is no t yet  approved or cleared by the [place]  FDA and  has been authorized for detection and/or diagnosis of SARS-CoV-2 by FDA under an Emergency Use Authorization (EUA). This EUA will remain  in effect (meaning this test can be used) for the duration of the COVID-19 declaration under Section 564(b)(1) of the Act, 21 U.S.C.section 360bbb-3(b)(1), unless the authorization is terminated  or revoked sooner.       Influenza A by PCR NEGATIVE NEGATIVE Final   Influenza B by PCR NEGATIVE NEGATIVE Final    Comment: (NOTE) The Xpert Xpress SARS-CoV-2/FLU/RSV plus assay is intended as an aid in the diagnosis of influenza from Nasopharyngeal swab specimens and should not be used as a sole basis for treatment. Nasal washings and aspirates are unacceptable for Xpert Xpress SARS-CoV-2/FLU/RSV testing.  Fact Sheet for Patients: BloggerCourse.com  Fact Sheet for Healthcare Providers: SeriousBroker.it  This test is not yet approved or cleared by the [place]  FDA and has been authorized for detection and/or diagnosis of SARS-CoV-2 by FDA under an Emergency Use Authorization (EUA). This EUA will remain in effect (meaning this test can be used) for the duration of the COVID-19 declaration under Section 564(b)(1) of the Act, 21 U.S.C. section 360bbb-3(b)(1), unless the authorization is terminated or revoked.     Resp Syncytial Virus by PCR NEGATIVE NEGATIVE Final    Comment: (NOTE) Fact Sheet for Patients: BloggerCourse.com  Fact Sheet for Healthcare Providers: SeriousBroker.it  This test is not yet approved or cleared by the [place]  FDA and has been authorized for detection and/or diagnosis of SARS-CoV-2 by FDA under an Emergency Use Authorization (EUA). This EUA will remain in effect (meaning this test can be used) for the duration of the COVID-19 declaration under Section 564(b)(1) of  the Act, 21 U.S.C. section 360bbb-3(b)(1), unless the authorization is terminated or revoked.  Performed at [facility], [address], [place], [place] [address]   MRSA Next Gen by PCR, Nasal     Status: None   Collection Time: 05/14/24  8:59 AM   Specimen: Nasal Mucosa; Nasal Swab  Result Value Ref Range Status   MRSA by PCR Next Gen NOT DETECTED NOT DETECTED Final    Comment: (NOTE) The GeneXpert MRSA Assay (FDA approved for NASAL specimens only), is one component of a comprehensive MRSA colonization surveillance program. It is not intended to diagnose MRSA infection nor to guide or monitor treatment for MRSA infections. Test performance is not FDA approved in  patients less than 27 years old. Performed at [facility], [address] [place], [place] [address]   Expectorated Sputum Assessment w Gram Stain, Rflx to Resp Cult     Status: None   Collection Time: 05/14/24  4:44 PM   Specimen: Expectorated Sputum  Result Value Ref Range Status   Specimen Description EXPECTORATED SPUTUM  Final   Special Requests NONE  Final   Sputum evaluation   Final    Sputum specimen not acceptable for testing.  Please recollect.   J Santiago,RN on 05/15/2024 at 1654 by [facility] Performed at [facility], [address] [place], [place] [address]    Report Status 05/15/2024 FINAL  Final  Respiratory (~20 pathogens) panel by PCR     Status: Abnormal   Collection Time: 05/15/24  9:30 AM   Specimen: Nasopharyngeal Swab; Respiratory  Result Value Ref Range Status   Adenovirus NOT DETECTED NOT DETECTED Final   Coronavirus 229E NOT DETECTED NOT DETECTED Final    Comment: (NOTE) The Coronavirus on the Respiratory Panel, DOES NOT test for the novel  Coronavirus (2019 nCoV)    Coronavirus HKU1 NOT DETECTED NOT DETECTED Final   Coronavirus NL63 NOT DETECTED NOT DETECTED Final   Coronavirus OC43 NOT DETECTED NOT DETECTED Final    Metapneumovirus NOT DETECTED NOT DETECTED Final   Rhinovirus / Enterovirus DETECTED (A) NOT DETECTED Final   Influenza A NOT DETECTED NOT DETECTED Final   Influenza B NOT DETECTED NOT DETECTED Final   Parainfluenza Virus 1 NOT DETECTED NOT DETECTED Final   Parainfluenza Virus 2 NOT DETECTED NOT DETECTED Final   Parainfluenza Virus 3 NOT DETECTED NOT DETECTED Final   Parainfluenza Virus 4 NOT DETECTED NOT DETECTED Final   Respiratory Syncytial Virus NOT DETECTED NOT DETECTED Final   Bordetella pertussis NOT DETECTED NOT DETECTED Final   Bordetella Parapertussis NOT DETECTED NOT DETECTED Final   Chlamydophila pneumoniae NOT DETECTED NOT DETECTED Final   Mycoplasma pneumoniae NOT DETECTED NOT DETECTED Final    Comment: Performed at [facility], [address] [place], [place] [address]     Studies: No results found.    Vernal Alstrom, MD  [facility] 05/19/2024  If 7PM-7AM, please contact night-coverage

## 2024-05-19 NOTE — Progress Notes (Signed)
 SATURATION QUALIFICATIONS: (This note is used to comply with regulatory documentation for home oxygen)  Patient Saturations on Room Air at Rest = 87%  Patient Saturations on Room Air while Ambulating = 83%  Patient Saturations on 8 Liters of oxygen while Ambulating = 87-90%  Please briefly explain why patient needs home oxygen: to maintain appropriate SpO2 levels.   Sylvan Nest Kistler PT 05/19/2024  Acute Rehabilitation Services  Office [phone]

## 2024-05-20 ENCOUNTER — Other Ambulatory Visit (HOSPITAL_COMMUNITY): Payer: Self-pay

## 2024-05-20 DIAGNOSIS — J441 Chronic obstructive pulmonary disease with (acute) exacerbation: Secondary | ICD-10-CM | POA: Diagnosis not present

## 2024-05-20 MED ORDER — NICOTINE 14 MG/24HR TD PT24
14.0000 mg | MEDICATED_PATCH | Freq: Every day | TRANSDERMAL | 2 refills | Status: AC
  Filled 2024-05-20: qty 14, 14d supply, fill #0
  Filled 2024-05-20: qty 28, 28d supply, fill #0

## 2024-05-20 MED ORDER — PREDNISONE 10 MG PO TABS
ORAL_TABLET | ORAL | 0 refills | Status: DC
  Filled 2024-05-20: qty 30, 12d supply, fill #0

## 2024-05-20 MED ORDER — BREZTRI AEROSPHERE 160-9-4.8 MCG/ACT IN AERO
2.0000 | INHALATION_SPRAY | Freq: Two times a day (BID) | RESPIRATORY_TRACT | 11 refills | Status: DC
  Filled 2024-05-20: qty 10.7, 30d supply, fill #0

## 2024-05-20 MED ORDER — GUAIFENESIN ER 600 MG PO TB12
600.0000 mg | ORAL_TABLET | Freq: Two times a day (BID) | ORAL | 0 refills | Status: AC
  Filled 2024-05-20: qty 20, 10d supply, fill #0

## 2024-05-20 NOTE — Progress Notes (Signed)
 This RN transported pt to self/private vehicle operated by her grandson. Pt denies SOB, doesn't appear to be in apparent distress, and is joyful to be going home.

## 2024-05-20 NOTE — Discharge Summary (Signed)
 Physician Discharge Summary  [name] FMW:[id number] DOB: 03/19/1956 DOA: 05/13/2024  PCP: [name], [name], NP  Admit date: 05/13/2024 Discharge date: 05/20/2024  Admitted From: Home  Discharge disposition: Home   Recommendations for Outpatient Follow-Up:   Follow up with your primary care provider in one week.  Check CBC, BMP, magnesium  in the next visit Patient has been prescribed oxygen at this time and please reassess the need in the next visit. Patient should be encouraged to quit smoking.   Discharge Diagnosis:   Principal Problem:   COPD exacerbation (HCC) Active Problems:   Acute hypoxemic respiratory failure (HCC)   Multifocal pneumonia   Depression   Essential hypertension    Discharge Condition: Improved.  Diet recommendation: Low sodium, heart healthy.   Wound care: None.  Code status: Full.   History of Present Illness:   Tammy Cowan is a 68 y.o. 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     Hospital Course:   Following conditions were addressed during hospitalization as listed below,  Acute hypoxemic respiratory failure secondary to acute exacerbation of COPD-asthma overlap syndrome in the setting of multifocal pneumonia Rhinovirus infection. Chest x-ray showed increasing infiltrates in the lung bases suspicious for multifocal pneumonia or less likely edema.  COVID/influenza/RSV PCR negative.  RVP with rhinovirus infection.  Patient clinically feeling much better.  Blood cultures negative.  Chest x-ray repeated showed improvement.  Was  initially on 45 L of high flow nasal cannula oxygen.  This has been decreased to 3 L/min on discharge.  During hospitalization, patient received frequent nebulization, inhaled steroids, IV Solu-Medrol , Rocephin  and Zithromax , incentive spirometry flutter valve and Mucinex .    Pulmonary edema: Resolved.  2D echocardiogram with preserved LV function.  Received  IV Lasix during hospitalization.    Hypertension Continue valsartan  and HCTZ.. Latest blood pressure of 140/82   Hyperlipidemia Continue Zetia.   History of prediabetes Latest hemoglobin A1c of 5.6.   Tobacco abuse Has not smoked in a week.  Continue Wellbutrin. Cessation advised   History of depression Continue Wellbutrin, BuSpar , and Zoloft .   Chronic mild hyponatremia Sodium 132 today.  Previously sodium levels between 128- 134.   Chronic allergic rhinitis Continue Flonase  nasal spray, Xyzal, and Singulair .   Generalized weakness.  PT has seen the patient and no therapy needs.   Disposition.  At this time, patient is stable for disposition home with outpatient PCP follow-up.  Medical Consultants:   None  Procedures:    PICC line placement Subjective:   Today, patient was seen and examined at bedside.  Denies any nausea, vomiting, fever, shortness of breath, dyspnea chest pain or shortness of breath feels much better and wants to go home.  Discharge Exam:   Vitals:   05/20/24 1000 05/20/24 1007  BP:    Pulse: 93 93  Resp: (!) 9 (!) 21  Temp:    SpO2: (!) 84% 92%   Vitals:   05/20/24 0916 05/20/24 0943 05/20/24 1000 05/20/24 1007  BP:  115/80    Pulse:  81 93 93  Resp:   (!) 9 (!) 21  Temp: (!) 97.5 F (36.4 C)     TempSrc: Oral  SpO2:  91% (!) 84% 92%  Weight:      Height:        General: Alert awake, not in obvious distress, on 3 L of oxygen by nasal cannula, obese built, HENT: pupils equally reacting to light,  No scleral pallor or icterus noted. Oral mucosa is moist.  Chest:  .   Diminished breath sounds bilaterally.  Coarse breath sounds noted. CVS: S1 &S2 heard. No murmur.  Regular rate and rhythm. Abdomen: Soft, nontender, nondistended.  Bowel sounds are heard.   Extremities: No cyanosis, clubbing or edema.  Peripheral pulses are palpable. Psych: Alert, awake and oriented, normal mood CNS:  No cranial nerve deficits.  Power equal in all extremities.   Skin: Warm and dry.  No rashes noted.  The results of significant diagnostics from this hospitalization (including imaging, microbiology, ancillary and laboratory) are listed below for reference.     Diagnostic Studies:   DG Chest Port 1 View Result Date: 05/13/2024 CLINICAL DATA:  Chronic cough and shortness of breath. EXAM: PORTABLE CHEST 1 VIEW COMPARISON:  None 12/22/2023 FINDINGS: Cardiac enlargement. Emphysematous changes in the lungs. Increasing infiltration in the lung bases may represent multifocal pneumonia or less likely edema. No pleural effusion or pneumothorax. Mediastinal contours appear intact. Calcification of the aorta. IMPRESSION: Increasing infiltrates in the lung bases likely to represent pneumonia. Cardiac enlargement. Emphysematous changes in the lungs. Electronically Signed   By: Elsie Gravely M.D.   On: 05/13/2024 23:59     Labs:   Basic Metabolic Panel: Recent Labs  Lab 05/14/24 0905 05/15/24 0245 05/16/24 0251 05/18/24 0717 05/19/24 0304  NA 128* 131* 130* 132* 131*  K 4.3 4.5 5.0 4.7 4.5  CL 92* 94* 93* 91* 91*  CO2 24 27 29 30 30   GLUCOSE 149* 143* 146* 119* 133*  BUN 14 20 22 18 19   CREATININE 0.57 0.49 0.55 0.56 0.53  CALCIUM  9.5 9.3 9.3 9.7 9.6  MG  --   --   --   --  2.3   GFR Estimated Creatinine Clearance: 75.1 mL/min (by C-G formula based on SCr of 0.53 mg/dL). Liver Function Tests: Recent Labs  Lab 05/14/24 0905  AST 16  ALT 13  ALKPHOS 73  BILITOT 0.4  PROT 6.9  ALBUMIN 3.6   No results for input(s): LIPASE, AMYLASE in the last 168 hours. No  results for input(s): AMMONIA in the last 168 hours. Coagulation profile No results for input(s): INR, PROTIME in the last 168 hours.  CBC: Recent Labs  Lab 05/13/24 2347 05/15/24 0245 05/16/24 0251 05/17/24 0256 05/18/24 0717 05/19/24 0304  WBC 14.3* 16.8* 12.5* 9.1 11.3* 14.3*  NEUTROABS 11.8* 15.3*  --  7.6  --   --   HGB 14.3 13.5 13.7 14.3 15.4* 15.6*  HCT 44.1 42.5 44.3 44.3 47.0* 47.8*  MCV 89.3 90.6 92.1 90.6 89.7 89.0  PLT 247 282 288 304 340 371   Cardiac Enzymes: No results for input(s): CKTOTAL, CKMB, CKMBINDEX, TROPONINI in the last 168 hours. BNP: Invalid input(s): POCBNP CBG: No results for input(s): GLUCAP in the last 168 hours. D-Dimer No results for input(s): DDIMER in the last 72 hours. Hgb A1c No results for input(s): HGBA1C in the last 72 hours. Lipid Profile No results for input(s): CHOL, HDL, LDLCALC, TRIG, CHOLHDL, LDLDIRECT in the last 72 hours. Thyroid function studies No results for input(s): TSH, T4TOTAL, T3FREE, THYROIDAB in the last 72 hours.  Invalid input(s): FREET3 Anemia work up No results for input(s):  VITAMINB12, FOLATE, FERRITIN, TIBC, IRON, RETICCTPCT in the last 72 hours. Microbiology Recent Results (from the past 240 hours)  Resp panel by RT-PCR (RSV, Flu A&B, Covid) Anterior Nasal Swab     Status: None   Collection Time: 05/13/24  1:20 AM   Specimen: Anterior Nasal Swab  Result Value Ref Range Status   SARS Coronavirus 2 by RT PCR NEGATIVE NEGATIVE Final    Comment: (NOTE) SARS-CoV-2 target nucleic acids are NOT DETECTED.  The SARS-CoV-2 RNA is generally detectable in upper respiratory specimens during the acute phase of infection. The lowest concentration of SARS-CoV-2 viral copies this assay can detect is 138 copies/mL. A negative result does not preclude SARS-Cov-2 infection and should not be used as the sole basis for treatment or other patient management  decisions. A negative result may occur with  improper specimen collection/handling, submission of specimen other than nasopharyngeal swab, presence of viral mutation(s) within the areas targeted by this assay, and inadequate number of viral copies(<138 copies/mL). A negative result must be combined with clinical observations, patient history, and epidemiological information. The expected result is Negative.  Fact Sheet for Patients:  BloggerCourse.com  Fact Sheet for Healthcare Providers:  SeriousBroker.it  This test is no t yet approved or cleared by the [place]  FDA and  has been authorized for detection and/or diagnosis of SARS-CoV-2 by FDA under an Emergency Use Authorization (EUA). This EUA will remain  in effect (meaning this test can be used) for the duration of the COVID-19 declaration under Section 564(b)(1) of the Act, 21 U.S.C.section 360bbb-3(b)(1), unless the authorization is terminated  or revoked sooner.       Influenza A by PCR NEGATIVE NEGATIVE Final   Influenza B by PCR NEGATIVE NEGATIVE Final    Comment: (NOTE) The Xpert Xpress SARS-CoV-2/FLU/RSV plus assay is intended as an aid in the diagnosis of influenza from Nasopharyngeal swab specimens and should not be used as a sole basis for treatment. Nasal washings and aspirates are unacceptable for Xpert Xpress SARS-CoV-2/FLU/RSV testing.  Fact Sheet for Patients: BloggerCourse.com  Fact Sheet for Healthcare Providers: SeriousBroker.it  This test is not yet approved or cleared by the [place]  FDA and has been authorized for detection and/or diagnosis of SARS-CoV-2 by FDA under an Emergency Use Authorization (EUA). This EUA will remain in effect (meaning this test can be used) for the duration of the COVID-19 declaration under Section 564(b)(1) of the Act, 21 U.S.C. section 360bbb-3(b)(1), unless the  authorization is terminated or revoked.     Resp Syncytial Virus by PCR NEGATIVE NEGATIVE Final    Comment: (NOTE) Fact Sheet for Patients: BloggerCourse.com  Fact Sheet for Healthcare Providers: SeriousBroker.it  This test is not yet approved or cleared by the [place]  FDA and has been authorized for detection and/or diagnosis of SARS-CoV-2 by FDA under an Emergency Use Authorization (EUA). This EUA will remain in effect (meaning this test can be used) for the duration of the COVID-19 declaration under Section 564(b)(1) of the Act, 21 U.S.C. section 360bbb-3(b)(1), unless the authorization is terminated or revoked.  Performed at [facility], [address], [place], [place] [address]   MRSA Next Gen by PCR, Nasal     Status: None   Collection Time: 05/14/24  8:59 AM   Specimen: Nasal Mucosa; Nasal Swab  Result Value Ref Range Status   MRSA by PCR Next Gen NOT DETECTED NOT DETECTED Final    Comment: (NOTE) The GeneXpert MRSA Assay (FDA approved for NASAL specimens  only), is one component of a comprehensive MRSA colonization surveillance program. It is not intended to diagnose MRSA infection nor to guide or monitor treatment for MRSA infections. Test performance is not FDA approved in patients less than 110 years old. Performed at [facility], [address] [place], [place] [address]   Expectorated Sputum Assessment w Gram Stain, Rflx to Resp Cult     Status: None   Collection Time: 05/14/24  4:44 PM   Specimen: Expectorated Sputum  Result Value Ref Range Status   Specimen Description EXPECTORATED SPUTUM  Final   Special Requests NONE  Final   Sputum evaluation   Final    Sputum specimen not acceptable for testing.  Please recollect.   J Santiago,RN on 05/15/2024 at 1654 by [facility] Performed at [facility], [address], [place], [place] [address]     Report Status 05/15/2024 FINAL  Final  Respiratory (~20 pathogens) panel by PCR     Status: Abnormal   Collection Time: 05/15/24  9:30 AM   Specimen: Nasopharyngeal Swab; Respiratory  Result Value Ref Range Status   Adenovirus NOT DETECTED NOT DETECTED Final   Coronavirus 229E NOT DETECTED NOT DETECTED Final    Comment: (NOTE) The Coronavirus on the Respiratory Panel, DOES NOT test for the novel  Coronavirus (2019 nCoV)    Coronavirus HKU1 NOT DETECTED NOT DETECTED Final   Coronavirus NL63 NOT DETECTED NOT DETECTED Final   Coronavirus OC43 NOT DETECTED NOT DETECTED Final   Metapneumovirus NOT DETECTED NOT DETECTED Final   Rhinovirus / Enterovirus DETECTED (A) NOT DETECTED Final   Influenza A NOT DETECTED NOT DETECTED Final   Influenza B NOT DETECTED NOT DETECTED Final   Parainfluenza Virus 1 NOT DETECTED NOT DETECTED Final   Parainfluenza Virus 2 NOT DETECTED NOT DETECTED Final   Parainfluenza Virus 3 NOT DETECTED NOT DETECTED Final   Parainfluenza Virus 4 NOT DETECTED NOT DETECTED Final   Respiratory Syncytial Virus NOT DETECTED NOT DETECTED Final   Bordetella pertussis NOT DETECTED NOT DETECTED Final   Bordetella Parapertussis NOT DETECTED NOT DETECTED Final   Chlamydophila pneumoniae NOT DETECTED NOT DETECTED Final   Mycoplasma pneumoniae NOT DETECTED NOT DETECTED Final    Comment: Performed at [facility], [address], [place], [place] [address]     Discharge Instructions:   Discharge Instructions     Call MD for:  temperature >100.4   Complete by: As directed    Diet - low sodium heart healthy   Complete by: As directed    Discharge instructions   Complete by: As directed    Follow up with your primary care provider in one week. Complete the course of prednisone . Use oxygen as prescribed. No overexertion. Check need for oxygen in the next visit with your primary care provider. Seek medical attention for worsening symptoms.   Increase activity slowly   Complete  by: As directed       Allergies as of 05/20/2024       Reactions   Crestor [rosuvastatin Calcium ] Diarrhea   Pt states diarrhea for months after taking crestor   Venlafaxine Other (See Comments)   Felt bad on it   Vibegron         Medication List     STOP taking these medications    fluconazole  100 MG tablet Commonly known as: Diflucan    ipratropium 0.02 % nebulizer solution Commonly known as: ATROVENT        TAKE these medications  albuterol  108 (90 Base) MCG/ACT inhaler Commonly known as: VENTOLIN  HFA Inhale 2 puffs into the lungs every 4 (four) hours as needed for wheezing or shortness of breath.   Breztri  Aerosphere 160-9-4.8 MCG/ACT Aero inhaler Generic drug: budesonide -glycopyrrolate-formoterol Inhale 2 puffs into the lungs in the morning and at bedtime. What changed: Another medication with the same name was removed. Continue taking this medication, and follow the directions you see here.   budesonide  0.5 MG/2ML nebulizer solution Commonly known as: PULMICORT  Dissolve 2mL in sinus rinse bottle, use twice daily.   buPROPion 150 MG 12 hr tablet Commonly known as: WELLBUTRIN SR Take 150 mg by mouth 2 (two) times daily.   busPIRone  10 MG tablet Commonly known as: BUSPAR  Take 10 mg by mouth 3 (three) times daily.   ezetimibe 10 MG tablet Commonly known as: ZETIA Take 10 mg by mouth daily.   fluticasone  50 MCG/ACT nasal spray Commonly known as: FLONASE  Place 1 spray into both nostrils in the morning and at bedtime.   guaiFENesin  600 MG 12 hr tablet Commonly known as: MUCINEX  Take 1 tablet (600 mg total) by mouth 2 (two) times daily for 7 days.   ipratropium 0.06 % nasal spray Commonly known as: ATROVENT  Use 1 sprays in each nostril up to 4 times daily   ipratropium-albuterol  0.5-2.5 (3) MG/3ML Soln Commonly known as: DUONEB Take 3 mLs by nebulization every 6 (six) hours as needed.   levocetirizine 5 MG tablet Commonly known as: XYZAL Take  5 mg by mouth every evening.   montelukast  10 MG tablet Commonly known as: SINGULAIR  Take one tablet once daily   nicotine  14 mg/24hr patch Commonly known as: NICODERM CQ  - dosed in mg/24 hours Place 1 patch (14 mg total) onto the skin daily.   Nucala  100 MG/ML Soaj Generic drug: Mepolizumab  Inject 100 mg into the skin every 28 (twenty-eight) days.   nystatin  100000 UNIT/ML suspension Commonly known as: MYCOSTATIN  Take 5 mLs (500,000 Units total) by mouth 4 (four) times daily.   predniSONE  10 MG tablet Commonly known as: DELTASONE  4 tabs for 3 days, then 3 tabs for 3 days, 2 tabs for 3 days, then 1 tab for 3 days, then stop   sertraline  100 MG tablet Commonly known as: ZOLOFT  Take 200 mg by mouth daily.   traZODone  50 MG tablet Commonly known as: DESYREL  Take 50 mg by mouth at bedtime as needed.   valsartan -hydrochlorothiazide  160-12.5 MG tablet Commonly known as: DIOVAN -HCT Take 0.5 tablets by mouth daily.   Vitamin D  (Ergocalciferol ) 1.25 MG (50000 UNIT) Caps capsule Commonly known as: DRISDOL Take 50,000 Units by mouth every 7 (seven) days.               Durable Medical Equipment  (From admission, onward)           Start     Ordered   05/20/24 1114  For home use only DME oxygen  Once       Question Answer Comment  Length of Need 6 Months   Mode or (Route) Nasal cannula   Liters per Minute 3   Frequency Continuous (stationary and portable oxygen unit needed)   Oxygen conserving device Yes   Oxygen delivery system Gas      05/20/24 1113              Time coordinating discharge: 39 minutes  Signed:  Henrick Mcgue  [facility] 05/20/2024, 11:32 AM

## 2024-05-20 NOTE — Progress Notes (Signed)
 Discharge mediciations delivered to the patient at the bedside

## 2024-05-20 NOTE — Progress Notes (Signed)
 This RN provided discharge teaching along with a print out of discharge instructions. Pt denies having any questions at this time. Medication upon discharge is en route from pharmacy, and [employer] is at bedside with take home supplemental oxygen.

## 2024-05-20 NOTE — Progress Notes (Signed)
 SATURATION QUALIFICATIONS: (This note is used to comply with regulatory documentation for home oxygen)  Patient Saturations on Room Air at Rest = 88%  Patient Saturations on Room Air while Ambulating = 84%  Patient Saturations on 3 Liters of oxygen while Ambulating = 92%  Please briefly explain why patient needs home oxygen: Although pt denied shortness of breath, pt had increase work of breathing and desatted when ambulating on room air.

## 2024-05-20 NOTE — TOC Transition Note (Signed)
 Transition of Care [facility]) - Discharge Note   Patient Details  Name: Tammy Cowan MRN: [id number] Date of Birth: 05/16/1956  [facility] CM/SW Contact:  Jon ONEIDA Anon, RN Phone Number: 05/20/2024, 12:01 PM   Clinical Narrative:    Pt to discharge home. Pt will need home O2. Home DME O2 orders sent to Jermaine with [employer]. Travel O2 tank to be delivered to pt room prior to DC. There are no further ICM needs at this time.    Final next level of care: Home/Self Care Barriers to Discharge: Barriers Resolved   Patient Goals and CMS Choice Patient states their goals for this hospitalization and ongoing recovery are:: To return home CMS Medicare.gov Compare Post Acute Care list provided to:: Patient Choice offered to / list presented to : Patient [place] ownership interest in [facility].provided to:: Patient    Discharge Placement                       Discharge Plan and Services Additional resources added to the After Visit Summary for   In-house Referral: NA Discharge Planning Services: CM Consult Post Acute Care Choice: NA          DME Arranged: Oxygen DME Agency: [employer] Date DME Agency Contacted: 05/20/24 Time DME Agency Contacted: 1133 Representative spoke with at DME Agency: Jermaine with [employer] HH Arranged: NA HH Agency: NA        Social Drivers of Health (SDOH) Interventions SDOH Screenings   Food Insecurity: No Food Insecurity (05/14/2024)  Housing: Low Risk  (05/14/2024)  Transportation Needs: No Transportation Needs (05/14/2024)  Utilities: Not At Risk (05/14/2024)  Depression (PHQ2-9): Low Risk  (01/15/2024)  Financial Resource Strain: Low Risk  (04/28/2024)   Received from [facility]  Physical Activity: Inactive (04/28/2024)   Received from [facility]  Social Connections: Moderately Isolated (05/14/2024)  Stress: Stress Concern Present (04/28/2024)   [facility]  Tobacco Use: High  Risk (05/13/2024)     Readmission Risk Interventions    05/17/2024    1:21 PM  Readmission Risk Prevention Plan  Post Dischage Appt Complete  Medication Screening Complete  Transportation Screening Complete

## 2024-05-23 ENCOUNTER — Telehealth: Payer: Self-pay | Admitting: *Deleted

## 2024-05-23 NOTE — Telephone Encounter (Signed)
 Called and spoke with patient, she was discharged home from the hospital on 10/17 after having a COPD exacerbation.  The hospitalist told her that if she contacted her pulmonologist they could order her a POC.  She is supposed to go back to work on Thursday 10/23 and only has a home concentrator and tanks right now.  I advised her that she would have to be evaluated in the office and walk on a POC to make sure she qualifies for one.  [employer] is who provided her oxygen at D/C from the hospital.  She is on 3L continuous right now. She hopes she will not be on it long.  I told her she could contact [employer] and see if they do qualifying walks for POCs and see what they say, however I told her I could make her an appointment with us  and we will walk her and if she qualifies we will send in the order and can send it in urgently.  I scheduled her to see Madelin Stank NP on 10/22 at 11:30 am, advised to arrive at 11:15 am for check in.  She verbalized understanding.  Nothing further needed.

## 2024-05-23 NOTE — Telephone Encounter (Signed)
 Copied from CRM [id number]. Topic: Clinical - Order For Equipment >> May 20, 2024  1:45 PM Celestine FALCON wrote: Reason for CRM: Pt is calling after being set to be dc from [facility] today after being admitted on 05/13/24 for COPD exacerbation (HCC). She was recommended at the hospital to have her provider Dr. [name] place an order for a POC.   Pt's phone number is [phone] ok to leave a vm.  Called patient, she was sent home with oxygen at 3L, she was set up with [employer].

## 2024-05-25 ENCOUNTER — Encounter: Payer: Self-pay | Admitting: Adult Health

## 2024-05-25 ENCOUNTER — Ambulatory Visit (INDEPENDENT_AMBULATORY_CARE_PROVIDER_SITE_OTHER): Admitting: Adult Health

## 2024-05-25 VITALS — BP 112/76 | HR 80 | Temp 97.8°F | Ht 61.0 in | Wt 209.6 lb

## 2024-05-25 DIAGNOSIS — I509 Heart failure, unspecified: Secondary | ICD-10-CM

## 2024-05-25 DIAGNOSIS — J441 Chronic obstructive pulmonary disease with (acute) exacerbation: Secondary | ICD-10-CM

## 2024-05-25 DIAGNOSIS — G4710 Hypersomnia, unspecified: Secondary | ICD-10-CM

## 2024-05-25 DIAGNOSIS — J449 Chronic obstructive pulmonary disease, unspecified: Secondary | ICD-10-CM

## 2024-05-25 DIAGNOSIS — Z720 Tobacco use: Secondary | ICD-10-CM

## 2024-05-25 DIAGNOSIS — R0609 Other forms of dyspnea: Secondary | ICD-10-CM

## 2024-05-25 DIAGNOSIS — J9610 Chronic respiratory failure, unspecified whether with hypoxia or hypercapnia: Secondary | ICD-10-CM | POA: Diagnosis not present

## 2024-05-25 DIAGNOSIS — J9611 Chronic respiratory failure with hypoxia: Secondary | ICD-10-CM

## 2024-05-25 DIAGNOSIS — Z8709 Personal history of other diseases of the respiratory system: Secondary | ICD-10-CM

## 2024-05-25 DIAGNOSIS — F17210 Nicotine dependence, cigarettes, uncomplicated: Secondary | ICD-10-CM

## 2024-05-25 DIAGNOSIS — G4719 Other hypersomnia: Secondary | ICD-10-CM

## 2024-05-25 MED ORDER — OHTUVAYRE 3 MG/2.5ML IN SUSP: 1.0000 | Freq: Two times a day (BID) | RESPIRATORY_TRACT | Status: AC

## 2024-05-25 NOTE — Patient Instructions (Addendum)
 Finish prednisone  as directed.  Increase Duoneb Four times a day Add Ohtuvayre  Neb Twice daily  Continue on Oxygen 3 liters l/m with activity and At bedtime.  Order for POC with DME Set up for split night sleep study.  Work on quitting smoking Continue yearly CT chest screening Continue on Nucala .  Follow up with Dr. [name] or Malachy or Jing Howatt NP in 6 weeks and As needed   Please contact office for sooner follow up if symptoms do not improve or worsen or seek emergency care

## 2024-05-25 NOTE — Progress Notes (Unsigned)
 @Patient  ID: Tammy Cowan, female    DOB: 21-May-1956, 68 y.o.   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  History of Present Illness Tammy Cowan is a 68 year old female with COPD and asthma who presents for follow-up after a recent hospitalization for pneumonia.  She was recently hospitalized for a pneumonia  and flare-up of her COPD. She typically uses Breztri  twice a day but has stopped due to thrush prior to admission. She continues to use DuoNeb three to four times a day and Nucala  monthly injections.  Presented to the emergency room with increased shortness of breath cough wheezing and hypoxemia.  Viral panel was positive for rhinovirus.  She was treated with IV antibiotics, steroids and nebulized bronchodilators.  She was discharged on oxygen 3 L with activity and at bedtime.  She needs a portable system for her oxygen so she can use it when she is away from home. She is completing a prednisone  taper and feels better but still limited in activity.  She has a history of smoking and is currently smoking a few cigarettes a day, though she is attempting to quit. 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   Her social history includes significant stress from managing a [employer] facility and caring for her husband, who has alcohol-induced dementia. She has a son who is a smoker and a young grandchild. She received a flu shot during her recent hospitalization.  Recent hospitalization records reviewed -COPD exacerbation complicated by multifocal  pneumonia and acute respiratory failure.  Viral panel was positive for rhinovirus.  Chest x-ray showed bibasilar infiltrates suspicious for multifocal pneumonia.  She initially required high flow oxygen.  She was treated with IV antibiotics, steroids.  She did have a component of volume overload and required diuresis.  She was discharged on a prednisone  taper.    Walk test in office :  Patient Saturations on Room Air at Rest = 98%   Patient Saturations on Room Air while Ambulating = 86%   Patient Saturations on 3 Liters of pulse oxygen while Ambulating = 96%  Allergies  Allergen Reactions   Crestor [Rosuvastatin Calcium ] Diarrhea    Pt states diarrhea for months after taking crestor   Venlafaxine Other (See Comments)    Felt bad on it   Vibegron     Immunization History  Administered Date(s) Administered   INFLUENZA, HIGH DOSE SEASONAL PF 05/20/2024   Moderna Sars-Covid-2 Vaccination 09/27/2019, 10/25/2019   PFIZER Comirnaty(Gray Top)Covid-19 Tri-Sucrose Vaccine 12/20/2020   PFIZER(Purple Top)SARS-COV-2 Vaccination 06/13/2020    Past Medical History:  Diagnosis Date   COPD (chronic obstructive pulmonary disease) (HCC)    Depression    Hypertension     Tobacco History: Social History   Tobacco Use  Smoking Status Every Day   Current packs/day: 1.00   Average packs/day: 1 pack/day for 52.8 years (52.8 ttl pk-yrs)   Types: Cigarettes   Start date: 1973  Smokeless Tobacco Never  Tobacco Comments   10 cigarettes a day or a pack, most days    10 cigarettes or less a  day 05/25/2024    Ready to quit: Not Answered Counseling given: Not Answered Tobacco comments: 10 cigarettes a day or a pack, most days  10 cigarettes or less a day 05/25/2024    Outpatient Medications Prior to Visit  Medication Sig Dispense Refill   albuterol  (VENTOLIN  HFA) 108 (90 Base) MCG/ACT inhaler Inhale 2 puffs into the lungs every 4 (four) hours as needed for wheezing or shortness of breath. 3  each 4   budesonide  (PULMICORT ) 0.5 MG/2ML nebulizer solution Dissolve 2mL in sinus rinse bottle, use twice daily. 30 mL 12   buPROPion (WELLBUTRIN SR) 150 MG 12 hr tablet Take 150 mg by mouth 2 (two) times daily.     busPIRone  (BUSPAR ) 10 MG tablet Take 10 mg by mouth 3 (three) times daily.     ezetimibe (ZETIA) 10 MG tablet Take 10 mg by mouth daily.     fluticasone  (FLONASE ) 50 MCG/ACT nasal spray Place 1 spray into both nostrils in the morning and at bedtime. 16 g 2   guaiFENesin  (MUCINEX ) 600 MG 12 hr tablet Take 1 tablet (600 mg total) by mouth 2 (two) times daily 20 tablet 0   ipratropium (ATROVENT ) 0.06 % nasal spray Use 1 sprays in each nostril up to 4 times daily 15 mL 5   ipratropium-albuterol  (DUONEB) 0.5-2.5 (3) MG/3ML SOLN Take 3 mLs by nebulization every 6 (six) hours as needed.     levocetirizine (XYZAL) 5 MG tablet Take 5 mg by mouth every evening.     Mepolizumab  (NUCALA ) 100 MG/ML SOAJ Inject 100 mg into the skin every 28 (twenty-eight) days.     montelukast  (SINGULAIR ) 10 MG tablet Take one tablet once daily 30 tablet 5   nystatin  (MYCOSTATIN ) 100000 UNIT/ML suspension Take 5 mLs (500,000 Units total) by mouth 4 (four) times daily. 210 mL 0   predniSONE  (DELTASONE ) 10 MG tablet Take 4 tablets by mouth daily for 3 days THEN take 3 tablets daily for 3 days THEN take 2 tablets daily for 3 days THEN take 1 tablet daily for 3 days 30 tablet 0   sertraline  (ZOLOFT ) 100 MG tablet Take 200 mg by mouth daily.     traZODone  (DESYREL ) 50 MG tablet Take 50 mg by mouth at bedtime as needed.     valsartan -hydrochlorothiazide  (DIOVAN -HCT) 160-12.5 MG tablet Take 0.5 tablets by mouth daily.     Vitamin D , Ergocalciferol , (DRISDOL) 1.25 MG (50000 UNIT) CAPS capsule Take 50,000 Units by mouth every 7 (seven) days.     budesonide -glycopyrrolate-formoterol (BREZTRI  AEROSPHERE) 160-9-4.8 MCG/ACT AERO inhaler Inhale 2 puffs into the lungs in the morning and at bedtime. (Patient not taking: Reported  on 05/25/2024) 10.7 g 11   nicotine  (NICODERM CQ  - DOSED IN MG/24 HOURS) 14 mg/24hr patch Place 1 patch (14 mg total) onto the skin daily. (Patient not taking: Reported on 05/25/2024) 28 patch 2   No facility-administered medications prior to visit.     Review of Systems:   Constitutional:   No  weight loss, night sweats,  Fevers, chills, +fatigue, or  lassitude.  HEENT:   No headaches,  Difficulty swallowing,  Tooth/dental problems, or  Sore throat,                No sneezing, itching, ear ache, +nasal congestion, post nasal drip,   CV:  No chest pain,  Orthopnea, PND, swelling in lower extremities, anasarca, dizziness, palpitations, syncope.   GI  No heartburn, indigestion, abdominal pain, nausea, vomiting, diarrhea, change in bowel habits, loss  of appetite, bloody stools.   Resp: .  No chest wall deformity  Skin: no rash or lesions.  GU: no dysuria, change in color of urine, no urgency or frequency.  No flank pain, no hematuria   MS:  No joint pain or swelling.  No decreased range of motion.  No back pain.    Physical Exam   BP 112/76   Pulse 80   Temp 97.8 F (36.6 C) (Oral)   Ht 5' 1 (1.549 m)   Wt 209 lb 9.6 oz (95.1 kg)   SpO2 93% Comment: on RA  BMI 39.60 kg/m   GEN: A/Ox3; pleasant , NAD, well nourished    HEENT:  [place]/AT,  EACs-clear, TMs-wnl, NOSE-clear, THROAT-clear, no lesions, no postnasal drip or exudate noted. Class 3 MP airway   NECK:  Supple w/ fair ROM; no JVD; normal carotid impulses w/o bruits; no thyromegaly or nodules palpated; no lymphadenopathy.    RESP  scattered rhonchi . no accessory muscle use, no dullness to percussion  CARD:  RRR, no m/r/g, tr  peripheral edema, pulses intact, no cyanosis or clubbing.  GI:   Soft & nt; nml bowel sounds; no organomegaly or masses detected.   Musco: Warm bil, no deformities or joint swelling noted.   Neuro: alert, no focal deficits noted.    Skin: Warm, no lesions or rashes    Lab  Results:Reviewed 05/25/2024   CBC    Component Value Date/Time   WBC 14.3 (H) 05/19/2024 0304   RBC 5.37 (H) 05/19/2024 0304   HGB 15.6 (H) 05/19/2024 0304   HCT 47.8 (H) 05/19/2024 0304   PLT 371 05/19/2024 0304   MCV 89.0 05/19/2024 0304   MCH 29.1 05/19/2024 0304   MCHC 32.6 05/19/2024 0304   RDW 12.6 05/19/2024 0304   LYMPHSABS 1.1 05/17/2024 0256   MONOABS 0.4 05/17/2024 0256   EOSABS 0.0 05/17/2024 0256   BASOSABS 0.0 05/17/2024 0256    BMET    Component Value Date/Time   NA 131 (L) 05/19/2024 0304   K 4.5 05/19/2024 0304   CL 91 (L) 05/19/2024 0304   CO2 30 05/19/2024 0304   GLUCOSE 133 (H) 05/19/2024 0304   BUN 19 05/19/2024 0304   CREATININE 0.53 05/19/2024 0304   CALCIUM  9.6 05/19/2024 0304   GFRNONAA >60 05/19/2024 0304    BNP    Component Value Date/Time   BNP 42.6 12/10/2022 1801    ProBNP    Component Value Date/Time   PROBNP 468.0 (H) 05/14/2024 0905    Imaging: DG CHEST PORT 1 VIEW Result Date: 05/16/2024 CLINICAL DATA:  [id number] Pneumonia [id number]. EXAM: PORTABLE CHEST 1 VIEW COMPARISON:  05/13/2024. FINDINGS: Bilateral lung fields are clear. Bilateral costophrenic angles are clear. Normal cardio-mediastinal silhouette. No acute osseous abnormalities. The soft tissues are within normal limits. IMPRESSION: No active disease. Electronically Signed   By: Ree Molt M.D.   On: 05/16/2024 08:52   ECHOCARDIOGRAM COMPLETE Result Date: 05/15/2024    ECHOCARDIOGRAM REPORT   Patient Name:   HYLA COARD Date of Exam: 05/15/2024 Medical Rec #:  [id number]       Height:       61.0 in Accession #:    [id number]      Weight:       215.6 lb Date of Birth:  1956-01-14       BSA:          1.950 m Patient Age:    39 years  BP:           134/76 mmHg Patient Gender: F               HR:           73 bpm. Exam Location:  Inpatient Procedure: 2D Echo, Cardiac Doppler, Color Doppler and Intracardiac            Opacification Agent (Both Spectral and Color Flow  Doppler were            utilized during procedure). Indications:    CHF-Acute Systolic  History:        Patient has no prior history of Echocardiogram examinations.                 COPD, Signs/Symptoms:Shortness of Breath and Hypoxemia; Risk                 Factors:Hypertension, Current Smoker and Dyslipidemia.  Sonographer:    Logan Shove RDCS Referring Phys: [id number] [facility]  Sonographer Comments: Technically difficult study due to poor echo windows. IMPRESSIONS  1. Left ventricular ejection fraction, by estimation, is 60 to 65%. The left ventricle has normal function. The left ventricle has no regional wall motion abnormalities. Left ventricular diastolic parameters were normal.  2. Right ventricular systolic function is normal. The right ventricular size is normal. Tricuspid regurgitation signal is inadequate for assessing PA pressure.  3. Left atrial size was mildly dilated.  4. The mitral valve is grossly normal. Trivial mitral valve regurgitation.  5. The aortic valve was not well visualized. Aortic valve regurgitation is not visualized.  6. The inferior vena cava is dilated in size with >50% respiratory variability, suggesting right atrial pressure of 8 mmHg. Comparison(s): No prior Echocardiogram. FINDINGS  Left Ventricle: Left ventricular ejection fraction, by estimation, is 60 to 65%. The left ventricle has normal function. The left ventricle has no regional wall motion abnormalities. Definity contrast agent was given IV to delineate the left ventricular  endocardial borders. The left ventricular internal cavity size was normal in size. There is borderline left ventricular hypertrophy. Left ventricular diastolic parameters were normal. Right Ventricle: The right ventricular size is normal. No increase in right ventricular wall thickness. Right ventricular systolic function is normal. Tricuspid regurgitation signal is inadequate for assessing PA pressure. Left Atrium: Left atrial size was mildly dilated.  Right Atrium: Right atrial size was normal in size. Pericardium: There is no evidence of pericardial effusion. Presence of epicardial fat layer. Mitral Valve: The mitral valve is grossly normal. Trivial mitral valve regurgitation. Tricuspid Valve: The tricuspid valve is grossly normal. Tricuspid valve regurgitation is trivial. Aortic Valve: The aortic valve was not well visualized. Aortic valve regurgitation is not visualized. Aortic valve peak gradient measures 5.8 mmHg. Pulmonic Valve: The pulmonic valve was grossly normal. Pulmonic valve regurgitation is trivial. Aorta: The aortic root and ascending aorta are structurally normal, with no evidence of dilitation. Venous: The inferior vena cava is dilated in size with greater than 50% respiratory variability, suggesting right atrial pressure of 8 mmHg. IAS/Shunts: No atrial level shunt detected by color flow Doppler. Additional Comments: 3D was performed not requiring image post processing on an independent workstation and was indeterminate.  LEFT VENTRICLE PLAX 2D LVIDd:         4.50 cm      Diastology LVIDs:         3.50 cm      LV e' medial:    7.29 cm/s LV PW:  0.80 cm      LV E/e' medial:  12.2 LV IVS:        1.10 cm      LV e' lateral:   9.46 cm/s LVOT diam:     2.00 cm      LV E/e' lateral: 9.4 LVOT Area:     3.14 cm  LV Volumes (MOD) LV vol d, MOD A2C: 132.0 ml LV vol d, MOD A4C: 123.0 ml LV vol s, MOD A2C: 60.2 ml LV vol s, MOD A4C: 54.2 ml LV SV MOD A2C:     71.8 ml LV SV MOD A4C:     123.0 ml LV SV MOD BP:      69.3 ml RIGHT VENTRICLE             IVC RV Basal diam:  4.00 cm     IVC diam: 2.70 cm RV Mid diam:    2.00 cm RV S prime:     12.10 cm/s TAPSE (M-mode): 2.3 cm LEFT ATRIUM             Index        RIGHT ATRIUM           Index LA diam:        3.10 cm 1.59 cm/m   RA Area:     15.60 cm LA Vol (A2C):   75.8 ml 38.87 ml/m  RA Volume:   41.40 ml  21.23 ml/m LA Vol (A4C):   73.1 ml 37.49 ml/m LA Biplane Vol: 77.3 ml 39.64 ml/m  AORTIC VALVE  AV Area (Vmax): 2.62 cm AV Vmax:        120.00 cm/s AV Peak Grad:   5.8 mmHg LVOT Vmax:      100.00 cm/s  AORTA Ao Root diam: 2.90 cm Ao Asc diam:  2.90 cm MITRAL VALVE MV Area (PHT): 3.77 cm    SHUNTS MV Decel Time: 201 msec    Systemic Diam: 2.00 cm MV E velocity: 88.60 cm/s MV A velocity: 92.70 cm/s MV E/A ratio:  0.96 Jayson Sierras MD Electronically signed by Jayson Sierras MD Signature Date/Time: 05/15/2024/11:50:33 AM    Final    DG Chest Port 1 View Result Date: 05/13/2024 CLINICAL DATA:  Chronic cough and shortness of breath. EXAM: PORTABLE CHEST 1 VIEW COMPARISON:  None 12/22/2023 FINDINGS: Cardiac enlargement. Emphysematous changes in the lungs. Increasing infiltration in the lung bases may represent multifocal pneumonia or less likely edema. No pleural effusion or pneumothorax. Mediastinal contours appear intact. Calcification of the aorta. IMPRESSION: Increasing infiltrates in the lung bases likely to represent pneumonia. Cardiac enlargement. Emphysematous changes in the lungs. Electronically Signed   By: Elsie Gravely M.D.   On: 05/13/2024 23:59   DG Chest Port 1 View Result Date: 04/28/2024 EXAM: 1 VIEW(S) XRAY OF THE CHEST 04/28/2024 02:16:27 PM COMPARISON: 12/09/2023 CLINICAL HISTORY: sob. COPD exacerbation FINDINGS: LUNGS AND PLEURA: Bibasilar atelectasis. Bilateral coarsened interstitial markings. No pulmonary edema. No pleural effusion. No pneumothorax. HEART AND MEDIASTINUM: Aortic arch calcifications. No acute abnormality of the cardiac and mediastinal silhouettes. BONES AND SOFT TISSUES: No acute osseous abnormality. IMPRESSION: 1. Bibasilar atelectasis. 2. Bilateral coarsened interstitial markings. Electronically signed by: Waddell Calk MD 04/28/2024 02:54 PM EDT RP Workstation: [id number]    Administration History     None          Latest Ref Rng & Units 05/11/2023   10:52 AM  PFT Results  FVC-Pre L 2.17   FVC-Predicted Pre % 73  FVC-Post L 2.32    FVC-Predicted Post % 78   Pre FEV1/FVC % % 54   Post FEV1/FCV % % 53   FEV1-Pre L 1.16   FEV1-Predicted Pre % 51   FEV1-Post L 1.24   DLCO uncorrected ml/min/mmHg 10.11   DLCO UNC% % 54   DLCO corrected ml/min/mmHg 10.11   DLCO COR %Predicted % 54   DLVA Predicted % 59   TLC L 5.17   TLC % Predicted % 108   RV % Predicted % 144     No results found for: NITRICOXIDE      No data to display              Assessment & Plan:   Assessment and Plan Assessment & Plan Chronic obstructive pulmonary disease (COPD) with recent exacerbation   COPD exacerbation was due to pneumonia,  triggered by rhinovirus.  Clinically improved after antibiotics, steroids, nebulized bronchodilators and diuresis Continued smoking contributes to exacerbations. Increase DuoNeb to four times daily and add Ohtuvayre  nebulizer twice daily.  Unable to take ICS currently, would consider adding budesonide  nebulizer on return.  Smoking cessation is key continue on Nucala . Finish prednisone  taper. Follow up in six weeks.  Possible CAP -clinically improved after antibiotics.  Follow-up chest x-ray on May 16, 2024 showed clearance of pneumonia  Nicotine  dependence (active tobacco use)   Active tobacco use contributes to COPD exacerbations and pneumonia. Smoking cessation is critical to prevent further lung damage. Stress and family dynamics challenge cessation efforts. Continue smoking cessation with Wellbutrin and nicotine  patches. Avoid flavored vaping  due to potential lung irritation.  Suspected obstructive sleep apnea   Suspected obstructive sleep apnea is indicated by daytime fatigue/sleepiness  and low energy. Airway examination suggests possible sleep apnea, with potential oxygen level drops during sleep exacerbating COPD and asthma symptoms. Order a split night sleep study at [facility] sleep center.  Congestive heart failure continue on current regimen.  Appears stable and euvolemic on  exam.  Lung cancer screening (annual low-dose CT)   She is part of the lung cancer CT chest screening program. Recent pneumonia has resolved on follow-up chest X-ray. Continue annual low-dose CT for lung cancer screening.  Chronic respiratory failure continue on oxygen to maintain O2 saturations greater than 88 to 90%-order for POC device sent to DME  Plan  Patient Instructions  Finish prednisone  as directed.  Increase Duoneb Four times a day Add Ohtuvayre  Neb Twice daily  Continue on Oxygen 3 liters l/m with activity and At bedtime.  Order for POC with DME Set up for split night sleep study.  Work on quitting smoking Continue yearly CT chest screening Continue on Nucala .  Follow up with Dr. [name] or Malachy or Kynslie Ringle NP in 6 weeks and As needed   Please contact office for sooner follow up if symptoms do not improve or worsen or seek emergency care         Marna Weniger, NP 05/25/2024  I spent  45  minutes dedicated to the care of this patient on the date of this encounter to include pre-visit review of records, face-to-face time with the patient discussing conditions above, post visit ordering of testing, clinical documentation with the electronic health record, making appropriate referrals as documented, and communicating necessary findings to members of the patients care team.

## 2024-05-27 ENCOUNTER — Telehealth: Payer: Self-pay

## 2024-05-27 NOTE — Telephone Encounter (Signed)
 Received Ohtuvayre  new start paperwork. Completed form and faxed with clinicals and insurance card copy to [facility]   Phone#: [phone] Fax#: [phone]

## 2024-05-31 NOTE — Telephone Encounter (Signed)
 Received fax from [facility] confirming receipt of rx.

## 2024-06-02 ENCOUNTER — Other Ambulatory Visit (HOSPITAL_COMMUNITY): Payer: Self-pay

## 2024-06-02 NOTE — Telephone Encounter (Signed)
 Received fax from [employer] that a pa has been started for Ohtuvayre . Submitted a Prior Authorization request to [facility] [employer]/RX [employer]VANCE for OHTUVAYRE  via CoverMyMeds. Will update once we receive a response.  Key: [id number]

## 2024-06-02 NOTE — Telephone Encounter (Signed)
 Received notification from [employer] regarding a prior authorization for OHTUVAYRE . Authorization has been APPROVED from 06/02/24 to 08/03/25. Approval letter sent to scan center.  Per test claim, copay for 30 days supply is $565.74  Authorization # [id number] Phone # [phone]

## 2024-06-24 NOTE — Telephone Encounter (Signed)
 Patient enroled into asthma grant through [employer]: Amount; $2000 Award Period: 12/27/2023 - 06/24/2025 ID: [id number] BIN: [id number] PCN: [id number] Group: [id number] For pharmacy inquiries, contact PDMI at [phone]. For patient inquiries, contact [employer] at [phone].  MyChart message sent ot patient. Patient can fill with [facility] after 08/04/2024. She should continue filling Nucala  through [employer] through end of 2025 calendar year

## 2024-07-06 ENCOUNTER — Ambulatory Visit: Admitting: Nurse Practitioner

## 2024-07-06 ENCOUNTER — Encounter: Payer: Self-pay | Admitting: Nurse Practitioner

## 2024-07-06 ENCOUNTER — Ambulatory Visit (INDEPENDENT_AMBULATORY_CARE_PROVIDER_SITE_OTHER)

## 2024-07-06 ENCOUNTER — Telehealth: Payer: Self-pay | Admitting: Nurse Practitioner

## 2024-07-06 VITALS — BP 125/76 | HR 85 | Temp 98.0°F | Ht 61.0 in | Wt 217.0 lb

## 2024-07-06 DIAGNOSIS — Z720 Tobacco use: Secondary | ICD-10-CM | POA: Diagnosis not present

## 2024-07-06 DIAGNOSIS — J9601 Acute respiratory failure with hypoxia: Secondary | ICD-10-CM | POA: Diagnosis not present

## 2024-07-06 DIAGNOSIS — J069 Acute upper respiratory infection, unspecified: Secondary | ICD-10-CM

## 2024-07-06 DIAGNOSIS — J449 Chronic obstructive pulmonary disease, unspecified: Secondary | ICD-10-CM

## 2024-07-06 DIAGNOSIS — J441 Chronic obstructive pulmonary disease with (acute) exacerbation: Secondary | ICD-10-CM

## 2024-07-06 LAB — POCT INFLUENZA A/B
Influenza A, POC: NEGATIVE
Influenza B, POC: NEGATIVE

## 2024-07-06 LAB — POC COVID19 BINAXNOW: SARS Coronavirus 2 Ag: NEGATIVE

## 2024-07-06 MED ORDER — ALBUTEROL SULFATE (2.5 MG/3ML) 0.083% IN NEBU
2.5000 mg | INHALATION_SOLUTION | Freq: Four times a day (QID) | RESPIRATORY_TRACT | Status: AC
  Administered 2024-07-06: 2.5 mg via RESPIRATORY_TRACT

## 2024-07-06 MED ORDER — PREDNISONE 10 MG PO TABS: ORAL_TABLET | ORAL | 0 refills | Status: AC

## 2024-07-06 NOTE — Assessment & Plan Note (Signed)
 Moderate COPD with acute exacerbation; possibly related to URI. COVID and flu testing negative. Likely exacerbated by smoking. Will treat her with prednisone  taper. Hold on antimicrobial therapy as no significant sputum production. Obtain CXR to rule out superimposed infection. Continue aggressive maintenance regimen. Smoking cessation strongly advised. Action plan in place.   Patient Instructions  Continue Albuterol  inhaler 2 puffs or 3 mL neb every 6 hours as needed for shortness of breath or wheezing. Notify if symptoms persist despite rescue inhaler/neb use.  Continue Breztri  2 puffs Twice daily. Brush tongue and rinse mouth afterwards. Use with spacer Continue nasal sprays as prescribed Continue current allergy pills Continue Nucala  injections every 4 weeks for management of your COPD Continue Ohtuvayre  nebs    Use your breathing treatments 2-3 times a day until symptoms improve Prednisone  taper. 4 tabs for 2 days, then 3 tabs for 2 days, 2 tabs for 2 days, then 1 tab for 2 days, then stop. Take in AM with food Delsym 2 tsp Twice daily over the counter as needed for cough  Chest x ray today  COVID and flu negative  Work on quitting smoking again    Follow up in Dr. [name] in 4-6 weeks. If symptoms do not improve or worsen, please contact office for sooner follow up or seek emergency care

## 2024-07-06 NOTE — Progress Notes (Signed)
 @Patient  ID: Tammy Cowan, female    DOB: Apr 01, 1956, 68 y.o.   MRN: [id number]  Chief Complaint  Patient presents with   Medical Management of Chronic Issues   Emphysema    Increased SOB since started back smoking again a wk ago. She is wheezing and has increased cough- mainly dry, occ produces clear sputum.     Referring provider: [name], [name], Cowan  HPI: 68 year old female, active smoker followed for COPD with emphysema. She is a patient of Tammy Cowan and last seen in office 05/25/2024 by Tammy Cowan. Past medical history significant for HTN, HLD, allergic rhinitis.   TEST/EVENTS:  12/2022 eos 200  05/11/2023 PFT: FVC 73, FEV1 51, ratio 53, TLC 108, DLCO 54. No BD. Severe obstruction without reversibility and severe diffusion defect  12/09/2023 CXR: both lungs clear   08/12/2023: OV with Tammy Cowan. In the past, Breo made throat sore. Tried Breztri . Seemed to help but some issues with tongue discomfort. Used Stiolto in the interim. Never used this. She met with her PCP; diagnosed with thrush. Placed on mouthwash. Uses this with every dose of Breztri . Has resumed Breztri . Breathing is better.   12/09/2023: Tammy Cowan with Tammy Duke Cowan  Tammy Cowan is a 68 year old female with moderate to severe COPD who presents with a flare-up of respiratory symptoms. She has been experiencing exacerbations of her COPD symptoms since her last visit in January. She saw her PCP yesterday for flare-up, she was started on azithromycin , a steroid shot, and a five-day course of prednisone . She always improves with steroid courses but then gets worse as soon as she comes off. Currently, she has a productive cough with clear sputum, but no fever, chills, or hemoptysis. Still wheezing. She restarted Breztri  two weeks ago. She had been off inhalers prior to this. She sometimes uses nystatin  mouthwash to manage thrush symptoms, with improvement. No issues with thrush today.  She has a history of smoking and is attempting  to quit, having previously used Chantix successfully for two months. She's been off Chantix now but may considered resuming. She is concerned about weight gain, attributing it to prolonged prednisone  use, and reports gaining approximately forty pounds over the last year. She has a history of COVID-19 in 2022, RSV in 2023, and pneumonia requiring hospitalization last year.  She reports chronic sinus issues with clear nasal discharge and frequent headaches over the past two months, which she attributes to allergies. She uses Astelin  nasal spray, flonase , Zyrtec . No significant leg swelling, but occasional ankle puffiness is noted with steroid use.  Her current medications include Breztri , DuoNebs. She has not used a breathing treatment today due to work interruptions. She feels better in the mornings but experiences increased difficulty breathing as the day progresses. FeNO 18 ppb   01/11/2024: Today - follow up Discussed the use of AI scribe software for clinical note transcription with the patient, who gave verbal consent to proceed. Tammy Cowan is a 68 year old female with COPD who presents with worsening respiratory symptoms. Her respiratory symptoms have worsened over the last week. She was feeling better after her last visit and completing steroids. She experiences wheezing and is coughing up mostly clear sputum. Has more issues with her activity tolerance due to her breathing. No fever, chills, or hemoptysis. She has a history of frequent steroid use this year, which she feels has contributed to weight gain. She is frustrated with the frequent need for steroids, noting they cause her to  feel 'puffy'. She mentions experiencing a burning sensation on her tongue and the roof of her mouth, which she has been managing with baking soda. She uses a spacer with her Breztri  inhaler most of the time, taking two puffs in the morning and evening. She also uses her nebulizer treatment twice a day. Her rescue  inhaler, either Ventolin  or albuterol , is used every morning and evening. She is still smoking. High stress at home; doesn't feel she can quit at this point.  She has a long history of smoking, having started at age 17 or 85. She smoked up to two and a half packs a day during stressful periods, such as when her husband first got sick with alcohol-induced dementia. Currently, she is trying to limit her smoking to half a pack a day, though she finds it challenging, especially on weekends when she is at home.     02/03/2024: Tammy Cowan with Tammy Masden Cowan Tammy Cowan is a 68 year old female with COPD who presents for follow-up regarding her Nucala  treatment. She is currently undergoing treatment for COPD and has recently started on Nucala , an injectable medication, which we prescribed at her last visit. She actually received her first dose on June 20th at her allergist's office since they had samples without any adverse reactions. The pharmacy is arranging home delivery of the medication. She has a history of allergies, particularly to cats, but does not own any. She has a mild allergy to dogs, but no allergy shots have been recommended. She is participating in pulmonary rehabilitation, which she finds challenging to attend due to her job, but she does feel like it is helping.  She still has some congestion still but cough is clear.  She experiences a burning sensation in her mouth, which she attributes to thrush. She uses nystatin  and apple cider vinegar with baking soda to manage these symptoms but still persists. She uses Mucinex , Flonase , and saline rinses to manage her sinus symptoms.     07/06/2024: Today - follow up Discussed the use of AI scribe software for clinical note transcription with the patient, who gave verbal consent to proceed.  History of Present Illness Tammy Cowan is a 68 year old female who presents with a persistent cough and wheezing.  She has a persistent cough that has not produced a  significant amount of sputum. She relapsed in smoking over Thanksgiving and the cough started after. She was also around a lot of people. No hemoptysis, fevers, chills.no known sick exposures.   She experiences wheezing and some mild increased shortness of breath, but overall, not feeling too bad. She denies body aches. She last performed a breathing treatment this morning, which provided some relief, but has not had the opportunity to perform another treatment since then.  She was hospitalized in October and was started on oxygen therapy, which she does not find significantly helpful so she's not wearing much. She does wear it at night. She did start Ohtuvayre  since her last visit and has felt like this is helping some. She is still using her Breztri  twice daily and her Nucala  injections. She did feel she was doing better up until this last week.   She mentions that her insurance, Medicare, does not cover smoking cessation patches, which her previous insurance did, and expresses frustration about this.  She has not had her in lab study yet. Scheduled end of this month.     Allergies  Allergen Reactions   Crestor [Rosuvastatin Calcium ] Diarrhea  Pt states diarrhea for months after taking crestor   Venlafaxine Other (See Comments)    Felt bad on it   Vibegron     Immunization History  Administered Date(s) Administered   INFLUENZA, HIGH DOSE SEASONAL PF 05/20/2024   Moderna Sars-Covid-2 Vaccination 09/27/2019, 10/25/2019   PFIZER Comirnaty(Gray Top)Covid-19 Tri-Sucrose Vaccine 12/20/2020   PFIZER(Purple Top)SARS-COV-2 Vaccination 06/13/2020    Past Medical History:  Diagnosis Date   COPD (chronic obstructive pulmonary disease) (HCC)    Depression    Hypertension     Tobacco History: Social History   Tobacco Use  Smoking Status Every Day   Current packs/day: 1.00   Average packs/day: 1 pack/day for 52.9 years (52.9 ttl pk-yrs)   Types: Cigarettes   Start date: 1973   Smokeless Tobacco Never  Tobacco Comments   10 cigarettes a day or a pack, most days    10 cigarettes or less a day 05/25/2024    Ready to quit: Not Answered Counseling given: Not Answered Tobacco comments: 10 cigarettes a day or a pack, most days  10 cigarettes or less a day 05/25/2024    Outpatient Medications Prior to Visit  Medication Sig Dispense Refill   albuterol  (VENTOLIN  HFA) 108 (90 Base) MCG/ACT inhaler Inhale 2 puffs into the lungs every 4 (four) hours as needed for wheezing or shortness of breath. 3 each 4   budesonide  (PULMICORT ) 0.5 MG/2ML nebulizer solution Dissolve 2mL in sinus rinse bottle, use twice daily. 30 mL 12   buPROPion  (WELLBUTRIN  SR) 150 MG 12 hr tablet Take 150 mg by mouth 2 (two) times daily.     busPIRone  (BUSPAR ) 10 MG tablet Take 10 mg by mouth 3 (three) times daily.     Ensifentrine  (OHTUVAYRE ) 3 MG/2.5ML SUSP Inhale 1 Inhalation into the lungs 2 (two) times daily.     ezetimibe  (ZETIA ) 10 MG tablet Take 10 mg by mouth daily.     fluticasone  (FLONASE ) 50 MCG/ACT nasal spray Place 1 spray into both nostrils in the morning and at bedtime. 16 g 2   ipratropium (ATROVENT ) 0.06 % nasal spray Use 1 sprays in each nostril up to 4 times daily 15 mL 5   ipratropium-albuterol  (DUONEB) 0.5-2.5 (3) MG/3ML SOLN Take 3 mLs by nebulization in the morning, at noon, in the evening, and at bedtime.     levocetirizine (XYZAL ) 5 MG tablet Take 5 mg by mouth every evening.     Mepolizumab  (NUCALA ) 100 MG/ML SOAJ Inject 100 mg into the skin every 28 (twenty-eight) days.     montelukast  (SINGULAIR ) 10 MG tablet Take one tablet once daily 30 tablet 5   sertraline  (ZOLOFT ) 100 MG tablet Take 200 mg by mouth daily.     traZODone  (DESYREL ) 50 MG tablet Take 50 mg by mouth at bedtime as needed.     valsartan -hydrochlorothiazide  (DIOVAN -HCT) 160-12.5 MG tablet Take 0.5 tablets by mouth daily.     Vitamin D , Ergocalciferol , (DRISDOL ) 1.25 MG (50000 UNIT) CAPS capsule Take 50,000  Units by mouth every 7 (seven) days.     nicotine  (NICODERM CQ  - DOSED IN MG/24 HOURS) 14 mg/24hr patch Place 1 patch (14 mg total) onto the skin daily. (Patient not taking: Reported on 07/06/2024) 28 patch 2   nystatin  (MYCOSTATIN ) 100000 UNIT/ML suspension Take 5 mLs (500,000 Units total) by mouth 4 (four) times daily. (Patient not taking: Reported on 07/06/2024) 210 mL 0   predniSONE  (DELTASONE ) 10 MG tablet Take 4 tablets by mouth daily for 3 days THEN take 3  tablets daily for 3 days THEN take 2 tablets daily for 3 days THEN take 1 tablet daily for 3 days 30 tablet 0   No facility-administered medications prior to visit.     Review of Systems: as above    Physical Exam:  BP 125/76   Pulse 85   Temp 98 F (36.7 C) (Oral)   Ht 5' 1 (1.549 m)   Wt 217 lb (98.4 kg)   SpO2 92% Comment: on RA  BMI 41.00 kg/m   GEN: Pleasant, interactive, well-kempt; obese; in no acute distress HEENT:  Normocephalic and atraumatic. PERRLA. Sclera white. Nasal turbinates pink, moist and patent bilaterally. No rhinorrhea present. Oropharynx pink and moist, without exudate or edema. NECK:  Supple w/ fair ROM. No JVD present. No lymphadenopathy.   CV: RRR, no m/r/g, no peripheral edema. Pulses intact, +2 bilaterally. No cyanosis, pallor or clubbing. PULMONARY:  Unlabored, regular breathing. Mild bibasilar rhonchi otherwise clear b/l A&P. No accessory muscle use.  GI: BS present and normoactive. Soft, non-tender to palpation.  MSK: No erythema, warmth or tenderness. Cap refil <2 sec all extrem.  Neuro: A/Ox3. No focal deficits noted.   Skin: Warm, no lesions or rashe Psych: Normal affect and behavior. Judgement and thought content appropriate.     Lab Results:  CBC    Component Value Date/Time   WBC 14.3 (H) 05/19/2024 0304   RBC 5.37 (H) 05/19/2024 0304   HGB 15.6 (H) 05/19/2024 0304   HCT 47.8 (H) 05/19/2024 0304   PLT 371 05/19/2024 0304   MCV 89.0 05/19/2024 0304   MCH 29.1 05/19/2024 0304    MCHC 32.6 05/19/2024 0304   RDW 12.6 05/19/2024 0304   LYMPHSABS 1.1 05/17/2024 0256   MONOABS 0.4 05/17/2024 0256   EOSABS 0.0 05/17/2024 0256   BASOSABS 0.0 05/17/2024 0256    BMET    Component Value Date/Time   NA 131 (L) 05/19/2024 0304   K 4.5 05/19/2024 0304   CL 91 (L) 05/19/2024 0304   CO2 30 05/19/2024 0304   GLUCOSE 133 (H) 05/19/2024 0304   BUN 19 05/19/2024 0304   CREATININE 0.53 05/19/2024 0304   CALCIUM  9.6 05/19/2024 0304   GFRNONAA >60 05/19/2024 0304    BNP    Component Value Date/Time   BNP 42.6 12/10/2022 1801     Imaging:  DG Chest 2 View Result Date: 07/06/2024 EXAM: 2 VIEW(S) XRAY OF THE CHEST 07/06/2024 02:26:47 PM COMPARISON: 05/16/2024 CLINICAL HISTORY: AECOPD FINDINGS: LUNGS AND PLEURA: Lungs are hypoinflated with mild bibasilar linear density likely atelectasis. No lobar consolidation. No pleural effusion. possible mild vascular congestion. No pneumothorax. HEART AND MEDIASTINUM: Calcified aorta. The cardiomediastinal silhouette is otherwise unchanged. BONES AND SOFT TISSUES: No acute osseous abnormality. IMPRESSION: 1. Hypoinflated lungs with mild bibasilar linear density, likely atelectasis. 2. Possible mild vascular congestion. Electronically signed by: Toribio Agreste MD 07/06/2024 03:14 PM EST RP Workstation: [id number]     albuterol  (PROVENTIL ) (2.5 MG/3ML) 0.083% nebulizer solution 2.5 mg     Date Action Dose Route User   07/06/2024 1404 Given 2.5 mg Nebulization Issac Greig HERO, CMA          Latest Ref Rng & Units 05/11/2023   10:52 AM  PFT Results  FVC-Pre L 2.17   FVC-Predicted Pre % 73   FVC-Post L 2.32   FVC-Predicted Post % 78   Pre FEV1/FVC % % 54   Post FEV1/FCV % % 53   FEV1-Pre L 1.16   FEV1-Predicted Pre % 51  FEV1-Post L 1.24   DLCO uncorrected ml/min/mmHg 10.11   DLCO UNC% % 54   DLCO corrected ml/min/mmHg 10.11   DLCO COR %Predicted % 54   DLVA Predicted % 59   TLC L 5.17   TLC % Predicted % 108   RV %  Predicted % 144     No results found for: NITRICOXIDE      Assessment & Plan:   COPD exacerbation (HCC) Moderate COPD with acute exacerbation; possibly related to URI. COVID and flu testing negative. Likely exacerbated by smoking. Will treat her with prednisone  taper. Hold on antimicrobial therapy as no significant sputum production. Obtain CXR to rule out superimposed infection. Continue aggressive maintenance regimen. Smoking cessation strongly advised. Action plan in place.   Patient Instructions  Continue Albuterol  inhaler 2 puffs or 3 mL neb every 6 hours as needed for shortness of breath or wheezing. Notify if symptoms persist despite rescue inhaler/neb use.  Continue Breztri  2 puffs Twice daily. Brush tongue and rinse mouth afterwards. Use with spacer Continue nasal sprays as prescribed Continue current allergy pills Continue Nucala  injections every 4 weeks for management of your COPD Continue Ohtuvayre  nebs    Use your breathing treatments 2-3 times a day until symptoms improve Prednisone  taper. 4 tabs for 2 days, then 3 tabs for 2 days, 2 tabs for 2 days, then 1 tab for 2 days, then stop. Take in AM with food Delsym 2 tsp Twice daily over the counter as needed for cough  Chest x ray today  COVID and flu negative  Work on quitting smoking again    Follow up in Tammy Cowan in 4-6 weeks. If symptoms do not improve or worsen, please contact office for sooner follow up or seek emergency care   Acute hypoxemic respiratory failure (HCC) No hypoxia on room air today. Continue to monitor at home for goal >88-90%. Awaiting sleep study to rule out OSA as contributing factor to nocturnal hypoxia. Safe driving practices reviewed.   Tobacco use Smoking cessation advised. Continue with lung cancer screening program.      Advised if symptoms do not improve or worsen, to please contact office for sooner follow up or seek emergency care.   I spent 35 minutes of dedicated to  the care of this patient on the date of this encounter to include pre-visit review of records, face-to-face time with the patient discussing conditions above, post visit ordering of testing, clinical documentation with the electronic health record, making appropriate referrals as documented, and communicating necessary findings to members of the patients care team.  Comer LULLA Rouleau, Cowan 07/06/2024  Pt aware and understands Cowan's role.

## 2024-07-06 NOTE — Patient Instructions (Addendum)
 Continue Albuterol  inhaler 2 puffs or 3 mL neb every 6 hours as needed for shortness of breath or wheezing. Notify if symptoms persist despite rescue inhaler/neb use.  Continue Breztri  2 puffs Twice daily. Brush tongue and rinse mouth afterwards. Use with spacer Continue nasal sprays as prescribed Continue current allergy pills Continue Nucala  injections every 4 weeks for management of your COPD Continue Ohtuvayre  nebs    Use your breathing treatments 2-3 times a day until symptoms improve Prednisone  taper. 4 tabs for 2 days, then 3 tabs for 2 days, 2 tabs for 2 days, then 1 tab for 2 days, then stop. Take in AM with food Delsym 2 tsp Twice daily over the counter as needed for cough  Chest x ray today  COVID and flu negative  Work on quitting smoking again    Follow up in Dr. [name] in 4-6 weeks. If symptoms do not improve or worsen, please contact office for sooner follow up or seek emergency care

## 2024-07-06 NOTE — Telephone Encounter (Signed)
 Submitted a Prior Authorization request to [facility] for NUCALA  via CoverMyMeds. Will update once we receive a response.  Key: [id number]

## 2024-07-06 NOTE — Assessment & Plan Note (Signed)
 No hypoxia on room air today. Continue to monitor at home for goal >88-90%. Awaiting sleep study to rule out OSA as contributing factor to nocturnal hypoxia. Safe driving practices reviewed.

## 2024-07-06 NOTE — Telephone Encounter (Signed)
 Will you please check and see if we can get her Nucala  approved for 2026? She says her ins needs this. Thanks!

## 2024-07-06 NOTE — Assessment & Plan Note (Signed)
 Smoking cessation advised. Continue with lung cancer screening program.

## 2024-07-06 NOTE — Telephone Encounter (Deleted)
 Pt enrolled in Asthma grant, she will start filling our specialty pharmacy in the new year and the grant will cover her copay. We messaged pt and told her this under medication management encounter on 10/15.

## 2024-07-07 ENCOUNTER — Ambulatory Visit: Payer: Self-pay | Admitting: Nurse Practitioner

## 2024-07-07 NOTE — Progress Notes (Signed)
 No acute infectious process. Continue plan as discussed. If symptoms worsen or fail to improve, call us  for sooner appt. Thanks.

## 2024-07-08 NOTE — Progress Notes (Signed)
 Called and left her a detailed msg with results ok per [facility]

## 2024-07-12 NOTE — Telephone Encounter (Signed)
 Received fax from [facility] requesting additional info to complete review for initial coverage. Completed form and faxed with clinicals and labs to [facility]. Will update when we receive a response.  Phone #: [phone] Fax #: [phone]

## 2024-07-12 NOTE — Telephone Encounter (Signed)
 Error

## 2024-07-13 ENCOUNTER — Other Ambulatory Visit (HOSPITAL_COMMUNITY): Payer: Self-pay

## 2024-07-14 NOTE — Telephone Encounter (Signed)
 Received notification from [facility] regarding a prior authorization for NUCALA . Authorization has been APPROVED from 07/06/2024 to 07/06/2025. Approval letter sent to scan center.  Authorization # [id number] Phone # [phone]

## 2024-07-14 NOTE — Telephone Encounter (Signed)
 Pt was enrolled in grant, will not need PAP for 2026.

## 2024-07-15 ENCOUNTER — Ambulatory Visit: Admitting: Internal Medicine

## 2024-07-27 ENCOUNTER — Other Ambulatory Visit: Payer: Self-pay | Admitting: Internal Medicine

## 2024-08-01 ENCOUNTER — Ambulatory Visit (HOSPITAL_BASED_OUTPATIENT_CLINIC_OR_DEPARTMENT_OTHER): Admitting: Pulmonary Disease

## 2024-08-12 ENCOUNTER — Ambulatory Visit: Admitting: Internal Medicine

## 2024-08-24 NOTE — Telephone Encounter (Signed)
 ATC patient at [phone] to discuss. Left detailed HIPAA compliant VM to return call.

## 2024-08-26 ENCOUNTER — Ambulatory Visit: Attending: Pulmonary Disease

## 2024-08-26 ENCOUNTER — Other Ambulatory Visit: Payer: Self-pay

## 2024-08-26 ENCOUNTER — Other Ambulatory Visit (HOSPITAL_COMMUNITY): Payer: Self-pay

## 2024-08-26 DIAGNOSIS — J449 Chronic obstructive pulmonary disease, unspecified: Secondary | ICD-10-CM

## 2024-08-26 MED ORDER — NUCALA 100 MG/ML ~~LOC~~ SOAJ: 100.0000 mg | SUBCUTANEOUS | 3 refills | Status: AC

## 2024-08-26 NOTE — Progress Notes (Signed)
 [facility]  Referring Provider: Dr. Harden Staff  Virtual Visit via Telephone Note  I connected with Tammy Cowan on 08/26/24 at 11:30 AM EST by telephone and verified that I am speaking with the correct person using two identifiers.  Location: Patient: home Provider: office   I discussed the limitations, risks, security and privacy concerns of performing an evaluation and management service by telephone and the availability of in person appointments. I also discussed with the patient that there may be a patient responsible charge related to this service. The patient expressed understanding and agreed to proceed.  HPI: Tammy Cowan is a 69 y.o. female who presents to the pharmacotherapy clinic via telephone for onboarding to [facility] for future fills of Nucala .   Patient Active Problem List   Diagnosis Date Noted   COPD exacerbation (HCC) 05/14/2024   Oral thrush 02/03/2024   Allergic rhinitis 12/10/2023   Hyponatremia 12/11/2022   Dyslipidemia 12/11/2022   Depression 12/11/2022   Essential hypertension 12/11/2022   Acute hypoxemic respiratory failure (HCC) 12/11/2022   COPD, moderate (HCC) 12/11/2022   Low serum cortisol level 12/11/2022   Tobacco use 12/11/2022   Multifocal pneumonia 12/10/2022    Patient's Medications  New Prescriptions   No medications on file  Previous Medications   ALBUTEROL  (VENTOLIN  HFA) 108 (90 BASE) MCG/ACT INHALER    Inhale 2 puffs into the lungs every 4 (four) hours as needed for wheezing or shortness of breath.   BUDESONIDE  (PULMICORT ) 0.5 MG/2ML NEBULIZER SOLUTION    Dissolve 2mL in sinus rinse bottle, use twice daily.   BUPROPION  (WELLBUTRIN  SR) 150 MG 12 HR TABLET    Take 150 mg by mouth 2 (two) times daily.   BUSPIRONE  (BUSPAR ) 10 MG TABLET    Take 10 mg by mouth 3 (three) times daily.   ENSIFENTRINE  (OHTUVAYRE ) 3 MG/2.5ML SUSP    Inhale 1 Inhalation into the lungs 2 (two) times daily.    EZETIMIBE  (ZETIA ) 10 MG TABLET    Take 10 mg by mouth daily.   FLUTICASONE  (FLONASE ) 50 MCG/ACT NASAL SPRAY    Place 1 spray into both nostrils in the morning and at bedtime.   IPRATROPIUM (ATROVENT ) 0.06 % NASAL SPRAY    Use 1 sprays in each nostril up to 4 times daily   IPRATROPIUM-ALBUTEROL  (DUONEB) 0.5-2.5 (3) MG/3ML SOLN    Take 3 mLs by nebulization in the morning, at noon, in the evening, and at bedtime.   LEVOCETIRIZINE (XYZAL ) 5 MG TABLET    Take 5 mg by mouth every evening.   MEPOLIZUMAB  (NUCALA ) 100 MG/ML SOAJ    Inject 100 mg into the skin every 28 (twenty-eight) days.   MONTELUKAST  (SINGULAIR ) 10 MG TABLET    Take 1 tablet by mouth once daily   NICOTINE  (NICODERM CQ  - DOSED IN MG/24 HOURS) 14 MG/24HR PATCH    Place 1 patch (14 mg total) onto the skin daily.   NYSTATIN  (MYCOSTATIN ) 100000 UNIT/ML SUSPENSION    Take 5 mLs (500,000 Units total) by mouth 4 (four) times daily.   PREDNISONE  (DELTASONE ) 10 MG TABLET    4 tabs for 2 days, then 3 tabs for 2 days, 2 tabs for 2 days, then 1 tab for 2 days, then stop   SERTRALINE  (ZOLOFT ) 100 MG TABLET    Take 200 mg by mouth daily.   TRAZODONE  (DESYREL ) 50 MG TABLET    Take 50 mg by mouth at bedtime as needed.   VALSARTAN -HYDROCHLOROTHIAZIDE  (DIOVAN -HCT)  160-12.5 MG TABLET    Take 0.5 tablets by mouth daily.   VITAMIN D , ERGOCALCIFEROL , (DRISDOL ) 1.25 MG (50000 UNIT) CAPS CAPSULE    Take 50,000 Units by mouth every 7 (seven) days.  Modified Medications   No medications on file  Discontinued Medications   No medications on file    Allergies: Allergies[1]  Past Medical History: Past Medical History:  Diagnosis Date   COPD (chronic obstructive pulmonary disease) (HCC)    Depression    Hypertension     Social History: Social History   Socioeconomic History   Marital status: Married    Spouse name: Not on file   Number of children: 2   Years of education: Not on file   Highest education level: Some college, no degree   Occupational History   Not on file  Tobacco Use   Smoking status: Every Day    Current packs/day: 1.00    Average packs/day: 1 pack/day for 53.1 years (53.1 ttl pk-yrs)    Types: Cigarettes    Start date: 1973   Smokeless tobacco: Never   Tobacco comments:    10 cigarettes a day or a pack, most days     10 cigarettes or less a day 05/25/2024   Substance and Sexual Activity   Alcohol use: Not Currently   Drug use: No   Sexual activity: Not on file  Other Topics Concern   Not on file  Social History Narrative   Not on file   Social Drivers of Health   Tobacco Use: High Risk (07/06/2024)   Patient History    Smoking Tobacco Use: Every Day    Smokeless Tobacco Use: Never    Passive Exposure: Not on file  Financial Resource Strain: Low Risk (04/28/2024)   Received from [facility]   Overall Financial Resource Strain (CARDIA)    How hard is it for you to pay for the very basics like food, housing, medical care, and heating?: Not hard at all  Food Insecurity: No Food Insecurity (05/14/2024)   Epic    Worried About Programme Researcher, Broadcasting/film/video in the Last Year: Never true    Ran Out of Food in the Last Year: Never true  Transportation Needs: No Transportation Needs (05/14/2024)   Epic    Lack of Transportation (Medical): No    Lack of Transportation (Non-Medical): No  Physical Activity: Inactive (04/28/2024)   Received from [facility]   Exercise Vital Sign    On average, how many days per week do you engage in moderate to strenuous exercise (like a brisk walk)?: 0 days    Minutes of Exercise per Session: Not on file  Stress: Stress Concern Present (04/28/2024)   Received from [facility] - Occupational Stress Questionnaire    Do you feel stress - tense, restless, nervous, or anxious, or unable to sleep at night because your mind is troubled all the time - these days?: Very much  Social Connections: Moderately Isolated (05/14/2024)    Social Connection and Isolation Panel    Frequency of Communication with Friends and Family: More than three times a week    Frequency of Social Gatherings with Friends and Family: More than three times a week    Attends Religious Services: Never    Database Administrator or Organizations: No    Attends Banker Meetings: Never    Marital Status: Married  Depression (PHQ2-9): Low Risk (01/15/2024)   Depression (PHQ2-9)  PHQ-2 Score: 2  Alcohol Screen: Not on file  Housing: Low Risk (05/14/2024)   Epic    Unable to Pay for Housing in the Last Year: No    Number of Times Moved in the Last Year: 0    Homeless in the Last Year: No  Utilities: Not At Risk (05/14/2024)   Epic    Threatened with loss of utilities: No  Health Literacy: Not on file    Medication: Nucala  - CONTINUATION OF THERAPY  Goals of therapy: Mechanism of Action: Not fully understood. It does act an interleukin-5 (IL-5) antagonist monoclonal antibody that reduces the production and survival of eosinophils by blocking the binding of IL-5 to the alpha chain of the receptor complex on the eosinophil cell surface. Reviewed that Nucala  is add-on medication and patient must continue maintenance inhaler regimen. Response to therapy: may take 3 months to 6 months to determine efficacy. Discussed that patients generally feel improvement sooner than 3 months.  Side effects: headache (19%), injection site reaction (7-15%), antibody development (6%), backache (5%), fatigue (5%)  Dose: 100 mg subcutaneously every 4 weeks  Administration/Storage:  Reviewed administration sites of thigh or abdomen (at least 2-3 inches away from abdomen). Reviewed the upper arm is only appropriate if caregiver is administering injection  Do not shake the reconstituted solution as this could lead to product foaming or precipitation. Solution should be clear to opalescent and colorless to pale yellow or pale brown, essentially particle  free. Small air bubbles, however, are expected and acceptable. If particulate matter remains in the solution or if the solution appears cloudy or milky, discard the solution.  Reviewed storage of medication in refrigerator. Reviewed that Nucala  can be stored at room temperature in unopened carton for up to 7 days.  Access: Approval of Nucala  through: insurance and grant foundation  Medication Reconciliation  A drug regimen assessment was performed, including review of allergies, interactions, disease-state management, dosing and immunization history. Medications were reviewed with the patient, including name, instructions, indication, goals of therapy, potential side effects, importance of adherence, and safe use.  Plan: - CONTINUE Nucala  100mg  [place] every 28 days  - Rx triaged to [facility].   I discussed the assessment and treatment plan with the patient. The patient was provided an opportunity to ask questions and all were answered. The patient agreed with the plan and demonstrated an understanding of the instructions.   The patient was advised to call back or seek an in-person evaluation if the symptoms worsen or if the condition fails to improve as anticipated.  I provided 10 minutes of non-face-to-face time during this encounter.  Aleck Puls, PharmD, BCPS, CPP Clinical Pharmacist  [facility]  [facility]       [1]  Allergies Allergen Reactions   Crestor [Rosuvastatin Calcium ] Diarrhea    Pt states diarrhea for months after taking crestor   Venlafaxine Other (See Comments)    Felt bad on it   Vibegron

## 2024-08-26 NOTE — Addendum Note (Signed)
 Addended by: Aamirah Salmi L on: 08/26/2024 11:51 AM   Modules accepted: Orders

## 2024-08-26 NOTE — Telephone Encounter (Signed)
 See pharmacotherapy note 08/26/24 - Rx triaged to [facility].

## 2024-08-26 NOTE — Telephone Encounter (Signed)
 Second ATC patient at [phone], LVMTCB.   Tried calling work number, but only got busy tone.

## 2024-09-06 ENCOUNTER — Other Ambulatory Visit: Payer: Self-pay | Admitting: Internal Medicine

## 2024-09-06 NOTE — Progress Notes (Unsigned)
 I connected with Tammy Cowan on 08/26/24 at 11:30 AM EST by telephone to discuss ongoing Nucala  use and education. Previously filled through patient assistance. Continues Nucala  for COPD. Complete education provided prior to first dispense at [facility], see note 08/26/24.

## 2024-09-08 ENCOUNTER — Other Ambulatory Visit (HOSPITAL_COMMUNITY): Payer: Self-pay

## 2024-09-09 ENCOUNTER — Other Ambulatory Visit (HOSPITAL_COMMUNITY): Payer: Self-pay

## 2024-10-05 ENCOUNTER — Ambulatory Visit (HOSPITAL_BASED_OUTPATIENT_CLINIC_OR_DEPARTMENT_OTHER): Admitting: Pulmonary Disease
